# Patient Record
Sex: Male | Born: 1935 | Race: White | Hispanic: No | Marital: Married | Smoking: Former smoker
Health system: Southern US, Community
[De-identification: ages and names within clinical notes are randomized; demographics above are authoritative.]

## PROBLEM LIST (undated history)

## (undated) DIAGNOSIS — C801 Malignant (primary) neoplasm, unspecified: Secondary | ICD-10-CM

## (undated) DIAGNOSIS — L409 Psoriasis, unspecified: Secondary | ICD-10-CM

## (undated) DIAGNOSIS — R0600 Dyspnea, unspecified: Secondary | ICD-10-CM

## (undated) DIAGNOSIS — F0390 Unspecified dementia without behavioral disturbance: Secondary | ICD-10-CM

## (undated) DIAGNOSIS — J189 Pneumonia, unspecified organism: Secondary | ICD-10-CM

## (undated) DIAGNOSIS — E119 Type 2 diabetes mellitus without complications: Secondary | ICD-10-CM

## (undated) DIAGNOSIS — A159 Respiratory tuberculosis unspecified: Secondary | ICD-10-CM

## (undated) HISTORY — PX: CATARACT EXTRACTION: SUR2

## (undated) HISTORY — DX: Malignant (primary) neoplasm, unspecified: C80.1

---

## 2005-02-05 ENCOUNTER — Ambulatory Visit: Payer: Self-pay | Admitting: Gastroenterology

## 2005-02-05 LAB — HM COLONOSCOPY

## 2011-06-19 DIAGNOSIS — Z0000 Encounter for general adult medical examination without abnormal findings: Secondary | ICD-10-CM | POA: Diagnosis not present

## 2011-06-19 DIAGNOSIS — Z23 Encounter for immunization: Secondary | ICD-10-CM | POA: Diagnosis not present

## 2011-06-19 DIAGNOSIS — M129 Arthropathy, unspecified: Secondary | ICD-10-CM | POA: Diagnosis not present

## 2011-06-19 DIAGNOSIS — R5381 Other malaise: Secondary | ICD-10-CM | POA: Diagnosis not present

## 2011-06-19 DIAGNOSIS — Z1339 Encounter for screening examination for other mental health and behavioral disorders: Secondary | ICD-10-CM | POA: Diagnosis not present

## 2011-06-19 DIAGNOSIS — R5383 Other fatigue: Secondary | ICD-10-CM | POA: Diagnosis not present

## 2011-06-19 DIAGNOSIS — Z1331 Encounter for screening for depression: Secondary | ICD-10-CM | POA: Diagnosis not present

## 2011-08-08 DIAGNOSIS — G3184 Mild cognitive impairment, so stated: Secondary | ICD-10-CM | POA: Diagnosis not present

## 2011-08-08 DIAGNOSIS — R5383 Other fatigue: Secondary | ICD-10-CM | POA: Diagnosis not present

## 2011-08-08 DIAGNOSIS — R05 Cough: Secondary | ICD-10-CM | POA: Diagnosis not present

## 2011-08-08 DIAGNOSIS — J209 Acute bronchitis, unspecified: Secondary | ICD-10-CM | POA: Diagnosis not present

## 2011-08-08 DIAGNOSIS — R5381 Other malaise: Secondary | ICD-10-CM | POA: Diagnosis not present

## 2011-12-17 DIAGNOSIS — M129 Arthropathy, unspecified: Secondary | ICD-10-CM | POA: Diagnosis not present

## 2011-12-17 DIAGNOSIS — E559 Vitamin D deficiency, unspecified: Secondary | ICD-10-CM | POA: Diagnosis not present

## 2011-12-17 DIAGNOSIS — R5381 Other malaise: Secondary | ICD-10-CM | POA: Diagnosis not present

## 2011-12-17 DIAGNOSIS — G3184 Mild cognitive impairment, so stated: Secondary | ICD-10-CM | POA: Diagnosis not present

## 2011-12-17 DIAGNOSIS — R5383 Other fatigue: Secondary | ICD-10-CM | POA: Diagnosis not present

## 2012-01-03 DIAGNOSIS — R5381 Other malaise: Secondary | ICD-10-CM | POA: Diagnosis not present

## 2012-01-03 DIAGNOSIS — G3184 Mild cognitive impairment, so stated: Secondary | ICD-10-CM | POA: Diagnosis not present

## 2012-01-03 DIAGNOSIS — E119 Type 2 diabetes mellitus without complications: Secondary | ICD-10-CM | POA: Diagnosis not present

## 2012-01-03 DIAGNOSIS — Z23 Encounter for immunization: Secondary | ICD-10-CM | POA: Diagnosis not present

## 2012-01-03 DIAGNOSIS — E559 Vitamin D deficiency, unspecified: Secondary | ICD-10-CM | POA: Diagnosis not present

## 2012-01-28 ENCOUNTER — Ambulatory Visit: Payer: Self-pay | Admitting: Family Medicine

## 2012-01-28 DIAGNOSIS — Z7189 Other specified counseling: Secondary | ICD-10-CM | POA: Diagnosis not present

## 2012-01-28 DIAGNOSIS — E119 Type 2 diabetes mellitus without complications: Secondary | ICD-10-CM | POA: Diagnosis not present

## 2012-01-30 DIAGNOSIS — E119 Type 2 diabetes mellitus without complications: Secondary | ICD-10-CM | POA: Diagnosis not present

## 2012-02-01 ENCOUNTER — Ambulatory Visit: Payer: Self-pay | Admitting: Family Medicine

## 2012-03-03 ENCOUNTER — Ambulatory Visit: Payer: Self-pay | Admitting: Family Medicine

## 2012-03-03 DIAGNOSIS — Z7189 Other specified counseling: Secondary | ICD-10-CM | POA: Diagnosis not present

## 2012-03-03 DIAGNOSIS — E119 Type 2 diabetes mellitus without complications: Secondary | ICD-10-CM | POA: Diagnosis not present

## 2012-04-02 ENCOUNTER — Ambulatory Visit: Payer: Self-pay | Admitting: Family Medicine

## 2012-04-28 DIAGNOSIS — N400 Enlarged prostate without lower urinary tract symptoms: Secondary | ICD-10-CM | POA: Diagnosis not present

## 2012-04-28 DIAGNOSIS — E559 Vitamin D deficiency, unspecified: Secondary | ICD-10-CM | POA: Diagnosis not present

## 2012-04-28 DIAGNOSIS — R5381 Other malaise: Secondary | ICD-10-CM | POA: Diagnosis not present

## 2012-04-28 DIAGNOSIS — E119 Type 2 diabetes mellitus without complications: Secondary | ICD-10-CM | POA: Diagnosis not present

## 2012-04-28 DIAGNOSIS — E7800 Pure hypercholesterolemia, unspecified: Secondary | ICD-10-CM | POA: Diagnosis not present

## 2012-06-24 DIAGNOSIS — H11449 Conjunctival cysts, unspecified eye: Secondary | ICD-10-CM | POA: Diagnosis not present

## 2012-06-26 DIAGNOSIS — H11449 Conjunctival cysts, unspecified eye: Secondary | ICD-10-CM | POA: Diagnosis not present

## 2012-07-03 DIAGNOSIS — H11449 Conjunctival cysts, unspecified eye: Secondary | ICD-10-CM | POA: Diagnosis not present

## 2012-09-26 DIAGNOSIS — H11449 Conjunctival cysts, unspecified eye: Secondary | ICD-10-CM | POA: Diagnosis not present

## 2012-10-07 ENCOUNTER — Ambulatory Visit: Payer: Self-pay | Admitting: Ophthalmology

## 2012-10-07 ENCOUNTER — Other Ambulatory Visit: Payer: Self-pay | Admitting: Ophthalmology

## 2012-10-07 DIAGNOSIS — Z01810 Encounter for preprocedural cardiovascular examination: Secondary | ICD-10-CM | POA: Diagnosis not present

## 2012-10-07 DIAGNOSIS — Z029 Encounter for administrative examinations, unspecified: Secondary | ICD-10-CM | POA: Diagnosis not present

## 2012-10-07 DIAGNOSIS — I10 Essential (primary) hypertension: Secondary | ICD-10-CM | POA: Diagnosis not present

## 2012-10-09 DIAGNOSIS — Z1339 Encounter for screening examination for other mental health and behavioral disorders: Secondary | ICD-10-CM | POA: Diagnosis not present

## 2012-10-09 DIAGNOSIS — E119 Type 2 diabetes mellitus without complications: Secondary | ICD-10-CM | POA: Diagnosis not present

## 2012-10-09 DIAGNOSIS — N400 Enlarged prostate without lower urinary tract symptoms: Secondary | ICD-10-CM | POA: Diagnosis not present

## 2012-10-09 DIAGNOSIS — Z1331 Encounter for screening for depression: Secondary | ICD-10-CM | POA: Diagnosis not present

## 2012-10-09 DIAGNOSIS — R5381 Other malaise: Secondary | ICD-10-CM | POA: Diagnosis not present

## 2012-10-09 DIAGNOSIS — Z0000 Encounter for general adult medical examination without abnormal findings: Secondary | ICD-10-CM | POA: Diagnosis not present

## 2012-10-29 DIAGNOSIS — H11449 Conjunctival cysts, unspecified eye: Secondary | ICD-10-CM | POA: Diagnosis not present

## 2012-10-30 DIAGNOSIS — H11449 Conjunctival cysts, unspecified eye: Secondary | ICD-10-CM | POA: Diagnosis not present

## 2012-12-31 DIAGNOSIS — H2510 Age-related nuclear cataract, unspecified eye: Secondary | ICD-10-CM | POA: Diagnosis not present

## 2013-01-14 DIAGNOSIS — R7309 Other abnormal glucose: Secondary | ICD-10-CM | POA: Diagnosis not present

## 2013-01-14 DIAGNOSIS — E119 Type 2 diabetes mellitus without complications: Secondary | ICD-10-CM | POA: Diagnosis not present

## 2013-01-14 DIAGNOSIS — F0280 Dementia in other diseases classified elsewhere without behavioral disturbance: Secondary | ICD-10-CM | POA: Diagnosis not present

## 2013-01-14 DIAGNOSIS — Z1331 Encounter for screening for depression: Secondary | ICD-10-CM | POA: Diagnosis not present

## 2013-01-14 DIAGNOSIS — Z23 Encounter for immunization: Secondary | ICD-10-CM | POA: Diagnosis not present

## 2013-07-15 DIAGNOSIS — E7800 Pure hypercholesterolemia, unspecified: Secondary | ICD-10-CM | POA: Diagnosis not present

## 2013-07-15 DIAGNOSIS — E039 Hypothyroidism, unspecified: Secondary | ICD-10-CM | POA: Diagnosis not present

## 2013-07-15 DIAGNOSIS — G3184 Mild cognitive impairment, so stated: Secondary | ICD-10-CM | POA: Diagnosis not present

## 2013-07-15 DIAGNOSIS — E119 Type 2 diabetes mellitus without complications: Secondary | ICD-10-CM | POA: Diagnosis not present

## 2013-07-15 DIAGNOSIS — M129 Arthropathy, unspecified: Secondary | ICD-10-CM | POA: Diagnosis not present

## 2013-07-15 DIAGNOSIS — Z23 Encounter for immunization: Secondary | ICD-10-CM | POA: Diagnosis not present

## 2013-07-15 DIAGNOSIS — E559 Vitamin D deficiency, unspecified: Secondary | ICD-10-CM | POA: Diagnosis not present

## 2013-07-15 LAB — TSH: TSH: 2.61 u[IU]/mL (ref 0.41–5.90)

## 2013-07-15 LAB — LIPID PANEL
Cholesterol: 175 mg/dL (ref 0–200)
HDL: 36 mg/dL (ref 35–70)
LDL CALC: 96 mg/dL
LDl/HDL Ratio: 2.7
TRIGLYCERIDES: 217 mg/dL — AB (ref 40–160)

## 2013-07-15 LAB — CBC AND DIFFERENTIAL
HCT: 44 % (ref 41–53)
HEMOGLOBIN: 15.1 g/dL (ref 13.5–17.5)
NEUTROS ABS: 6 /uL
PLATELETS: 230 10*3/uL (ref 150–399)
WBC: 8.9 10*3/mL

## 2013-10-16 DIAGNOSIS — E039 Hypothyroidism, unspecified: Secondary | ICD-10-CM | POA: Diagnosis not present

## 2013-10-16 DIAGNOSIS — J069 Acute upper respiratory infection, unspecified: Secondary | ICD-10-CM | POA: Diagnosis not present

## 2013-10-16 DIAGNOSIS — M129 Arthropathy, unspecified: Secondary | ICD-10-CM | POA: Diagnosis not present

## 2013-10-16 DIAGNOSIS — G3184 Mild cognitive impairment, so stated: Secondary | ICD-10-CM | POA: Diagnosis not present

## 2013-10-16 DIAGNOSIS — Z23 Encounter for immunization: Secondary | ICD-10-CM | POA: Diagnosis not present

## 2013-10-16 DIAGNOSIS — E559 Vitamin D deficiency, unspecified: Secondary | ICD-10-CM | POA: Diagnosis not present

## 2013-11-16 DIAGNOSIS — E559 Vitamin D deficiency, unspecified: Secondary | ICD-10-CM | POA: Diagnosis not present

## 2013-11-16 DIAGNOSIS — E119 Type 2 diabetes mellitus without complications: Secondary | ICD-10-CM | POA: Diagnosis not present

## 2013-11-16 DIAGNOSIS — Z23 Encounter for immunization: Secondary | ICD-10-CM | POA: Diagnosis not present

## 2013-11-16 DIAGNOSIS — M129 Arthropathy, unspecified: Secondary | ICD-10-CM | POA: Diagnosis not present

## 2013-11-16 DIAGNOSIS — E039 Hypothyroidism, unspecified: Secondary | ICD-10-CM | POA: Diagnosis not present

## 2013-11-16 DIAGNOSIS — G3184 Mild cognitive impairment, so stated: Secondary | ICD-10-CM | POA: Diagnosis not present

## 2013-12-29 DIAGNOSIS — E119 Type 2 diabetes mellitus without complications: Secondary | ICD-10-CM | POA: Diagnosis not present

## 2013-12-29 DIAGNOSIS — M129 Arthropathy, unspecified: Secondary | ICD-10-CM | POA: Diagnosis not present

## 2013-12-29 DIAGNOSIS — E039 Hypothyroidism, unspecified: Secondary | ICD-10-CM | POA: Diagnosis not present

## 2013-12-29 DIAGNOSIS — Z23 Encounter for immunization: Secondary | ICD-10-CM | POA: Diagnosis not present

## 2013-12-29 DIAGNOSIS — G3184 Mild cognitive impairment, so stated: Secondary | ICD-10-CM | POA: Diagnosis not present

## 2013-12-29 DIAGNOSIS — E559 Vitamin D deficiency, unspecified: Secondary | ICD-10-CM | POA: Diagnosis not present

## 2014-02-04 DIAGNOSIS — J40 Bronchitis, not specified as acute or chronic: Secondary | ICD-10-CM | POA: Diagnosis not present

## 2014-03-18 DIAGNOSIS — Z23 Encounter for immunization: Secondary | ICD-10-CM | POA: Diagnosis not present

## 2014-03-18 DIAGNOSIS — Z1389 Encounter for screening for other disorder: Secondary | ICD-10-CM | POA: Diagnosis not present

## 2014-03-18 DIAGNOSIS — Z0000 Encounter for general adult medical examination without abnormal findings: Secondary | ICD-10-CM | POA: Diagnosis not present

## 2014-04-15 DIAGNOSIS — H10812 Pingueculitis, left eye: Secondary | ICD-10-CM | POA: Diagnosis not present

## 2014-06-14 DIAGNOSIS — G3184 Mild cognitive impairment, so stated: Secondary | ICD-10-CM | POA: Diagnosis not present

## 2014-06-14 DIAGNOSIS — E669 Obesity, unspecified: Secondary | ICD-10-CM | POA: Diagnosis not present

## 2014-06-14 DIAGNOSIS — Z1389 Encounter for screening for other disorder: Secondary | ICD-10-CM | POA: Diagnosis not present

## 2014-06-14 DIAGNOSIS — E119 Type 2 diabetes mellitus without complications: Secondary | ICD-10-CM | POA: Diagnosis not present

## 2014-06-14 LAB — HEPATIC FUNCTION PANEL
ALT: 21 U/L (ref 10–40)
AST: 19 U/L (ref 14–40)
Alkaline Phosphatase: 51 U/L (ref 25–125)

## 2014-06-14 LAB — BASIC METABOLIC PANEL
BUN: 20 mg/dL (ref 4–21)
Creatinine: 1.2 mg/dL (ref 0.6–1.3)
Glucose: 210 mg/dL
Potassium: 5.5 mmol/L — AB (ref 3.4–5.3)
Sodium: 142 mmol/L (ref 137–147)

## 2014-06-14 LAB — HEMOGLOBIN A1C: HEMOGLOBIN A1C: 7.2 % — AB (ref 4.0–6.0)

## 2014-06-16 DIAGNOSIS — Z1389 Encounter for screening for other disorder: Secondary | ICD-10-CM | POA: Diagnosis not present

## 2014-06-16 DIAGNOSIS — E669 Obesity, unspecified: Secondary | ICD-10-CM | POA: Diagnosis not present

## 2014-06-16 DIAGNOSIS — L0390 Cellulitis, unspecified: Secondary | ICD-10-CM | POA: Diagnosis not present

## 2014-06-25 DIAGNOSIS — S00512A Abrasion of oral cavity, initial encounter: Secondary | ICD-10-CM | POA: Diagnosis not present

## 2014-06-25 DIAGNOSIS — Z1389 Encounter for screening for other disorder: Secondary | ICD-10-CM | POA: Diagnosis not present

## 2014-06-25 DIAGNOSIS — E669 Obesity, unspecified: Secondary | ICD-10-CM | POA: Diagnosis not present

## 2014-08-18 DIAGNOSIS — E119 Type 2 diabetes mellitus without complications: Secondary | ICD-10-CM | POA: Diagnosis not present

## 2014-12-05 ENCOUNTER — Other Ambulatory Visit: Payer: Self-pay | Admitting: Family Medicine

## 2014-12-09 DIAGNOSIS — E7800 Pure hypercholesterolemia, unspecified: Secondary | ICD-10-CM | POA: Insufficient documentation

## 2014-12-09 DIAGNOSIS — E039 Hypothyroidism, unspecified: Secondary | ICD-10-CM | POA: Insufficient documentation

## 2014-12-09 DIAGNOSIS — F0280 Dementia in other diseases classified elsewhere without behavioral disturbance: Secondary | ICD-10-CM | POA: Insufficient documentation

## 2014-12-09 DIAGNOSIS — M1990 Unspecified osteoarthritis, unspecified site: Secondary | ICD-10-CM | POA: Insufficient documentation

## 2014-12-09 DIAGNOSIS — H409 Unspecified glaucoma: Secondary | ICD-10-CM | POA: Insufficient documentation

## 2014-12-09 DIAGNOSIS — D126 Benign neoplasm of colon, unspecified: Secondary | ICD-10-CM | POA: Insufficient documentation

## 2014-12-09 DIAGNOSIS — G309 Alzheimer's disease, unspecified: Secondary | ICD-10-CM

## 2014-12-09 DIAGNOSIS — E559 Vitamin D deficiency, unspecified: Secondary | ICD-10-CM | POA: Insufficient documentation

## 2014-12-09 DIAGNOSIS — E119 Type 2 diabetes mellitus without complications: Secondary | ICD-10-CM | POA: Insufficient documentation

## 2014-12-09 DIAGNOSIS — N400 Enlarged prostate without lower urinary tract symptoms: Secondary | ICD-10-CM | POA: Insufficient documentation

## 2014-12-09 DIAGNOSIS — E669 Obesity, unspecified: Secondary | ICD-10-CM | POA: Insufficient documentation

## 2014-12-13 ENCOUNTER — Ambulatory Visit (INDEPENDENT_AMBULATORY_CARE_PROVIDER_SITE_OTHER): Payer: Medicare Other | Admitting: Family Medicine

## 2014-12-13 ENCOUNTER — Encounter: Payer: Self-pay | Admitting: Family Medicine

## 2014-12-13 VITALS — BP 122/64 | HR 78 | Temp 97.8°F | Resp 16 | Wt 203.0 lb

## 2014-12-13 DIAGNOSIS — G309 Alzheimer's disease, unspecified: Secondary | ICD-10-CM

## 2014-12-13 DIAGNOSIS — Z23 Encounter for immunization: Secondary | ICD-10-CM | POA: Diagnosis not present

## 2014-12-13 DIAGNOSIS — F0280 Dementia in other diseases classified elsewhere without behavioral disturbance: Secondary | ICD-10-CM

## 2014-12-13 DIAGNOSIS — E119 Type 2 diabetes mellitus without complications: Secondary | ICD-10-CM

## 2014-12-13 LAB — POCT GLYCOSYLATED HEMOGLOBIN (HGB A1C): Hemoglobin A1C: 8.5

## 2014-12-13 NOTE — Progress Notes (Signed)
Patient ID: Joel Grant, male   DOB: 08/01/1926, 81 y.o.   MRN: [id number]    Subjective:  HPI  Diabetes Mellitus Type II, Follow-up:   Lab Results  Component Value Date   HGBA1C 7.2* 06/14/2014    Last seen for diabetes 6 months ago.  Management since then includes none. He reports good compliance with treatment. He is not having side effects.  Current symptoms include none. Home blood sugar records: have been running in the 200's the past couple of weeks.  Episodes of hypoglycemia? no   Current Insulin Regimen: n/a Last eye exam: Within the last year Current exercise: none  Pertinent Labs:    Component Value Date/Time   CHOL 175 07/15/2013   TRIG 217* 07/15/2013   CREATININE 1.2 06/14/2014    Wt Readings from Last 3 Encounters:  12/13/14 203 lb (92.08 kg)  06/25/14 206 lb (93.441 kg)    ------------------------------------------------------------------------  Pt wife reports that his memory is about the same as it was 6 months ago. When doing the [place] pt did not only not remember the name and street address, but he did not even remember me telling him one. His last CIT6 was done on 03/18/14 and he scored a 14, today he scored a 22.   Cognitive Testing - 6-CIT  Correct? Score   What year is it? no 4 0 or 4  What month is it? no 3 0 or 3  Memorize:    Joel Grant,  44,  [place],      What time is it? (within 1 hour) yes 0 0 or 3  Count backwards from 20 no 2 0, 2, or 4  Name the months of the year no 3 0, 2, or 4  Repeat name & address above no 10 0, 2, 4, 6, 8, or 10       TOTAL SCORE  22/28   Interpretation:  Abnormal  Normal (0-7) Abnormal (8-28)      Prior to Admission medications   Medication Sig Start Date End Date Taking? Authorizing Provider  glucose blood (FREESTYLE LITE) test strip FREESTYLE LITE TEST (In Vitro Strip)  1 (one) Strip Strip to check sugars  two times daily for 0 days  Quantity: 100;  Refills: 12   Ordered  :03-Jun-2014  Margarita Rana MD;  Started 03-Jun-2014 Active Comments: QS 30 days supply DX E11.9 06/03/14  Yes Historical Provider, MD    Patient Active Problem List   Diagnosis Date Noted  . Adenomatous colon polyp 12/09/2014  . Alzheimer's dementia 12/09/2014  . 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 Diabetes 12/09/2014  . Glaucoma 12/09/2014  . Adult hypothyroidism 12/09/2014  . Adiposity 12/09/2014  . Arthritis, degenerative 12/09/2014  . Hypercholesterolemia without hypertriglyceridemia 12/09/2014  . Avitaminosis D 12/09/2014    History reviewed. No pertinent past medical history.  Social History   Social History  . Marital Status: Married    Spouse Name: N/A  . Number of Children: N/A  . Years of Education: N/A   Occupational History  . Not on file.   Social History Main Topics  . Smoking status: Former Research scientist (life sciences)  . Smokeless tobacco: Not on file  . Alcohol Use: No  . Drug Use: No  . Sexual Activity: Not on file   Other Topics Concern  . Not on file   Social History Narrative    Allergies  Allergen Reactions  . Ciprofloxacin Anaphylaxis  . Hydrocodone-Acetaminophen Nausea And Vomiting  .  Penicillins   . Sulfa Antibiotics     Review of Systems  Constitutional: Negative.   HENT: Negative.   Eyes: Negative.   Respiratory: Negative.   Cardiovascular: Negative.   Gastrointestinal: Negative.   Genitourinary: Negative.   Musculoskeletal: Negative.   Skin: Negative.   Neurological: Negative.   Endo/Heme/Allergies: Negative.   Psychiatric/Behavioral: Positive for memory loss.    Immunization History  Administered Date(s) Administered  . Pneumococcal Conjugate-13 03/18/2014  . Pneumococcal Polysaccharide-23 01/14/2013  . Td 11/02/2002   Objective:  BP 122/64 mmHg  Pulse 78  Temp(Src) 97.8 F (36.6 C) (Oral)  Resp 16  Wt 203 lb (92.08 kg)  Physical Exam  Constitutional: He is oriented to person, place, and time and well-developed,  well-nourished, and in no distress.  HENT:  Head: Normocephalic and atraumatic.  Right Ear: External ear normal.  Left Ear: External ear normal.  Nose: Nose normal.  Eyes: Conjunctivae are normal.  Neck: Neck supple.  Cardiovascular: Normal rate, regular rhythm and normal heart sounds.   Pulmonary/Chest: Effort normal and breath sounds normal.  Abdominal: Soft.  Neurological: He is alert and oriented to person, place, and time.  Skin: Skin is warm and dry.  Psychiatric: Mood, memory, affect and judgment normal.    Lab Results  Component Value Date   WBC 8.9 07/15/2013   HGB 15.1 07/15/2013   HCT 44 07/15/2013   PLT 230 07/15/2013   CHOL 175 07/15/2013   TRIG 217* 07/15/2013   HDL 36 07/15/2013   LDLCALC 96 07/15/2013   TSH 2.61 07/15/2013   HGBA1C 7.2* 06/14/2014    CMP     Component Value Date/Time   NA 142 06/14/2014   K 5.5* 06/14/2014   BUN 20 06/14/2014   CREATININE 1.2 06/14/2014   AST 19 06/14/2014   ALT 21 06/14/2014   ALKPHOS 51 06/14/2014    Assessment and Plan :  1. Type 2 diabetes mellitus without complication At 81 years old with good of impairment, early dementia goal A1c is less than 8.5 but mainly we have a goal to avoid hypoglycemia. This is explained to patient and to his wife. - POCT HgB A1C--8.5 today  2. Alzheimer's dementia Progressive. On 6-CIT day patient's scores 22/28 which indicates significant dementia. I have told the patient and his wife he is not to drive anymore. He just went to the [facility] driver's license. If I find out he is driving I will call the DMV. I'm most concerned about its executive functioning which is not good. Ambulation reflexes today are fine. His understanding of situation is getting worse. More than 50% of today's visit is spent in counseling regarding these conditions. 3. Need for influenza vaccination  - Flu vaccine HIGH DOSE PF   Miguel Aschoff MD [facility] 12/13/2014  8:39 AM

## 2014-12-20 ENCOUNTER — Other Ambulatory Visit: Payer: Self-pay

## 2014-12-20 MED ORDER — METFORMIN HCL 500 MG PO TABS: 500.0000 mg | ORAL_TABLET | Freq: Every day | ORAL | Status: DC

## 2015-03-14 ENCOUNTER — Ambulatory Visit (INDEPENDENT_AMBULATORY_CARE_PROVIDER_SITE_OTHER): Payer: Medicare Other | Admitting: Family Medicine

## 2015-03-14 VITALS — BP 124/62 | HR 96 | Temp 98.5°F | Resp 14 | Wt 208.0 lb

## 2015-03-14 DIAGNOSIS — J069 Acute upper respiratory infection, unspecified: Secondary | ICD-10-CM | POA: Diagnosis not present

## 2015-03-14 NOTE — Progress Notes (Signed)
Patient ID: Joel Grant, male   DOB: 1926/04/15, 81 y.o.   MRN: [id number]   Joel Grant  MRN: [id number] DOB: 18-Feb-1927  Subjective:  HPI   1. Upper respiratory infection Patient has had nasal congestion for 3 days.  His wife reports that he had low grade fever last night and this morning, 99.1.  He denies any cough, however he coughed a few times during questioning.  Wife states that they went to the pharmacy yesterday and they recommended he use Allegra, he has only had 1 dose.    Patient Active Problem List   Diagnosis Date Noted  . Adenomatous colon polyp 12/09/2014  . Alzheimer's dementia 12/09/2014  . 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 Diabetes ([place]) 12/09/2014  . Glaucoma 12/09/2014  . Adult hypothyroidism 12/09/2014  . Adiposity 12/09/2014  . Arthritis, degenerative 12/09/2014  . Hypercholesterolemia without hypertriglyceridemia 12/09/2014  . Avitaminosis D 12/09/2014    No past medical history on file.  Social History   Social History  . Marital Status: Married    Spouse Name: N/A  . Number of Children: N/A  . Years of Education: N/A   Occupational History  . Not on file.   Social History Main Topics  . Smoking status: Former Research scientist (life sciences)  . Smokeless tobacco: Not on file  . Alcohol Use: No  . Drug Use: No  . Sexual Activity: Not on file   Other Topics Concern  . Not on file   Social History Narrative    Outpatient Prescriptions Prior to Visit  Medication Sig Dispense Refill  . glucose blood (FREESTYLE LITE) test strip FREESTYLE LITE TEST (In Vitro Strip)  1 (one) Strip Strip to check sugars  two times daily for 0 days  Quantity: 100;  Refills: 12   Ordered :03-Jun-2014  Margarita Rana MD;  Started 03-Jun-2014 Active Comments: QS 30 days supply DX E11.9    . metFORMIN (GLUCOPHAGE) 500 MG tablet Take 1 tablet (500 mg total) by mouth daily after supper. 90 tablet 3   No facility-administered medications prior to visit.    Allergies    Allergen Reactions  . Ciprofloxacin Anaphylaxis  . Hydrocodone-Acetaminophen Nausea And Vomiting  . Penicillins   . Sulfa Antibiotics     Review of Systems  Constitutional: Positive for fever. Negative for chills and diaphoresis.  Eyes: Negative.   Respiratory: Positive for cough. Negative for sputum production, shortness of breath and wheezing.   Cardiovascular: Negative for chest pain, palpitations, claudication and leg swelling.  Gastrointestinal: Negative.   Neurological: Negative for weakness and headaches.  Psychiatric/Behavioral: Negative.    Objective:  BP 124/62 mmHg  Pulse 96  Temp(Src) 98.5 F (36.9 C) (Oral)  Resp 14  Wt 208 lb (94.348 kg)  Physical Exam  Constitutional: He is well-developed, well-nourished, and in no distress.  HENT:  Head: Normocephalic and atraumatic.  Right Ear: External ear normal.  Left Ear: External ear normal.  Nose: Nose normal.  Mouth/Throat: No oropharyngeal exudate.  Nose actively running with clear watery rhinorrhea  Neck: Neck supple.  Cardiovascular: Normal rate, regular rhythm and normal heart sounds.   Pulmonary/Chest: Effort normal and breath sounds normal.  Abdominal: Soft.  Neurological: He is alert. Gait normal.  Skin: Skin is warm and dry.  Psychiatric: Mood, memory, affect and judgment normal.    Assessment and Plan :  Upper respiratory infection/"Head Cold" Patient advised symptoms were probably last 2 weeks or more. He is to call us if he clinically worsens. Wife  is advised of this as he has early dementia.  Miguel Aschoff MD [facility] 03/14/2015 10:18 AM

## 2015-03-31 ENCOUNTER — Ambulatory Visit (INDEPENDENT_AMBULATORY_CARE_PROVIDER_SITE_OTHER): Payer: Medicare Other | Admitting: Family Medicine

## 2015-03-31 ENCOUNTER — Encounter: Payer: Self-pay | Admitting: Family Medicine

## 2015-03-31 VITALS — BP 132/70 | HR 88 | Temp 98.7°F | Resp 16 | Wt 205.0 lb

## 2015-03-31 DIAGNOSIS — R0981 Nasal congestion: Secondary | ICD-10-CM | POA: Diagnosis not present

## 2015-03-31 MED ORDER — IPRATROPIUM BROMIDE 0.06 % NA SOLN: 2.0000 | Freq: Four times a day (QID) | NASAL | Status: DC

## 2015-03-31 NOTE — Progress Notes (Signed)
Subjective:     Patient ID: Joel Grant, male   DOB: [birth year], 81 y.o.   MRN: [id number]  HPI  Chief Complaint  Patient presents with  . URI    X 2 weeks. Patient reports that he has a constant runny nose. He has been taking OTC meds such as Allegra which hasn't helped much.   Was here 12/12 for URI sx. Reports (wife is primary historian) that he has persistent watery sinus drip (confirmed by having him blow his nose in the office). Unclear whether he had brief period of wellness prior to continuation of nasal sx.   Review of Systems  Constitutional: Negative for fever and chills.  Respiratory: Positive for cough (not present during office visit).        Objective:   Physical Exam  Constitutional: He appears well-developed and well-nourished. No distress.  Ears: T.M's intact without inflammation Sinuses: non-tender Throat: no tonsillar enlargement or exudate Neck: no cervical adenopathy Lungs: clear     Assessment:    1. Nasal sinus congestion - ipratropium (ATROVENT) 0.06 % nasal spray; Place 2 sprays into both nostrils 4 (four) times daily. As needed for runny nose  Dispense: 15 mL; Refill: 1    Plan:    Call if not improving or see purulent sinus drainage

## 2015-03-31 NOTE — Patient Instructions (Signed)
Call if you don't improve or see green/yellow nasal drainage

## 2015-04-18 ENCOUNTER — Encounter: Payer: Self-pay | Admitting: Family Medicine

## 2015-04-18 ENCOUNTER — Ambulatory Visit (INDEPENDENT_AMBULATORY_CARE_PROVIDER_SITE_OTHER): Payer: Medicare Other | Admitting: Family Medicine

## 2015-04-18 VITALS — BP 128/70 | HR 86 | Temp 97.8°F | Resp 16 | Wt 206.0 lb

## 2015-04-18 DIAGNOSIS — G309 Alzheimer's disease, unspecified: Secondary | ICD-10-CM

## 2015-04-18 DIAGNOSIS — E119 Type 2 diabetes mellitus without complications: Secondary | ICD-10-CM | POA: Diagnosis not present

## 2015-04-18 DIAGNOSIS — F0280 Dementia in other diseases classified elsewhere without behavioral disturbance: Secondary | ICD-10-CM | POA: Diagnosis not present

## 2015-04-18 DIAGNOSIS — E7800 Pure hypercholesterolemia, unspecified: Secondary | ICD-10-CM

## 2015-04-18 LAB — POCT GLYCOSYLATED HEMOGLOBIN (HGB A1C): HEMOGLOBIN A1C: 8.6

## 2015-04-18 NOTE — Progress Notes (Signed)
Patient ID: Joel Grant, male   DOB: 1926/08/18, 82 y.o.   MRN: [id number]    Subjective:  HPI  Diabetes Mellitus Type II, Follow-up:   Lab Results  Component Value Date   HGBA1C 8.5 12/13/2014   HGBA1C 7.2* 06/14/2014    Last seen for diabetes 4 months ago.  Management since then includes none. He reports good compliance with treatment. He is not having side effects.  Current symptoms include none. Home blood sugar records: 170-180's fasting  Episodes of hypoglycemia? no   Current Insulin Regimen: n/a Most Recent Eye Exam: 1-1.5 years ago Weight trend: stable Current exercise: none  Pertinent Labs:    Component Value Date/Time   CHOL 175 07/15/2013   TRIG 217* 07/15/2013   HDL 36 07/15/2013   LDLCALC 96 07/15/2013   CREATININE 1.2 06/14/2014    Wt Readings from Last 3 Encounters:  04/18/15 206 lb (93.441 kg)  03/31/15 205 lb (92.987 kg)  03/14/15 208 lb (94.348 kg)    ------------------------------------------------------------------------ Pt wife reports that that patients memory has not gotten any better and has gotten slightly worse. He is still driving some, in familiar places. (LOV was told not to drive)    Prior to Admission medications   Medication Sig Start Date End Date Taking? Authorizing Provider  glucose blood (FREESTYLE LITE) test strip FREESTYLE LITE TEST (In Vitro Strip)  1 (one) Strip Strip to check sugars  two times daily for 0 days  Quantity: 100;  Refills: 12   Ordered :03-Jun-2014  Margarita Rana MD;  Started 03-Jun-2014 Active Comments: QS 30 days supply DX E11.9 06/03/14  Yes Historical Provider, MD  metFORMIN (GLUCOPHAGE) 500 MG tablet Take 1 tablet (500 mg total) by mouth daily after supper. 12/20/14  Yes Richard Maceo Pro., MD    Patient Active Problem List   Diagnosis Date Noted  . Adenomatous colon polyp 12/09/2014  . Alzheimer's dementia 12/09/2014  . 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 Diabetes ([place]) 12/09/2014  .  Glaucoma 12/09/2014  . Adult hypothyroidism 12/09/2014  . Adiposity 12/09/2014  . Arthritis, degenerative 12/09/2014  . Hypercholesterolemia without hypertriglyceridemia 12/09/2014  . Avitaminosis D 12/09/2014    History reviewed. No pertinent past medical history.  Social History   Social History  . Marital Status: Married    Spouse Name: N/A  . Number of Children: N/A  . Years of Education: N/A   Occupational History  . Not on file.   Social History Main Topics  . Smoking status: Former Research scientist (life sciences)  . Smokeless tobacco: Not on file  . Alcohol Use: No  . Drug Use: No  . Sexual Activity: Not on file   Other Topics Concern  . Not on file   Social History Narrative    Allergies  Allergen Reactions  . Ciprofloxacin Anaphylaxis  . Hydrocodone-Acetaminophen Nausea And Vomiting  . Penicillins   . Sulfa Antibiotics     Review of Systems  Constitutional: Negative.   HENT: Negative.   Eyes: Negative.   Respiratory: Negative.   Cardiovascular: Negative.   Gastrointestinal: Negative.   Genitourinary: Negative.   Musculoskeletal: Negative.   Skin: Negative.   Neurological: Negative.   Endo/Heme/Allergies: Negative.   Psychiatric/Behavioral: Negative.     Immunization History  Administered Date(s) Administered  . Influenza, High Dose Seasonal PF 12/13/2014  . Pneumococcal Conjugate-13 03/18/2014  . Pneumococcal Polysaccharide-23 01/14/2013  . Td 11/02/2002   Objective:  BP 128/70 mmHg  Pulse 86  Temp(Src) 97.8 F (36.6 C) (Oral)  Resp 16  Wt 206 lb (93.441 kg)  Physical Exam  Constitutional: He is oriented to person, place, and time and well-developed, well-nourished, and in no distress.  Eyes: Conjunctivae and EOM are normal. Pupils are equal, round, and reactive to light.  Neck: Normal range of motion. Neck supple.  Cardiovascular: Normal rate, regular rhythm, normal heart sounds and intact distal pulses.   Pulmonary/Chest: Effort normal and breath sounds  normal.  Abdominal: Soft. Bowel sounds are normal.  Musculoskeletal: Normal range of motion.  Neurological: He is alert and oriented to person, place, and time. He has normal reflexes. Gait normal. GCS score is 15.  Skin: Skin is warm and dry.  Psychiatric: Mood, affect and judgment normal.   Diabetic Foot Exam - Simple   Simple Foot Form  Diabetic Foot exam was performed with the following findings:  Yes 04/18/2015  9:17 AM  Visual Inspection  Sensation Testing  Intact to touch and monofilament testing bilaterally:  Yes  Pulse Check  Posterior Tibialis and Dorsalis pulse intact bilaterally:  Yes  Comments       Lab Results  Component Value Date   WBC 8.9 07/15/2013   HGB 15.1 07/15/2013   HCT 44 07/15/2013   PLT 230 07/15/2013   CHOL 175 07/15/2013   TRIG 217* 07/15/2013   HDL 36 07/15/2013   LDLCALC 96 07/15/2013   TSH 2.61 07/15/2013   HGBA1C 8.5 12/13/2014    CMP     Component Value Date/Time   NA 142 06/14/2014   K 5.5* 06/14/2014   BUN 20 06/14/2014   CREATININE 1.2 06/14/2014   AST 19 06/14/2014   ALT 21 06/14/2014   ALKPHOS 51 06/14/2014    Assessment and Plan :  1. Type 2 diabetes mellitus without complication, without long-term current use of insulin (HCC)  - POCT HgB A1C- 8.6 today. Stable. Continue to follow.  2. Hypercholesterolemia without hypertriglyceridemia  3. Alzheimer's Dementia- MMSE next OV. I have done the exam and reviewed the above chart and it is accurate to the best of my knowledge.  Patient was seen and examined by Dr. Miguel Aschoff, and noted scribed by Webb Laws, [place] MD [facility] 04/18/2015 8:24 AM

## 2015-04-19 ENCOUNTER — Other Ambulatory Visit: Payer: Self-pay

## 2015-04-19 MED ORDER — GLUCOSE BLOOD VI STRP: ORAL_STRIP | Status: DC

## 2015-04-21 ENCOUNTER — Other Ambulatory Visit: Payer: Self-pay | Admitting: Family Medicine

## 2015-05-12 ENCOUNTER — Ambulatory Visit (INDEPENDENT_AMBULATORY_CARE_PROVIDER_SITE_OTHER): Payer: Medicare Other | Admitting: Family Medicine

## 2015-05-12 ENCOUNTER — Encounter: Payer: Self-pay | Admitting: Family Medicine

## 2015-05-12 ENCOUNTER — Other Ambulatory Visit: Payer: Self-pay | Admitting: Family Medicine

## 2015-05-12 VITALS — BP 118/70 | HR 80 | Temp 98.6°F | Resp 17 | Wt 204.4 lb

## 2015-05-12 DIAGNOSIS — R300 Dysuria: Secondary | ICD-10-CM

## 2015-05-12 LAB — POCT URINALYSIS DIPSTICK
GLUCOSE UA: 500
KETONES UA: NEGATIVE
LEUKOCYTES UA: NEGATIVE
NITRITE UA: NEGATIVE
Protein, UA: 100
Spec Grav, UA: >=1.03
Urobilinogen, UA: 0.2
pH, UA: 5

## 2015-05-12 MED ORDER — NITROFURANTOIN MONOHYD MACRO 100 MG PO CAPS: 100.0000 mg | ORAL_CAPSULE | Freq: Two times a day (BID) | ORAL | Status: DC

## 2015-05-12 NOTE — Progress Notes (Signed)
Subjective:     Patient ID: Joel Grant, male   DOB: March 25, 1927, 82 y.o.   MRN: [id number]  HPI  Chief Complaint  Patient presents with  . Dysuria    Patient comes in office today accompanied by his wife with complaints of dysuria and frequency for the past 2 weeks.   Patient has dementia. Mrs. Tippin states he c/o of burning with urination for the first time today. Hx of BPH and D.M but no recent hx of UTI.   Review of Systems     Objective:   Physical Exam  Constitutional: He appears well-developed and well-nourished. No distress.  Genitourinary:  No c.v.a. tenderness       Assessment:    1. Dysuria - Urine culture - POCT urinalysis dipstick - nitrofurantoin, macrocrystal-monohydrate, (MACROBID) 100 MG capsule; Take 1 capsule (100 mg total) by mouth 2 (two) times daily.  Dispense: 20 capsule; Refill: 0    Plan:    Further f/u pending urine culture.

## 2015-05-12 NOTE — Patient Instructions (Signed)
We will call you with the urine culture results. 

## 2015-05-16 LAB — URINE CULTURE

## 2015-06-07 ENCOUNTER — Encounter: Payer: Self-pay | Admitting: *Deleted

## 2015-06-08 ENCOUNTER — Encounter: Payer: Self-pay | Admitting: Cardiology

## 2015-06-08 ENCOUNTER — Encounter: Payer: Self-pay | Admitting: *Deleted

## 2015-08-15 ENCOUNTER — Ambulatory Visit (INDEPENDENT_AMBULATORY_CARE_PROVIDER_SITE_OTHER): Payer: Medicare Other | Admitting: Family Medicine

## 2015-08-15 ENCOUNTER — Encounter: Payer: Self-pay | Admitting: Family Medicine

## 2015-08-15 VITALS — BP 122/58 | HR 74 | Temp 97.8°F | Resp 16 | Wt 204.0 lb

## 2015-08-15 DIAGNOSIS — E119 Type 2 diabetes mellitus without complications: Secondary | ICD-10-CM | POA: Diagnosis not present

## 2015-08-15 DIAGNOSIS — G309 Alzheimer's disease, unspecified: Secondary | ICD-10-CM

## 2015-08-15 DIAGNOSIS — E875 Hyperkalemia: Secondary | ICD-10-CM | POA: Diagnosis not present

## 2015-08-15 DIAGNOSIS — E7800 Pure hypercholesterolemia, unspecified: Secondary | ICD-10-CM

## 2015-08-15 DIAGNOSIS — E039 Hypothyroidism, unspecified: Secondary | ICD-10-CM

## 2015-08-15 DIAGNOSIS — F0280 Dementia in other diseases classified elsewhere without behavioral disturbance: Secondary | ICD-10-CM | POA: Diagnosis not present

## 2015-08-15 LAB — POCT GLYCOSYLATED HEMOGLOBIN (HGB A1C): Hemoglobin A1C: 9.2

## 2015-08-15 MED ORDER — METFORMIN HCL 1000 MG PO TABS: 1000.0000 mg | ORAL_TABLET | Freq: Every day | ORAL | Status: DC

## 2015-08-15 NOTE — Progress Notes (Signed)
Patient ID: Joel Grant, male   DOB: [birth year], 82 y.o.   MRN: [id number]    Subjective:  HPI  Diabetes Mellitus Type II, Follow-up:   Lab Results  Component Value Date   HGBA1C 8.6 04/18/2015   HGBA1C 8.5 12/13/2014   HGBA1C 7.2* 06/14/2014    Last seen for diabetes 4 months ago.  Management since then includes none. He reports good compliance with treatment. He is not having side effects.  Current symptoms include none and have been unchanged. Home blood sugar records: 160-190's  Episodes of hypoglycemia? no   Current Insulin Regimen: n/a Most Recent Eye Exam: pt wife will call to make an appointment Weight trend: stable Current exercise: none, when wife asks him to go walk, he says that his feet and legs hurt. Pt reports that it is due to old age.   Pertinent Labs:    Component Value Date/Time   CHOL 175 07/15/2013   TRIG 217* 07/15/2013   HDL 36 07/15/2013   LDLCALC 96 07/15/2013   CREATININE 1.2 06/14/2014    Wt Readings from Last 3 Encounters:  08/15/15 204 lb (92.534 kg)  05/12/15 204 lb 6.4 oz (92.715 kg)  04/18/15 206 lb (93.441 kg)    ------------------------------------------------------------------------   Alzheimer's Dementia- Wife reports that his memory is about the same, maybe slightly worse. Pt struggled with MMSE today.  Prior to Admission medications   Medication Sig Start Date End Date Taking? Authorizing Provider  [facility] ULTRA THIN LANCETS MISC USE 1 LANCET TO CHECK SUGAR TWICE A DAY 05/12/15  Yes Richard Maceo Pro., MD  glucose blood (FREESTYLE LITE) test strip Check sugar once daily. DX: E11.9 04/19/15  Yes Richard Maceo Pro., MD  metFORMIN (GLUCOPHAGE) 500 MG tablet Take 1 tablet (500 mg total) by mouth daily after supper. 12/20/14  Yes Richard Maceo Pro., MD    Patient Active Problem List   Diagnosis Date Noted  . Adenomatous colon polyp 12/09/2014  . Alzheimer's dementia 12/09/2014  . 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  Diabetes ([place]) 12/09/2014  . Glaucoma 12/09/2014  . Adult hypothyroidism 12/09/2014  . Adiposity 12/09/2014  . Arthritis, degenerative 12/09/2014  . Hypercholesterolemia without hypertriglyceridemia 12/09/2014  . Avitaminosis D 12/09/2014    History reviewed. No pertinent past medical history.  Social History   Social History  . Marital Status: Married    Spouse Name: N/A  . Number of Children: N/A  . Years of Education: N/A   Occupational History  . Not on file.   Social History Main Topics  . Smoking status: Former Research scientist (life sciences)  . Smokeless tobacco: Not on file  . Alcohol Use: No  . Drug Use: No  . Sexual Activity: Not on file   Other Topics Concern  . Not on file   Social History Narrative    Allergies  Allergen Reactions  . Ciprofloxacin Anaphylaxis  . Hydrocodone-Acetaminophen Nausea And Vomiting  . Penicillins   . Sulfa Antibiotics     Review of Systems  Constitutional: Negative.   HENT: Negative.   Eyes: Negative.   Respiratory: Negative.   Cardiovascular: Negative.   Gastrointestinal: Negative.   Genitourinary: Negative.   Musculoskeletal: Negative.        Leg  Skin: Negative.   Neurological: Negative.   Endo/Heme/Allergies: Negative.   Psychiatric/Behavioral: Positive for memory loss.    Immunization History  Administered Date(s) Administered  . Influenza, High Dose Seasonal PF 12/13/2014  . Pneumococcal Conjugate-13 03/18/2014  . Pneumococcal Polysaccharide-23  01/14/2013  . Td 11/02/2002   Objective:  BP 122/58 mmHg  Pulse 74  Temp(Src) 97.8 F (36.6 C) (Oral)  Resp 16  Wt 204 lb (92.534 kg)  Physical Exam  Constitutional: He is oriented to person, place, and time and well-developed, well-nourished, and in no distress.  HENT:  Head: Normocephalic and atraumatic.  Right Ear: External ear normal.  Left Ear: External ear normal.  Nose: Nose normal.  Eyes: Conjunctivae and EOM are normal. Pupils are equal, round, and reactive to light.    Neck: Normal range of motion. Neck supple.  Cardiovascular: Normal rate, regular rhythm, normal heart sounds and intact distal pulses.   Pulmonary/Chest: Effort normal and breath sounds normal.  Abdominal: Soft.  Musculoskeletal: Normal range of motion.  Neurological: He is alert and oriented to person, place, and time. He has normal reflexes. Gait normal. GCS score is 15.  Skin: Skin is warm and dry.  Psychiatric: Mood normal.    Lab Results  Component Value Date   WBC 8.9 07/15/2013   HGB 15.1 07/15/2013   HCT 44 07/15/2013   PLT 230 07/15/2013   CHOL 175 07/15/2013   TRIG 217* 07/15/2013   HDL 36 07/15/2013   LDLCALC 96 07/15/2013   TSH 2.61 07/15/2013   HGBA1C 8.6 04/18/2015    CMP     Component Value Date/Time   NA 142 06/14/2014   K 5.5* 06/14/2014   BUN 20 06/14/2014   CREATININE 1.2 06/14/2014   AST 19 06/14/2014   ALT 21 06/14/2014   ALKPHOS 51 06/14/2014    Assessment and Plan :  1. Type 2 diabetes mellitus without complication, without long-term current use of insulin (HCC)  - POCT HgB A1C 9.2 today. Worse. Increase Metformin to 1000 mg daily. - metFORMIN (GLUCOPHAGE) 1000 MG tablet; Take 1 tablet (1,000 mg total) by mouth daily after supper.  Dispense: 90 tablet; Refill: 3  2. Alzheimer's dementia Worsening, MMSE today 17/28. Told not to drive.    Patient was seen and examined by Dr. Miguel Aschoff, and noted scribed by Webb Laws, [place] MD [facility] 08/15/2015 8:54 AM

## 2015-08-16 DIAGNOSIS — E875 Hyperkalemia: Secondary | ICD-10-CM | POA: Diagnosis not present

## 2015-08-16 LAB — CBC WITH DIFFERENTIAL/PLATELET
BASOS: 1 %
Basophils Absolute: 0.1 10*3/uL (ref 0.0–0.2)
EOS (ABSOLUTE): 0.2 10*3/uL (ref 0.0–0.4)
EOS: 3 %
HEMATOCRIT: 39.8 % (ref 37.5–51.0)
Hemoglobin: 13.5 g/dL (ref 12.6–17.7)
Immature Grans (Abs): 0 10*3/uL (ref 0.0–0.1)
Immature Granulocytes: 0 %
LYMPHS ABS: 2.7 10*3/uL (ref 0.7–3.1)
Lymphs: 33 %
MCH: 30.4 pg (ref 26.6–33.0)
MCHC: 33.9 g/dL (ref 31.5–35.7)
MCV: 90 fL (ref 79–97)
MONOS ABS: 0.7 10*3/uL (ref 0.1–0.9)
Monocytes: 9 %
Neutrophils Absolute: 4.4 10*3/uL (ref 1.4–7.0)
Neutrophils: 54 %
Platelets: 229 10*3/uL (ref 150–379)
RBC: 4.44 x10E6/uL (ref 4.14–5.80)
RDW: 14.6 % (ref 12.3–15.4)
WBC: 8.1 10*3/uL (ref 3.4–10.8)

## 2015-08-16 LAB — COMPREHENSIVE METABOLIC PANEL
ALBUMIN: 4.1 g/dL (ref 3.5–4.7)
ALK PHOS: 56 IU/L (ref 39–117)
ALT: 32 IU/L (ref 0–44)
AST: 25 IU/L (ref 0–40)
Albumin/Globulin Ratio: 1.6 (ref 1.2–2.2)
BILIRUBIN TOTAL: 0.4 mg/dL (ref 0.0–1.2)
BUN / CREAT RATIO: 14 (ref 10–24)
BUN: 17 mg/dL (ref 8–27)
CHLORIDE: 97 mmol/L (ref 96–106)
CO2: 24 mmol/L (ref 18–29)
CREATININE: 1.24 mg/dL (ref 0.76–1.27)
Calcium: 9.8 mg/dL (ref 8.6–10.2)
GFR calc Af Amer: 60 mL/min/{1.73_m2} (ref >59)
GFR calc non Af Amer: 52 mL/min/{1.73_m2} — ABNORMAL LOW (ref >59)
GLOBULIN, TOTAL: 2.5 g/dL (ref 1.5–4.5)
Glucose: 197 mg/dL — ABNORMAL HIGH (ref 65–99)
Potassium: 5.9 mmol/L — ABNORMAL HIGH (ref 3.5–5.2)
Sodium: 139 mmol/L (ref 134–144)
Total Protein: 6.6 g/dL (ref 6.0–8.5)

## 2015-08-16 LAB — LIPID PANEL WITH LDL/HDL RATIO
CHOLESTEROL TOTAL: 165 mg/dL (ref 100–199)
HDL: 34 mg/dL — AB (ref >39)
LDL CALC: 74 mg/dL (ref 0–99)
LDl/HDL Ratio: 2.2 ratio units (ref 0.0–3.6)
Triglycerides: 283 mg/dL — ABNORMAL HIGH (ref 0–149)
VLDL CHOLESTEROL CAL: 57 mg/dL — AB (ref 5–40)

## 2015-08-16 LAB — TSH: TSH: 2.77 u[IU]/mL (ref 0.450–4.500)

## 2015-08-17 ENCOUNTER — Ambulatory Visit: Payer: Medicare Other | Admitting: Family Medicine

## 2015-08-17 LAB — POTASSIUM: POTASSIUM: 4.7 mmol/L (ref 3.5–5.2)

## 2015-09-22 DIAGNOSIS — H2512 Age-related nuclear cataract, left eye: Secondary | ICD-10-CM | POA: Diagnosis not present

## 2015-09-23 ENCOUNTER — Encounter: Payer: Self-pay | Admitting: Family Medicine

## 2015-12-16 ENCOUNTER — Ambulatory Visit (INDEPENDENT_AMBULATORY_CARE_PROVIDER_SITE_OTHER): Payer: Medicare Other

## 2015-12-16 DIAGNOSIS — Z23 Encounter for immunization: Secondary | ICD-10-CM

## 2016-01-28 ENCOUNTER — Emergency Department: Payer: Medicare Other

## 2016-01-28 ENCOUNTER — Encounter: Payer: Self-pay | Admitting: Emergency Medicine

## 2016-01-28 ENCOUNTER — Inpatient Hospital Stay
Admission: EM | Admit: 2016-01-28 | Discharge: 2016-01-30 | DRG: 871 | Disposition: A | Discharge status: Home / Self Care | Payer: Medicare Other | Attending: Internal Medicine | Admitting: Internal Medicine

## 2016-01-28 ENCOUNTER — Inpatient Hospital Stay: Payer: Medicare Other

## 2016-01-28 DIAGNOSIS — R413 Other amnesia: Secondary | ICD-10-CM | POA: Diagnosis not present

## 2016-01-28 DIAGNOSIS — Z87891 Personal history of nicotine dependence: Secondary | ICD-10-CM

## 2016-01-28 DIAGNOSIS — Z7984 Long term (current) use of oral hypoglycemic drugs: Secondary | ICD-10-CM | POA: Diagnosis not present

## 2016-01-28 DIAGNOSIS — N183 Chronic kidney disease, stage 3 (moderate): Secondary | ICD-10-CM | POA: Diagnosis present

## 2016-01-28 DIAGNOSIS — R222 Localized swelling, mass and lump, trunk: Secondary | ICD-10-CM | POA: Diagnosis present

## 2016-01-28 DIAGNOSIS — N179 Acute kidney failure, unspecified: Secondary | ICD-10-CM | POA: Diagnosis present

## 2016-01-28 DIAGNOSIS — A419 Sepsis, unspecified organism: Principal | ICD-10-CM | POA: Diagnosis present

## 2016-01-28 DIAGNOSIS — R6520 Severe sepsis without septic shock: Secondary | ICD-10-CM | POA: Diagnosis present

## 2016-01-28 DIAGNOSIS — F0390 Unspecified dementia without behavioral disturbance: Secondary | ICD-10-CM | POA: Diagnosis present

## 2016-01-28 DIAGNOSIS — J181 Lobar pneumonia, unspecified organism: Secondary | ICD-10-CM | POA: Diagnosis not present

## 2016-01-28 DIAGNOSIS — Z833 Family history of diabetes mellitus: Secondary | ICD-10-CM | POA: Diagnosis not present

## 2016-01-28 DIAGNOSIS — J189 Pneumonia, unspecified organism: Secondary | ICD-10-CM | POA: Diagnosis not present

## 2016-01-28 DIAGNOSIS — R05 Cough: Secondary | ICD-10-CM | POA: Diagnosis not present

## 2016-01-28 DIAGNOSIS — E1122 Type 2 diabetes mellitus with diabetic chronic kidney disease: Secondary | ICD-10-CM | POA: Diagnosis present

## 2016-01-28 DIAGNOSIS — L409 Psoriasis, unspecified: Secondary | ICD-10-CM | POA: Diagnosis present

## 2016-01-28 DIAGNOSIS — E785 Hyperlipidemia, unspecified: Secondary | ICD-10-CM | POA: Diagnosis present

## 2016-01-28 DIAGNOSIS — Z8249 Family history of ischemic heart disease and other diseases of the circulatory system: Secondary | ICD-10-CM

## 2016-01-28 DIAGNOSIS — R918 Other nonspecific abnormal finding of lung field: Secondary | ICD-10-CM | POA: Diagnosis not present

## 2016-01-28 DIAGNOSIS — E872 Acidosis: Secondary | ICD-10-CM | POA: Diagnosis present

## 2016-01-28 DIAGNOSIS — E119 Type 2 diabetes mellitus without complications: Secondary | ICD-10-CM | POA: Diagnosis not present

## 2016-01-28 DIAGNOSIS — E1110 Type 2 diabetes mellitus with ketoacidosis without coma: Secondary | ICD-10-CM

## 2016-01-28 HISTORY — DX: Psoriasis, unspecified: L40.9

## 2016-01-28 HISTORY — DX: Respiratory tuberculosis unspecified: A15.9

## 2016-01-28 HISTORY — DX: Type 2 diabetes mellitus without complications: E11.9

## 2016-01-28 HISTORY — DX: Unspecified dementia, unspecified severity, without behavioral disturbance, psychotic disturbance, mood disturbance, and anxiety: F03.90

## 2016-01-28 LAB — CBC WITH DIFFERENTIAL/PLATELET
Basophils Absolute: 0.1 10*3/uL (ref 0–0.1)
Basophils Relative: 0 %
EOS PCT: 1 %
Eosinophils Absolute: 0.1 10*3/uL (ref 0–0.7)
HCT: 39.9 % — ABNORMAL LOW (ref 40.0–52.0)
Hemoglobin: 13.7 g/dL (ref 13.0–18.0)
LYMPHS ABS: 1.5 10*3/uL (ref 1.0–3.6)
LYMPHS PCT: 7 %
MCH: 30.2 pg (ref 26.0–34.0)
MCHC: 34.2 g/dL (ref 32.0–36.0)
MCV: 88.2 fL (ref 80.0–100.0)
MONO ABS: 1.1 10*3/uL — AB (ref 0.2–1.0)
MONOS PCT: 5 %
Neutro Abs: 19 10*3/uL — ABNORMAL HIGH (ref 1.4–6.5)
Neutrophils Relative %: 87 %
PLATELETS: 279 10*3/uL (ref 150–440)
RBC: 4.53 MIL/uL (ref 4.40–5.90)
RDW: 13.6 % (ref 11.5–14.5)
WBC: 21.7 10*3/uL — ABNORMAL HIGH (ref 3.8–10.6)

## 2016-01-28 LAB — COMPREHENSIVE METABOLIC PANEL
ALBUMIN: 3.6 g/dL (ref 3.5–5.0)
ALT: 32 U/L (ref 17–63)
AST: 31 U/L (ref 15–41)
Alkaline Phosphatase: 61 U/L (ref 38–126)
Anion gap: 11 (ref 5–15)
BILIRUBIN TOTAL: 0.6 mg/dL (ref 0.3–1.2)
BUN: 23 mg/dL — AB (ref 6–20)
CHLORIDE: 100 mmol/L — AB (ref 101–111)
CO2: 23 mmol/L (ref 22–32)
Calcium: 8.8 mg/dL — ABNORMAL LOW (ref 8.9–10.3)
Creatinine, Ser: 1.43 mg/dL — ABNORMAL HIGH (ref 0.61–1.24)
GFR calc Af Amer: 50 mL/min — ABNORMAL LOW (ref >60)
GFR calc non Af Amer: 43 mL/min — ABNORMAL LOW (ref >60)
GLUCOSE: 278 mg/dL — AB (ref 65–99)
POTASSIUM: 4.6 mmol/L (ref 3.5–5.1)
Sodium: 134 mmol/L — ABNORMAL LOW (ref 135–145)
Total Protein: 7.1 g/dL (ref 6.5–8.1)

## 2016-01-28 LAB — URINALYSIS COMPLETE WITH MICROSCOPIC (ARMC ONLY)
Bacteria, UA: NONE SEEN
Bilirubin Urine: NEGATIVE
Glucose, UA: >500 mg/dL — AB
Leukocytes, UA: NEGATIVE
Nitrite: NEGATIVE
PH: 5 (ref 5.0–8.0)
PROTEIN: 100 mg/dL — AB
SQUAMOUS EPITHELIAL / LPF: NONE SEEN
Specific Gravity, Urine: 1.021 (ref 1.005–1.030)

## 2016-01-28 LAB — TROPONIN I: Troponin I: <0.03 ng/mL (ref <0.03)

## 2016-01-28 LAB — LACTIC ACID, PLASMA
Lactic Acid, Venous: 2.2 mmol/L (ref 0.5–1.9)
Lactic Acid, Venous: 1.9 mmol/L (ref 0.5–1.9)

## 2016-01-28 LAB — GLUCOSE, CAPILLARY: GLUCOSE-CAPILLARY: 236 mg/dL — AB (ref 65–99)

## 2016-01-28 MED ORDER — SODIUM CHLORIDE 0.9 % IV BOLUS (SEPSIS)
1000.0000 mL | Freq: Once | INTRAVENOUS | Status: AC
  Administered 2016-01-28: 1000 mL via INTRAVENOUS

## 2016-01-28 MED ORDER — AZITHROMYCIN 250 MG PO TABS
250.0000 mg | ORAL_TABLET | Freq: Every day | ORAL | Status: DC
  Administered 2016-01-29 – 2016-01-30 (×2): 250 mg via ORAL
  Filled 2016-01-28 (×2): qty 1

## 2016-01-28 MED ORDER — INSULIN ASPART 100 UNIT/ML ~~LOC~~ SOLN
0.0000 [IU] | Freq: Three times a day (TID) | SUBCUTANEOUS | Status: DC
  Administered 2016-01-29: 3 [IU] via SUBCUTANEOUS
  Administered 2016-01-29: 5 [IU] via SUBCUTANEOUS
  Administered 2016-01-29: 3 [IU] via SUBCUTANEOUS
  Administered 2016-01-30: 5 [IU] via SUBCUTANEOUS
  Administered 2016-01-30: 3 [IU] via SUBCUTANEOUS
  Filled 2016-01-28: qty 5
  Filled 2016-01-28 (×2): qty 3
  Filled 2016-01-28: qty 5
  Filled 2016-01-28: qty 3

## 2016-01-28 MED ORDER — ACETAMINOPHEN 325 MG PO TABS
650.0000 mg | ORAL_TABLET | Freq: Four times a day (QID) | ORAL | Status: DC | PRN
Start: 2016-01-28 — End: 2016-01-30
  Filled 2016-01-28: qty 2

## 2016-01-28 MED ORDER — INSULIN ASPART 100 UNIT/ML ~~LOC~~ SOLN
0.0000 [IU] | Freq: Every day | SUBCUTANEOUS | Status: DC
  Administered 2016-01-28 – 2016-01-29 (×2): 2 [IU] via SUBCUTANEOUS
  Filled 2016-01-28 (×2): qty 2

## 2016-01-28 MED ORDER — SODIUM CHLORIDE 0.9 % IV SOLN: INTRAVENOUS | Status: DC

## 2016-01-28 MED ORDER — DEXTROSE 5 % IV SOLN
500.0000 mg | Freq: Once | INTRAVENOUS | Status: AC
  Administered 2016-01-28: 500 mg via INTRAVENOUS
  Filled 2016-01-28: qty 500

## 2016-01-28 MED ORDER — DEXTROSE 5 % IV SOLN: 1.0000 g | Freq: Once | INTRAVENOUS | Status: DC

## 2016-01-28 MED ORDER — ENOXAPARIN SODIUM 40 MG/0.4ML ~~LOC~~ SOLN
40.0000 mg | SUBCUTANEOUS | Status: DC
  Administered 2016-01-29: 40 mg via SUBCUTANEOUS
  Filled 2016-01-28: qty 0.4

## 2016-01-28 MED ORDER — ACETAMINOPHEN 650 MG RE SUPP: 650.0000 mg | Freq: Four times a day (QID) | RECTAL | Status: DC | PRN

## 2016-01-28 MED ORDER — CEFTRIAXONE SODIUM-DEXTROSE 1-3.74 GM-% IV SOLR
1.0000 g | INTRAVENOUS | Status: DC
  Administered 2016-01-28 – 2016-01-29 (×2): 1 g via INTRAVENOUS
  Filled 2016-01-28 (×4): qty 50

## 2016-01-28 MED ORDER — HALOPERIDOL LACTATE 5 MG/ML IJ SOLN
5.0000 mg | Freq: Once | INTRAMUSCULAR | Status: AC
  Administered 2016-01-28: 5 mg via INTRAMUSCULAR
  Filled 2016-01-28: qty 1

## 2016-01-28 NOTE — ED Triage Notes (Signed)
Pt arrived from home with wife with reports of cold for the past 2 weeks. Pt has been taking Allegra at home with no improvements. Today patient had vomited x2 at home. [facility] and had a temp of 101.2 tympanic.  Pt also c/o nasal drainage.  Per wife, when patient vomited the second time she said she thought he was strangled up by his phelgm and was having difficulty breathing.

## 2016-01-28 NOTE — ED Provider Notes (Signed)
[facility] Emergency Department Provider Note  Time seen: 4:56 PM  I have reviewed the triage vital signs and the nursing notes.   HISTORY  Chief Complaint Cough and Fever    HPI Joel Grant is a 82 y.o. male with a past medical history of dementia, diabetes, hyperlipidemia, who presents the emergency department with cough, congestion and fever. According to the patient and the patient's wife he has been coughing for the past 2 weeks. States the cough is gotten worse over the past several days. Had a fever to 101 earlier today so she brought him to the urgent care who sent him to the emergency department for evaluation. Here the patient states continued cough, denies any chest pain. Patient satting 89-90 percent on room air, no home oxygen requirement at baseline. Heart rate around 120 bpm with a temperature of 99.1 however the patient took Aleve prior to coming to the emergency department. Patient denies any chest pain.  Past Medical History:  Diagnosis Date  . Diabetes mellitus without complication  [facility])     Patient Active Problem List   Diagnosis Date Noted  . Adenomatous colon polyp 12/09/2014  . Alzheimer's dementia 12/09/2014  . 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 Diabetes ([place]) 12/09/2014  . Glaucoma 12/09/2014  . Adult hypothyroidism 12/09/2014  . Adiposity 12/09/2014  . Arthritis, degenerative 12/09/2014  . Hypercholesterolemia without hypertriglyceridemia 12/09/2014  . Avitaminosis D 12/09/2014    Past Surgical History:  Procedure Laterality Date  . CATARACT EXTRACTION      Prior to Admission medications   Medication Sig Start Date End Date Taking? Authorizing Provider  [employer] ULTRA THIN LANCETS MISC USE 1 LANCET TO CHECK SUGAR TWICE A DAY 05/12/15   Richard Maceo Pro., MD  glucose blood (FREESTYLE LITE) test strip Check sugar once daily. DX: E11.9 04/19/15   Richard Maceo Pro., MD  metFORMIN (GLUCOPHAGE) 1000 MG tablet Take 1  tablet (1,000 mg total) by mouth daily after supper. 08/15/15   Richard Maceo Pro., MD    Allergies  Allergen Reactions  . Ciprofloxacin Anaphylaxis  . Hydrocodone-Acetaminophen Nausea And Vomiting  . Penicillins   . Sulfa Antibiotics     Family History  Problem Relation Age of Onset  . Heart disease Mother   . Diabetes Father   . Heart disease Father   . Peripheral vascular disease Brother   . Diabetes Brother     Social History Social History  Substance Use Topics  . Smoking status: Former Research scientist (life sciences)  . Smokeless tobacco: Never Used  . Alcohol use No    Review of Systems Constitutional: Positive for fever Cardiovascular: Negative for chest pain. Respiratory: Positive for shortness of breath. Positive for cough. Gastrointestinal: Negative for abdominal pain, vomiting and diarrhea Neurological: Negative for headache 10-point ROS otherwise negative.  ____________________________________________   PHYSICAL EXAM:  VITAL SIGNS: ED Triage Vitals [01/28/16 1634]  Enc Vitals Group     BP 137/64     Pulse Rate (!) 120     Resp 18     Temp 99.1 F (37.3 C)     Temp Source Oral     SpO2 94 %     Weight 204 lb (92.5 kg)     Height '5\' 8"'$  (1.727 m)     Head Circumference      Peak Flow      Pain Score 0     Pain Loc      Pain Edu?  Excl. in [place]?     Constitutional: Alert and oriented. Well appearing and in no distress. Eyes: Normal exam ENT   Head: Normocephalic and atraumatic.   Mouth/Throat: Mucous membranes are moist. Cardiovascular: Regular rhythm around 120 bpm. Respiratory: Mild tachypnea. Left-sided rhonchi. No wheeze. Gastrointestinal: Soft and nontender. No distention.   Musculoskeletal: Nontender with normal range of motion in all extremities. No lower extremity tenderness or edema. Neurologic:  Normal speech and language. No gross focal neurologic deficits Skin:  Skin is warm, dry and intact.  Psychiatric: Mood and affect are normal. Speech  and behavior are normal.   ____________________________________________    EKG  EKG reviewed and interpreted by myself shows sinus tachycardia 115 bpm, slightly widened QRS, normal axis, largely normal intervals with nonspecific ST changes. No ST elevation.  ____________________________________________    RADIOLOGY  X-rays shows mass versus rounded pneumonia  ____________________________________________   INITIAL IMPRESSION / ASSESSMENT AND PLAN / ED COURSE  Pertinent labs & imaging results that were available during my care of the patient were reviewed by me and considered in my medical decision making (see chart for details).  The patient presents the emergency department with cough and fever to 101. Patient tachycardic with a room air saturation of 90%, meeting sepsis criteria. I ordered sepsis protocols. We will start empiric antibiotics with ceftriaxone and azithromycin. Patient does state an anaphylactic ciprofloxacin allergy. States itching to penicillins many years ago. I believe Rocephin should be a safe choice for the patient. We will obtain labs, chest x-ray, EKG and continue to closely monitor. I anticipate admission to the hospital given the patient's age, comorbidities and presentation.  Chest x-ray consistent with mass versus pneumonia. Given elevated white blood cell count, fever and hypoxia highly suspect pneumonia or least an underlying degree of pneumonia. Patient will be admitted to the hospitalist service for further treatment. Patient and family are agreeable to this plan.  CRITICAL CARE Performed by: Harvest Dark   Total critical care time: 30 minutes  Critical care time was exclusive of separately billable procedures and treating other patients.  Critical care was necessary to treat or prevent imminent or life-threatening deterioration.  Critical care was time spent personally by me on the following activities: development of treatment plan with  patient and/or surrogate as well as nursing, discussions with consultants, evaluation of patient's response to treatment, examination of patient, obtaining history from patient or surrogate, ordering and performing treatments and interventions, ordering and review of laboratory studies, ordering and review of radiographic studies, pulse oximetry and re-evaluation of patient's condition.   ____________________________________________   FINAL CLINICAL IMPRESSION(S) / ED DIAGNOSES  Pneumonia Sepsis    Harvest Dark, MD 01/28/16 1810

## 2016-01-28 NOTE — Consult Note (Signed)
ANTIBIOTIC CONSULT NOTE - INITIAL  Pharmacy Consult for Ceftriaxone and Azithromycin Indication: pneumonia  Allergies  Allergen Reactions  . Ciprofloxacin Anaphylaxis  . Hydrocodone-Acetaminophen Nausea And Vomiting  . Penicillins   . Sulfa Antibiotics     Patient Measurements: Height: '5\' 8"'$  (172.7 cm) Weight: 204 lb (92.5 kg) IBW/kg (Calculated) : 68.4  Vital Signs: Temp: 99.1 F (37.3 C) (10/28 1634) Temp Source: Oral (10/28 1634) BP: 137/64 (10/28 1634) Pulse Rate: 120 (10/28 1634) Intake/Output from previous day: No intake/output data recorded. Intake/Output from this shift: No intake/output data recorded.  Microbiology: No results found for this or any previous visit (from the past 720 hour(s)).  Medical History: Past Medical History:  Diagnosis Date  . Diabetes mellitus without complication (HCC)     Medications:  Scheduled:  . cefTRIAXone  1 g Intravenous Q24H   Assessment: Joel Grant is an 82 yo male admitted for CAP. Pharmacy consulted to dose ceftriaxone and azithromycin for the same. Patient received azithromycin '500mg'$  IV x1 in ED with ceftriaxone 1g IV.  Plan:  Will initiate ceftriaxone 1g IV q24hrs and azithromycin '250mg'$  PO q24hrs.  Expected duration 5-7 days.  Will follow up on culture results and signs of infection resolution.   Pharmacy will sign off, please re-consult if necessary.  Vena Rua 01/28/2016,4:57 PM

## 2016-01-28 NOTE — H&P (Signed)
[place] [facility] [place] NAME: Joel Grant    MR#:  [id number]  DATE OF BIRTH:  May 13, 1926  DATE OF ADMISSION:  01/28/2016  PRIMARY CARE PHYSICIAN: Wilhemena Durie, MD   REQUESTING/REFERRING PHYSICIAN: Dr Harvest Dark  CHIEF COMPLAINT:   Chief Complaint  Patient presents with  . Cough  . Fever    HISTORY OF PRESENT ILLNESS:  Joel Grant  is a 82 y.o. male presents with having a cold for 2 weeks. He's been taking Aleve as outpatient. He's been coughing up some yellow phlegm. Is also been taking Robitussin-DM as outpatient. Today has gotten worse. Had a temperature of 101 and low oxygen level. Family gave Aleve after the fever today. He is also been having coughing spells and vomiting because of the phlegm. In the ER, he was found to have a masslike opacity in the right upper lung, fever, tachycardia, lactic acid elevation in white blood cell count elevation. Hospitalist services were contacted for further evaluation  PAST MEDICAL HISTORY:   Past Medical History:  Diagnosis Date  . Dementia   . Diabetes mellitus without complication ([place])   . Psoriasis     PAST SURGICAL HISTORY:   Past Surgical History:  Procedure Laterality Date  . CATARACT EXTRACTION      SOCIAL HISTORY:   Social History  Substance Use Topics  . Smoking status: Former Research scientist (life sciences)  . Smokeless tobacco: Never Used  . Alcohol use No    FAMILY HISTORY:   Family History  Problem Relation Age of Onset  . Heart disease Mother   . Dementia Mother   . Diabetes Father   . Heart disease Father   . Peripheral vascular disease Brother   . Diabetes Brother     DRUG ALLERGIES:   Allergies  Allergen Reactions  . Ciprofloxacin Anaphylaxis  . Hydrocodone-Acetaminophen Nausea And Vomiting  . Penicillins     rash  . Sulfa Antibiotics     REVIEW OF SYSTEMS:  CONSTITUTIONAL: Positive for fever and chills. Positive for fatigue.  EYES: No blurred or  double vision.  EARS, NOSE, AND THROAT: No tinnitus or ear pain. No sore throat. Decreased hearing. Positive for runny nose RESPIRATORY: Positive for cough with yellow phlegm. No shortness of breath, wheezing or hemoptysis.  CARDIOVASCULAR: No chest pain, orthopnea, edema.  GASTROINTESTINAL: Positive for nausea and vomiting. No diarrhea or abdominal pain. No blood in bowel movements GENITOURINARY: No dysuria, hematuria.  ENDOCRINE: No polyuria, nocturia,  HEMATOLOGY: No anemia, easy bruising or bleeding SKIN: Psoriasis left leg MUSCULOSKELETAL: No joint pain or arthritis.   NEUROLOGIC: No tingling, numbness, weakness.  PSYCHIATRY: No anxiety or depression.   MEDICATIONS AT HOME:   Prior to Admission medications   Medication Sig Start Date End Date Taking? Authorizing Provider  metFORMIN (GLUCOPHAGE) 1000 MG tablet Take 1 tablet (1,000 mg total) by mouth daily after supper. 08/15/15  Yes Evalise Abruzzese Maceo Pro., MD  [employer] ULTRA THIN LANCETS MISC USE 1 LANCET TO CHECK SUGAR TWICE A DAY 05/12/15   Jerrol Banana., MD  glucose blood (FREESTYLE LITE) test strip Check sugar once daily. DX: E11.9 04/19/15   Saleh Ulbrich Maceo Pro., MD    Medication reconciliation process still undergoing.  VITAL SIGNS:  Blood pressure 134/74, pulse (!) 105, temperature 99.1 F (37.3 C), temperature source Oral, resp. rate 20, height '5\' 8"'$  (1.727 m), weight 92.5 kg (204 lb), SpO2 98 %.  PHYSICAL EXAMINATION:  GENERAL:  82 y.o.-year-old patient  lying in the bed with no acute distress. Patient falling asleep easily. EYES: Pupils equal, round, reactive to light and accommodation. No scleral icterus. Extraocular muscles intact.  HEENT: Head atraumatic, normocephalic. Oropharynx and nasopharynx clear.  NECK:  Supple, no jugular venous distention. No thyroid enlargement, no tenderness.  LUNGS: Normal breath sounds bilaterally, no wheezing, rales,rhonchi or crepitation. No use of accessory muscles of respiration.   CARDIOVASCULAR: S1, S2 tachycardic. No murmurs, rubs, or gallops.  ABDOMEN: Soft, nontender, nondistended. Bowel sounds present. No organomegaly or mass.  EXTREMITIES: Trace pedal edema. No cyanosis, or clubbing.  NEUROLOGIC: Cranial nerves II through XII are intact. Muscle strength 5/5 in all extremities. Sensation intact. Gait not checked.  PSYCHIATRIC: The patient is alert and oriented x 3.  SKIN: Patchy scaling psoriasis left shin.  LABORATORY PANEL:   CBC  Recent Labs Lab 01/28/16 1655  WBC 21.7*  HGB 13.7  HCT 39.9*  PLT 279   ------------------------------------------------------------------------------------------------------------------  Chemistries   Recent Labs Lab 01/28/16 1655  NA 134*  K 4.6  CL 100*  CO2 23  GLUCOSE 278*  BUN 23*  CREATININE 1.43*  CALCIUM 8.8*  AST 31  ALT 32  ALKPHOS 61  BILITOT 0.6   ------------------------------------------------------------------------------------------------------------------  Cardiac Enzymes  Recent Labs Lab 01/28/16 1655  TROPONINI <0.03   ------------------------------------------------------------------------------------------------------------------  RADIOLOGY:  Dg Chest Port 1 View  Result Date: 01/28/2016 CLINICAL DATA:  Fever, cold for past 2 weeks, nasal drainage, vomiting. EXAM: PORTABLE CHEST 1 VIEW COMPARISON:  None. FINDINGS: There is a masslike opacity in the right upper lung measuring 3.4 x 3.2 cm. Lungs otherwise clear. No pleural effusion or pneumothorax seen. Mild cardiomegaly. Atherosclerotic changes noted at the aortic arch and there is expected age related aortic ectasia. Osseous structures about the chest are unremarkable. IMPRESSION: 1. Masslike opacity in the right upper lung measuring 3.4 x 3.2 cm. This could be a rounded pneumonia but is more likely a neoplastic mass. Recommend chest CT for further characterization. 2. Mild cardiomegaly. 3. Aortic atherosclerosis. Electronically  Signed   By: Franki Cabot M.D.   On: 01/28/2016 17:35    EKG:   Sinus tachycardia, right bundle branch block  IMPRESSION AND PLAN:   1. Clinical sepsis with pneumonia, fever, tachycardia and leukocytosis. With multiple drug allergies we did give Rocephin and Zithromax and I will continue that during the hospital course. Blood cultures in the ER. I ordered a sputum culture. Patient also has lactic acidosis secondary to sepsis. 2. Lactic acidosis. Hold Glucophage because this can also cause lactic acidosis. 3. Masslike opacity right upper lung. CT scan of the chest for further evaluation. Treat for pneumonia currently. 4. Type 2 diabetes mellitus. Sliding scale insulin for now. 5. History of memory loss. This may get worse here in the hospital 6. Psoriasis left leg. 7. Acute kidney injury on chronic kidney disease stage III. Gentle IV fluids.  All the records are reviewed and case discussed with ED provider. Management plans discussed with the patient, family and they are in agreement.  CODE STATUS: Full code  TOTAL TIME TAKING CARE OF THIS PATIENT: 50 minutes.    Loletha Grayer M.D on 01/28/2016 at 6:33 PM  Between 7am to 6pm - Pager - [phone]  After 6pm call admission pager [phone]  [facility]  [phone]  CC: Primary care physician; Wilhemena Durie, MD

## 2016-01-28 NOTE — Progress Notes (Signed)
Pt. Confused and combative. Dr.Huglemeyer ordered haldol '5mg'$  IM x1.

## 2016-01-29 LAB — GLUCOSE, CAPILLARY
GLUCOSE-CAPILLARY: 206 mg/dL — AB (ref 65–99)
GLUCOSE-CAPILLARY: 253 mg/dL — AB (ref 65–99)
Glucose-Capillary: 242 mg/dL — ABNORMAL HIGH (ref 65–99)
Glucose-Capillary: 225 mg/dL — ABNORMAL HIGH (ref 65–99)

## 2016-01-29 MED ORDER — HALOPERIDOL LACTATE 5 MG/ML IJ SOLN
2.0000 mg | Freq: Three times a day (TID) | INTRAMUSCULAR | Status: DC | PRN
  Administered 2016-01-29: 2 mg via INTRAVENOUS
  Filled 2016-01-29: qty 1

## 2016-01-29 NOTE — Progress Notes (Signed)
Patient is impulsive and has been getting up without calling. Asked family to call me rather than allow him to get up without nurse supervision. Family is supportive but frustrated with the patient b/c they say he won't listen to them. Tried to reinforce to patient not to jump up without help.

## 2016-01-29 NOTE — Care Management Important Message (Signed)
Important Message  Patient Details  Name: Joel Grant MRN: [id number] Date of Birth: [birth year]   Medicare Important Message Given:  Yes    Gary Bultman A, RN 01/29/2016, 3:06 PM

## 2016-01-29 NOTE — Progress Notes (Signed)
Paged Dr. Posey Pronto b/c patient is restless and becoming agitated. There are no PRN meds ordered to help patient.

## 2016-01-29 NOTE — Progress Notes (Signed)
Per Dr. Posey Pronto, ordered '2mg'$  Haldol IV Q8PRN as needed for agitation.

## 2016-01-29 NOTE — Progress Notes (Addendum)
Joel Grant at [place] NAME: Joel Grant    MR#:  [id number]  DATE OF BIRTH:  [birth year]  SUBJECTIVE:  Came in with fever, cough and congestion Combative last nite. Family in the room  REVIEW OF SYSTEMS:   Review of Systems  Unable to perform ROS: Dementia   DRUG ALLERGIES:   Allergies  Allergen Reactions  . Ciprofloxacin Anaphylaxis  . Hydrocodone-Acetaminophen Nausea And Vomiting  . Penicillins     rash  . Sulfa Antibiotics     VITALS:  Blood pressure (!) 144/60, pulse 87, temperature 98.2 F (36.8 C), temperature source Oral, resp. rate 19, height '5\' 8"'$  (1.727 m), weight 92.3 kg (203 lb 8 oz), SpO2 95 %.  PHYSICAL EXAMINATION:   Physical Exam  GENERAL:  82 y.o.-year-old patient lying in the bed with no acute distress.  EYES: Pupils equal, round, reactive to light and accommodation. No scleral icterus. Extraocular muscles intact.  HEENT: Head atraumatic, normocephalic. Oropharynx and nasopharynx clear.  NECK:  Supple, no jugular venous distention. No thyroid enlargement, no tenderness.  LUNGS: Normal breath sounds bilaterally, no wheezing, rales, rhonchi. No use of accessory muscles of respiration.  CARDIOVASCULAR: S1, S2 normal. No murmurs, rubs, or gallops.  ABDOMEN: Soft, nontender, nondistended. Bowel sounds present. No organomegaly or mass.  EXTREMITIES: No cyanosis, clubbing or edema b/l.    NEUROLOGIC: Cranial nerves II through XII are intact. No focal Motor or sensory deficits b/l.   PSYCHIATRIC:  patient is alert and oriented x 3.  SKIN: No obvious rash, lesion, or ulcer.   LABORATORY PANEL:  CBC  Recent Labs Lab 01/28/16 1655  WBC 21.7*  HGB 13.7  HCT 39.9*  PLT 279    Chemistries   Recent Labs Lab 01/28/16 1655  NA 134*  K 4.6  CL 100*  CO2 23  GLUCOSE 278*  BUN 23*  CREATININE 1.43*  CALCIUM 8.8*  AST 31  ALT 32  ALKPHOS 61  BILITOT 0.6   Cardiac Enzymes  Recent Labs Lab  01/28/16 1655  TROPONINI <0.03   RADIOLOGY:  Ct Chest Wo Contrast  Result Date: 01/28/2016 CLINICAL DATA:  Right upper lobe mass on chest radiograph, cough, cold, fever EXAM: CT CHEST WITHOUT CONTRAST TECHNIQUE: Multidetector CT imaging of the chest was performed following the standard protocol without IV contrast. COMPARISON:  Chest radiographs dated 01/20/2016 FINDINGS: Cardiovascular: Mild cardiomegaly.  No pericardial effusion. Coronary atherosclerosis in the LAD and right coronary artery. Atherosclerotic calcifications of the aortic arch. Mediastinum/Nodes: Small thoracic lymph nodes, including a 12 mm short axis low right paratracheal node (series 2/ image 46) and a 10 mm short axis right hilar node (series 2/ image 63). Visualized thyroid is unremarkable. Lungs/Pleura: 4.0 x 3.2 x 4.3 cm lateral right upper lobe mass (series 3/ image 46). Associated pleural retraction (series 3/ image 41). Tree-in-bud nodularity in the left lower lobe (series 3/image 103), suspicious for pneumonia. Metastatic disease is considered unlikely given the appearance, particularly at the medial left lung base (series 3/image 112). Small left upper lobe pulmonary nodules measuring up to 8 mm in the medial left upper lobe (series 3/ image 46), indeterminate for infection versus contralateral metastasis. No pleural effusion or pneumothorax. Upper Abdomen: Visualize upper abdomen is notable for hepatic steatosis and vascular calcifications. Musculoskeletal: Degenerative changes of the visualized thoracolumbar spine. IMPRESSION: 4.3 cm lateral right upper lobe mass with associated pleural retraction, highly suspicious for primary bronchogenic neoplasm. Percutaneous sampling is suggested. Nodularity in the  left lower lobe, suspicious for pneumonia. Additional nodularity in the left upper lobe measuring up to 8 mm, indeterminate. Attention on follow-up is suggested. Small mediastinal and right hilar lymph nodes, suspicious for  nodal metastases. Electronically Signed   By: Julian Hy M.D.   On: 01/28/2016 19:08   Dg Chest Port 1 View  Result Date: 01/28/2016 CLINICAL DATA:  Fever, cold for past 2 weeks, nasal drainage, vomiting. EXAM: PORTABLE CHEST 1 VIEW COMPARISON:  None. FINDINGS: There is a masslike opacity in the right upper lung measuring 3.4 x 3.2 cm. Lungs otherwise clear. No pleural effusion or pneumothorax seen. Mild cardiomegaly. Atherosclerotic changes noted at the aortic arch and there is expected age related aortic ectasia. Osseous structures about the chest are unremarkable. IMPRESSION: 1. Masslike opacity in the right upper lung measuring 3.4 x 3.2 cm. This could be a rounded pneumonia but is more likely a neoplastic mass. Recommend chest CT for further characterization. 2. Mild cardiomegaly. 3. Aortic atherosclerosis. Electronically Signed   By: Franki Cabot M.D.   On: 01/28/2016 17:35   ASSESSMENT AND PLAN:  Joel Grant  is a 82 y.o. male presents with having a cold for 2 weeks. He's been taking Aleve as outpatient. He's been coughing up some yellow phlegm. Had a temperature of 101 and low oxygen level.He is also been having coughing spells and vomiting because of the phlegm. In the ER, he was found to have a masslike opacity in the right upper lung, fever, tachycardia, lactic acid elevation in white blood cell count elevation  1. Clinical sepsis with pneumonia, fever, tachycardia and leukocytosis.  -IV Rocephin and Zithromax -low grade fever -Patient also has lactic acidosis secondary to sepsis. -f/u BC  2. Lactic acidosis. Hold Glucophage because this can also cause lactic acidosis. -LA normal -resume metformin  3. Masslike opacity right upper lung. CT scan of the chest for further evaluation. Treat for pneumonia currently. -CT chest confirms lung mass. Oncology consult and then w/u as out pt once pneumonia resolves  4. Type 2 diabetes mellitus. Sliding scale insulin for now. -resume  metformin  5. Dementia -pt has been having issues with memory. He was combative received IM haldol  6. Psoriasis left leg.  7. Acute kidney injury on chronic kidney disease stage III. Gentle IV fluids.  D/w wife and son Case discussed with Care Management/Social Worker. Management plans discussed with the patient, family and they are in agreement.  CODE STATUS: FULL code (per family) DVT Prophylaxis: lovenox  TOTAL TIME TAKING CARE OF THIS PATIENT:30 minutes.  >50% time spent on counselling and coordination of care  POSSIBLE D/C IN 1-2 DAYS, DEPENDING ON CLINICAL CONDITION.  Note: This dictation was prepared with Dragon dictation along with smaller phrase technology. Any transcriptional errors that result from this process are unintentional.  Camrin Lapre M.D on 01/29/2016 at 8:06 AM  Between 7am to 6pm - Pager - [phone]  After 6pm go to [email or web address] - password [email or web address] [place] [facility]  Office  [phone]  CC: Primary care physician; Wilhemena Durie, MD

## 2016-01-30 ENCOUNTER — Telehealth: Payer: Self-pay | Admitting: Family Medicine

## 2016-01-30 LAB — CBC
HCT: 37.7 % — ABNORMAL LOW (ref 40.0–52.0)
HEMOGLOBIN: 13.1 g/dL (ref 13.0–18.0)
MCH: 30.6 pg (ref 26.0–34.0)
MCHC: 34.7 g/dL (ref 32.0–36.0)
MCV: 88 fL (ref 80.0–100.0)
Platelets: 239 10*3/uL (ref 150–440)
RBC: 4.28 MIL/uL — AB (ref 4.40–5.90)
RDW: 13.9 % (ref 11.5–14.5)
WBC: 12.6 10*3/uL — ABNORMAL HIGH (ref 3.8–10.6)

## 2016-01-30 LAB — GLUCOSE, CAPILLARY
GLUCOSE-CAPILLARY: 204 mg/dL — AB (ref 65–99)
Glucose-Capillary: 257 mg/dL — ABNORMAL HIGH (ref 65–99)

## 2016-01-30 LAB — BASIC METABOLIC PANEL
ANION GAP: 7 (ref 5–15)
BUN: 21 mg/dL — ABNORMAL HIGH (ref 6–20)
CALCIUM: 8.5 mg/dL — AB (ref 8.9–10.3)
CHLORIDE: 104 mmol/L (ref 101–111)
CO2: 24 mmol/L (ref 22–32)
CREATININE: 1.11 mg/dL (ref 0.61–1.24)
GFR calc Af Amer: >60 mL/min (ref >60)
GFR calc non Af Amer: 58 mL/min — ABNORMAL LOW (ref >60)
GLUCOSE: 205 mg/dL — AB (ref 65–99)
Potassium: 4.1 mmol/L (ref 3.5–5.1)
Sodium: 135 mmol/L (ref 135–145)

## 2016-01-30 LAB — URINE CULTURE

## 2016-01-30 MED ORDER — AZITHROMYCIN 250 MG PO TABS: ORAL_TABLET | ORAL | 0 refills | Status: DC

## 2016-01-30 MED ORDER — AZITHROMYCIN 250 MG PO TABS: 250.0000 mg | ORAL_TABLET | Freq: Every day | ORAL | Status: DC

## 2016-01-30 MED ORDER — CEFUROXIME AXETIL 500 MG PO TABS: 500.0000 mg | ORAL_TABLET | Freq: Two times a day (BID) | ORAL | 0 refills | Status: DC

## 2016-01-30 MED ORDER — CEFUROXIME AXETIL 500 MG PO TABS
500.0000 mg | ORAL_TABLET | Freq: Two times a day (BID) | ORAL | Status: DC
  Filled 2016-01-30: qty 1

## 2016-01-30 MED ORDER — METFORMIN HCL 500 MG PO TABS: 1000.0000 mg | ORAL_TABLET | Freq: Every day | ORAL | Status: DC

## 2016-01-30 NOTE — Progress Notes (Signed)
Inpatient Diabetes Program Recommendations  AACE/ADA: New Consensus Statement on Inpatient Glycemic Control (2015)  Target Ranges:  Prepandial:   less than 140 mg/dL      Peak postprandial:   less than 180 mg/dL (1-2 hours)      Critically ill patients:  140 - 180 mg/dL   Lab Results  Component Value Date   GLUCAP 204 (H) 01/30/2016   HGBA1C 9.2 08/15/2015    Review of Glycemic Control:  Results for Joel Grant, Joel Grant (MRN [id number]) as of 01/30/2016 10:40  Ref. Range 01/29/2016 08:02 01/29/2016 11:35 01/29/2016 16:36 01/29/2016 21:16 01/30/2016 07:27  Glucose-Capillary Latest Ref Range: 65 - 99 mg/dL 206 (H) 242 (H) 253 (H) 225 (H) 204 (H)   Diabetes history: Type 2 diabetes Outpatient Diabetes medications: Metformin 1000 mg with supper Current orders for Inpatient glycemic control:  Novolog sensitive tid with meals and HS  Inpatient Diabetes Program Recommendations:   Please consider adding Lantus 15 units daily while patient is in the hospital.   Thanks, Adah Perl, RN, BC-ADM Inpatient Diabetes Coordinator Pager [phone] (8a-5p)

## 2016-01-30 NOTE — Care Management (Signed)
Case discussed with attending. Pt has no home health needs.

## 2016-01-30 NOTE — Telephone Encounter (Signed)
Please review-aa 

## 2016-01-30 NOTE — Discharge Summary (Signed)
[facility] [place] NAME: Joel Grant    MR#:  [id number]  DATE OF BIRTH:  04/28/1926  DATE OF ADMISSION:  01/28/2016 ADMITTING PHYSICIAN: Loletha Grayer, MD  DATE OF DISCHARGE: 01/30/16  PRIMARY CARE PHYSICIAN: Wilhemena Durie, MD    ADMISSION DIAGNOSIS:  Mass in chest [R22.2] Sepsis, due to unspecified organism [facility]) [A41.9] Community acquired pneumonia of right lung, unspecified part of lung [J18.9]  DISCHARGE DIAGNOSIS:  Sepsis on admission-resolved Right UL mass with pneumonia Acute renal failure-resolved Dementia/memory loss Dm-2  SECONDARY DIAGNOSIS:   Past Medical History:  Diagnosis Date  . Dementia   . Diabetes mellitus without complication ([place])   . Psoriasis   . Tuberculosis    per wife as a child    HOSPITAL COURSE:  Joel Grant a 22 y.o.malepresents with having a cold for 2 weeks. He's been taking Aleve as outpatient. He's been coughing up some yellow phlegm. Had a temperature of 101 and low oxygen level.He is also been having coughing spells and vomiting because of the phlegm. In the ER, he was found to have a masslike opacity in the right upper lung, fever, tachycardia, lactic acid elevation in white blood cell count elevation  1. Clinical sepsis with pneumonia, fever, tachycardia and leukocytosis.  -IV Rocephin and Zithromax---change to po abxs -low grade fever -Patient also has lactic acidosis secondary to sepsis. -f/u BC negative -wbc down to 12K  2. Lactic acidosis. Hold Glucophage because this can also cause lactic acidosis. -LA normal -resume metformin  3. Masslike opacity right upper lung. CT scan of the chest for further evaluation. Treat for pneumonia currently. -CT chest confirms lung mass.  Pt will see Dr Grayland Ormond as outpt. D/w pt's family and dr Grayland Ormond  4. Type 2 diabetes mellitus. Sliding scale insulin for now. -resume metformin  5. Dementia -pt has been having  issues with memory. He was combative received IM haldol  6. Psoriasis left leg.  7. Acute kidney injury on chronic kidney disease stage III.  -creat stable  Overall at baseline D/c home  CONSULTS OBTAINED:  Treatment Team:  Lloyd Huger, MD  DRUG ALLERGIES:   Allergies  Allergen Reactions  . Ciprofloxacin Anaphylaxis  . Hydrocodone-Acetaminophen Nausea And Vomiting  . Penicillins     rash  . Sulfa Antibiotics     DISCHARGE MEDICATIONS:   Current Discharge Medication List    START taking these medications   Details  azithromycin (ZITHROMAX) 250 MG tablet Take as directed Qty: 4 each, Refills: 0    cefUROXime (CEFTIN) 500 MG tablet Take 1 tablet (500 mg total) by mouth 2 (two) times daily with a meal. Qty: 12 tablet, Refills: 0      CONTINUE these medications which have NOT CHANGED   Details  metFORMIN (GLUCOPHAGE) 1000 MG tablet Take 1 tablet (1,000 mg total) by mouth daily after supper. Qty: 90 tablet, Refills: 3   Associated Diagnoses: Type 2 diabetes mellitus without complication, without long-term current use of insulin (HCC)    [employer] ULTRA THIN LANCETS MISC USE 1 LANCET TO CHECK SUGAR TWICE A DAY Qty: 200 each, Refills: 0    glucose blood (FREESTYLE LITE) test strip Check sugar once daily. DX: E11.9 Qty: 50 each, Refills: 12        If you experience worsening of your admission symptoms, develop shortness of breath, life threatening emergency, suicidal or homicidal thoughts you must seek medical attention immediately by calling 911 or calling your MD  immediately  if symptoms less severe.  You Must read complete instructions/literature along with all the possible adverse reactions/side effects for all the Medicines you take and that have been prescribed to you. Take any new Medicines after you have completely understood and accept all the possible adverse reactions/side effects.   Please note  You were cared for by a hospitalist during your hospital  stay. If you have any questions about your discharge medications or the care you received while you were in the hospital after you are discharged, you can call the unit and asked to speak with the hospitalist on call if the hospitalist that took care of you is not available. Once you are discharged, your primary care physician will handle any further medical issues. Please note that NO REFILLS for any discharge medications will be authorized once you are discharged, as it is imperative that you return to your primary care physician (or establish a relationship with a primary care physician if you do not have one) for your aftercare needs so that they can reassess your need for medications and monitor your lab values. Today   SUBJECTIVE   Doing well. Eating lunch. Family in the room  VITAL SIGNS:  Blood pressure 139/77, pulse 79, temperature 97.7 F (36.5 C), resp. rate 19, height '5\' 8"'$  (1.727 m), weight 92.3 kg (203 lb 8 oz), SpO2 92 %.  I/O:   Intake/Output Summary (Last 24 hours) at 01/30/16 1222 Last data filed at 01/30/16 0923  Gross per 24 hour  Intake              360 ml  Output              225 ml  Net              135 ml    PHYSICAL EXAMINATION:  GENERAL:  82 y.o.-year-old patient lying in the bed with no acute distress.  EYES: Pupils equal, round, reactive to light and accommodation. No scleral icterus. Extraocular muscles intact.  HEENT: Head atraumatic, normocephalic. Oropharynx and nasopharynx clear.  NECK:  Supple, no jugular venous distention. No thyroid enlargement, no tenderness.  LUNGS: Normal breath sounds bilaterally, no wheezing, rales,rhonchi or crepitation. No use of accessory muscles of respiration.  CARDIOVASCULAR: S1, S2 normal. No murmurs, rubs, or gallops.  ABDOMEN: Soft, non-tender, non-distended. Bowel sounds present. No organomegaly or mass.  EXTREMITIES: No pedal edema, cyanosis, or clubbing.  NEUROLOGIC: Cranial nerves II through XII are intact. Muscle  strength 5/5 in all extremities. Sensation intact. Gait not checked.  PSYCHIATRIC: The patient is alert and oriented x 3.  SKIN: No obvious rash, lesion, or ulcer.   DATA REVIEW:   CBC   Recent Labs Lab 01/30/16 0448  WBC 12.6*  HGB 13.1  HCT 37.7*  PLT 239    Chemistries   Recent Labs Lab 01/28/16 1655 01/30/16 0448  NA 134* 135  K 4.6 4.1  CL 100* 104  CO2 23 24  GLUCOSE 278* 205*  BUN 23* 21*  CREATININE 1.43* 1.11  CALCIUM 8.8* 8.5*  AST 31  --   ALT 32  --   ALKPHOS 61  --   BILITOT 0.6  --     Microbiology Results   Recent Results (from the past 240 hour(s))  Blood Culture (routine x 2)     Status: None (Preliminary result)   Collection Time: 01/28/16  4:55 PM  Result Value Ref Range Status   Specimen Description BLOOD RIGHT HAND  Final   Special Requests BOTTLES DRAWN AEROBIC AND ANAEROBIC 1CC  Final   Culture NO GROWTH 2 DAYS  Final   Report Status PENDING  Incomplete  Blood Culture (routine x 2)     Status: None (Preliminary result)   Collection Time: 01/28/16  4:55 PM  Result Value Ref Range Status   Specimen Description BLOOD LEFT FOREARM  Final   Special Requests BOTTLES DRAWN AEROBIC AND ANAEROBIC 5CC  Final   Culture NO GROWTH 2 DAYS  Final   Report Status PENDING  Incomplete  Urine culture     Status: Abnormal   Collection Time: 01/28/16  7:18 PM  Result Value Ref Range Status   Specimen Description URINE, RANDOM  Final   Special Requests NONE  Final   Culture (A)  Final    <10,000 COLONIES/mL INSIGNIFICANT GROWTH Performed at [facility]    Report Status 01/30/2016 FINAL  Final    RADIOLOGY:  Ct Chest Wo Contrast  Result Date: 01/28/2016 CLINICAL DATA:  Right upper lobe mass on chest radiograph, cough, cold, fever EXAM: CT CHEST WITHOUT CONTRAST TECHNIQUE: Multidetector CT imaging of the chest was performed following the standard protocol without IV contrast. COMPARISON:  Chest radiographs dated 01/20/2016 FINDINGS:  Cardiovascular: Mild cardiomegaly.  No pericardial effusion. Coronary atherosclerosis in the LAD and right coronary artery. Atherosclerotic calcifications of the aortic arch. Mediastinum/Nodes: Small thoracic lymph nodes, including a 12 mm short axis low right paratracheal node (series 2/ image 46) and a 10 mm short axis right hilar node (series 2/ image 63). Visualized thyroid is unremarkable. Lungs/Pleura: 4.0 x 3.2 x 4.3 cm lateral right upper lobe mass (series 3/ image 46). Associated pleural retraction (series 3/ image 41). Tree-in-bud nodularity in the left lower lobe (series 3/image 103), suspicious for pneumonia. Metastatic disease is considered unlikely given the appearance, particularly at the medial left lung base (series 3/image 112). Small left upper lobe pulmonary nodules measuring up to 8 mm in the medial left upper lobe (series 3/ image 46), indeterminate for infection versus contralateral metastasis. No pleural effusion or pneumothorax. Upper Abdomen: Visualize upper abdomen is notable for hepatic steatosis and vascular calcifications. Musculoskeletal: Degenerative changes of the visualized thoracolumbar spine. IMPRESSION: 4.3 cm lateral right upper lobe mass with associated pleural retraction, highly suspicious for primary bronchogenic neoplasm. Percutaneous sampling is suggested. Nodularity in the left lower lobe, suspicious for pneumonia. Additional nodularity in the left upper lobe measuring up to 8 mm, indeterminate. Attention on follow-up is suggested. Small mediastinal and right hilar lymph nodes, suspicious for nodal metastases. Electronically Signed   By: Julian Hy M.D.   On: 01/28/2016 19:08   Dg Chest Port 1 View  Result Date: 01/28/2016 CLINICAL DATA:  Fever, cold for past 2 weeks, nasal drainage, vomiting. EXAM: PORTABLE CHEST 1 VIEW COMPARISON:  None. FINDINGS: There is a masslike opacity in the right upper lung measuring 3.4 x 3.2 cm. Lungs otherwise clear. No pleural  effusion or pneumothorax seen. Mild cardiomegaly. Atherosclerotic changes noted at the aortic arch and there is expected age related aortic ectasia. Osseous structures about the chest are unremarkable. IMPRESSION: 1. Masslike opacity in the right upper lung measuring 3.4 x 3.2 cm. This could be a rounded pneumonia but is more likely a neoplastic mass. Recommend chest CT for further characterization. 2. Mild cardiomegaly. 3. Aortic atherosclerosis. Electronically Signed   By: Franki Cabot M.D.   On: 01/28/2016 17:35     Management plans discussed with the patient, family and they  are in agreement.  CODE STATUS:     Code Status Orders        Start     Ordered   01/28/16 1831  Full code  Continuous     01/28/16 1831    Code Status History    Date Active Date Inactive Code Status Order ID Comments User Context   This patient has a current code status but no historical code status.    Advance Directive Documentation   [place] Most Recent Value  Type of Advance Directive  Living will  Pre-existing out of facility DNR order (yellow form or pink MOST form)  No data  "MOST" Form in Place?  No data      TOTAL TIME TAKING CARE OF THIS PATIENT: 40 minutes.    Anastashia Westerfeld M.D on 01/30/2016 at 12:22 PM  Between 7am to 6pm - Pager - [phone] After 6pm go to [email or web address] - password [email or web address] [facility]  Office  [phone]  CC: Primary care physician; Wilhemena Durie, MD

## 2016-01-30 NOTE — Progress Notes (Signed)
Pt being discharged home today, PIV removed. Discharge instructions reviewed with pt/family, all questions answered. Prescriptions were sent to pt pharmacy for pickup. Appointment has been set up to see Oncologist as outpatient as well as PCP for hospital follow up. He is leaving with all his belongings, will be transported home via family member.

## 2016-01-30 NOTE — Telephone Encounter (Signed)
Pt is being discharged from [facility] today for pneumonia.  I have scheduled a hospital follow up appointment/MW

## 2016-01-30 NOTE — Telephone Encounter (Signed)
Please call today/tomorrow for transition care call

## 2016-01-31 NOTE — Telephone Encounter (Signed)
Spoke with wife, patient is still coughing, fatigue. Patient had antibiotics done through IV. Patient is feeling much better. Patient has medications to take at home. Patient has appointment to follow up with [facility]

## 2016-02-02 LAB — CULTURE, BLOOD (ROUTINE X 2)
CULTURE: NO GROWTH
Culture: NO GROWTH

## 2016-02-05 DIAGNOSIS — R918 Other nonspecific abnormal finding of lung field: Secondary | ICD-10-CM | POA: Insufficient documentation

## 2016-02-05 NOTE — Progress Notes (Signed)
Joel Grant  [phone] [phone]  ID: Johnston Ebbs OB: [birth year]  MR#: [id number]  UUV#:[id number]  Patient Care Team: Jerrol Banana., MD as PCP - General (Family Medicine)  CHIEF COMPLAINT: Right upper lobe lung mass.  INTERVAL HISTORY: Patient is an 82 year old male with underlying dementia who was recently admitted to hospital with acute renal failure and pneumonia. Subsequent workup included a CT of the chest that revealed a right upper lobe lung mass highly suspicious for underlying malignancy. Currently, patient feels well and is back to his baseline. He has no neurologic complaints. He denies any recent fevers. He has a good appetite and denies weight loss. He denies any chest pain, shortness of breath, cough, or hemoptysis.  He has no nausea, vomiting, constipation, or diarrhea. He has no urinary complaints. Patient offers no specific complaints today.   REVIEW OF SYSTEMS:   Review of Systems  Constitutional: Negative.  Negative for fever, malaise/fatigue and weight loss.  Respiratory: Negative.  Negative for cough, hemoptysis, sputum production and shortness of breath.   Cardiovascular: Negative.  Negative for chest pain and leg swelling.  Gastrointestinal: Negative.  Negative for abdominal pain.  Genitourinary: Negative.   Musculoskeletal: Negative.   Neurological: Negative.  Negative for weakness.  Psychiatric/Behavioral: Positive for memory loss. The patient is not nervous/anxious.     As per HPI. Otherwise, a complete review of systems is negative.  PAST MEDICAL HISTORY: Past Medical History:  Diagnosis Date  . Dementia   . Diabetes mellitus without complication ([place])   . Psoriasis   . Tuberculosis    per wife as a child    PAST SURGICAL HISTORY: Past Surgical History:  Procedure Laterality Date  . CATARACT EXTRACTION      FAMILY HISTORY: Family History  Problem Relation Age of Onset  . Heart disease Mother    . Dementia Mother   . Diabetes Father   . Heart disease Father   . Peripheral vascular disease Brother   . Diabetes Brother     ADVANCED DIRECTIVES (Y/N):  N  HEALTH MAINTENANCE: Social History  Substance Use Topics  . Smoking status: Former Research scientist (life sciences)  . Smokeless tobacco: Never Used  . Alcohol use No     Colonoscopy:  PAP:  Bone density:  Lipid panel:  Allergies  Allergen Reactions  . Ciprofloxacin Anaphylaxis  . Hydrocodone-Acetaminophen Nausea And Vomiting  . Penicillins     rash  . Sulfa Antibiotics     Current Outpatient Prescriptions  Medication Sig Dispense Refill  . azithromycin (ZITHROMAX) 250 MG tablet Take 250 mg by mouth daily.    . cefUROXime (CEFTIN) 500 MG tablet Take 500 mg by mouth 2 (two) times daily with a meal.    . metFORMIN (GLUCOPHAGE) 1000 MG tablet Take 1 tablet (1,000 mg total) by mouth daily after supper. 90 tablet 3   No current facility-administered medications for this visit.     OBJECTIVE: Vitals:   02/07/16 1537  BP: (!) 149/79  Pulse: 87  Resp: 18  Temp: 97.7 F (36.5 C)     Body mass index is 30.45 kg/m.    ECOG FS:1 - Symptomatic but completely ambulatory  General: Well-developed, well-nourished, no acute distress. Eyes: Pink conjunctiva, anicteric sclera. HEENT: Normocephalic, moist mucous membranes, clear oropharnyx. Lungs: Clear to auscultation bilaterally. Heart: Regular rate and rhythm. No rubs, murmurs, or gallops. Abdomen: Soft, nontender, nondistended. No organomegaly noted, normoactive bowel sounds. Musculoskeletal: No edema, cyanosis, or clubbing. Neuro: Alert, answering all  questions appropriately. Cranial nerves grossly intact. Skin: No rashes or petechiae noted. Psych: Normal affect. Lymphatics: No cervical, calvicular, axillary or inguinal LAD.   LAB RESULTS:  Lab Results  Component Value Date   NA 137 02/06/2016   K 4.9 02/06/2016   CL 96 02/06/2016   CO2 24 02/06/2016   GLUCOSE 235 (H) 02/06/2016    BUN 22 02/06/2016   CREATININE 1.20 02/06/2016   CALCIUM 9.2 02/06/2016   PROT 7.1 01/28/2016   ALBUMIN 3.7 02/06/2016   AST 31 01/28/2016   ALT 32 01/28/2016   ALKPHOS 61 01/28/2016   BILITOT 0.6 01/28/2016   GFRNONAA 54 (L) 02/06/2016   GFRAA 62 02/06/2016    Lab Results  Component Value Date   WBC 12.6 (H) 01/30/2016   NEUTROABS 19.0 (H) 01/28/2016   HGB 13.1 01/30/2016   HCT 37.7 (L) 01/30/2016   MCV 88.0 01/30/2016   PLT 239 01/30/2016     STUDIES: Ct Chest Wo Contrast  Result Date: 01/28/2016 CLINICAL DATA:  Right upper lobe mass on chest radiograph, cough, cold, fever EXAM: CT CHEST WITHOUT CONTRAST TECHNIQUE: Multidetector CT imaging of the chest was performed following the standard protocol without IV contrast. COMPARISON:  Chest radiographs dated 01/20/2016 FINDINGS: Cardiovascular: Mild cardiomegaly.  No pericardial effusion. Coronary atherosclerosis in the LAD and right coronary artery. Atherosclerotic calcifications of the aortic arch. Mediastinum/Nodes: Small thoracic lymph nodes, including a 12 mm short axis low right paratracheal node (series 2/ image 46) and a 10 mm short axis right hilar node (series 2/ image 63). Visualized thyroid is unremarkable. Lungs/Pleura: 4.0 x 3.2 x 4.3 cm lateral right upper lobe mass (series 3/ image 46). Associated pleural retraction (series 3/ image 41). Tree-in-bud nodularity in the left lower lobe (series 3/image 103), suspicious for pneumonia. Metastatic disease is considered unlikely given the appearance, particularly at the medial left lung base (series 3/image 112). Small left upper lobe pulmonary nodules measuring up to 8 mm in the medial left upper lobe (series 3/ image 46), indeterminate for infection versus contralateral metastasis. No pleural effusion or pneumothorax. Upper Abdomen: Visualize upper abdomen is notable for hepatic steatosis and vascular calcifications. Musculoskeletal: Degenerative changes of the visualized  thoracolumbar spine. IMPRESSION: 4.3 cm lateral right upper lobe mass with associated pleural retraction, highly suspicious for primary bronchogenic neoplasm. Percutaneous sampling is suggested. Nodularity in the left lower lobe, suspicious for pneumonia. Additional nodularity in the left upper lobe measuring up to 8 mm, indeterminate. Attention on follow-up is suggested. Small mediastinal and right hilar lymph nodes, suspicious for nodal metastases. Electronically Signed   By: Julian Hy M.D.   On: 01/28/2016 19:08   Dg Chest Port 1 View  Result Date: 01/28/2016 CLINICAL DATA:  Fever, cold for past 2 weeks, nasal drainage, vomiting. EXAM: PORTABLE CHEST 1 VIEW COMPARISON:  None. FINDINGS: There is a masslike opacity in the right upper lung measuring 3.4 x 3.2 cm. Lungs otherwise clear. No pleural effusion or pneumothorax seen. Mild cardiomegaly. Atherosclerotic changes noted at the aortic arch and there is expected age related aortic ectasia. Osseous structures about the chest are unremarkable. IMPRESSION: 1. Masslike opacity in the right upper lung measuring 3.4 x 3.2 cm. This could be a rounded pneumonia but is more likely a neoplastic mass. Recommend chest CT for further characterization. 2. Mild cardiomegaly. 3. Aortic atherosclerosis. Electronically Signed   By: Franki Cabot M.D.   On: 01/28/2016 17:35    ASSESSMENT: Right upper lobe lung mass.  PLAN:  1. Right upper lobe lung mass: Highly suspicious for underlying malignancy. Given patient's advanced age and underlying dementia, treatment would be difficult but patient's family wishes to pursue biopsy for further evaluation. Patient would unlikely be able to tolerate a PET scan at this point. If patient proceeds treatment, would likely have to order an MRI of the brain for further evaluation. Return to clinic one week after the biopsy to discuss the results and treatment planning if desired. 2. Acute renal failure: Resolved. 3.  Dementia: Patient appears to be at his baseline.  Approximately 45 minutes was spent in discussion of which greater than 50% was consultation.  Patient expressed understanding and was in agreement with this plan. He also understands that He can call clinic at any time with any questions, concerns, or complaints.   No matching staging information was found for the patient.  Lloyd Huger, MD   02/13/2016 10:49 AM

## 2016-02-06 ENCOUNTER — Encounter: Payer: Self-pay | Admitting: Family Medicine

## 2016-02-06 ENCOUNTER — Ambulatory Visit (INDEPENDENT_AMBULATORY_CARE_PROVIDER_SITE_OTHER): Payer: Medicare Other | Admitting: Family Medicine

## 2016-02-06 VITALS — BP 108/62 | HR 70 | Temp 98.9°F | Resp 16 | Wt 201.0 lb

## 2016-02-06 DIAGNOSIS — Z09 Encounter for follow-up examination after completed treatment for conditions other than malignant neoplasm: Secondary | ICD-10-CM | POA: Diagnosis not present

## 2016-02-06 DIAGNOSIS — J189 Pneumonia, unspecified organism: Secondary | ICD-10-CM

## 2016-02-06 DIAGNOSIS — E119 Type 2 diabetes mellitus without complications: Secondary | ICD-10-CM | POA: Diagnosis not present

## 2016-02-06 DIAGNOSIS — R222 Localized swelling, mass and lump, trunk: Secondary | ICD-10-CM

## 2016-02-06 DIAGNOSIS — E7800 Pure hypercholesterolemia, unspecified: Secondary | ICD-10-CM

## 2016-02-06 DIAGNOSIS — F0280 Dementia in other diseases classified elsewhere without behavioral disturbance: Secondary | ICD-10-CM | POA: Diagnosis not present

## 2016-02-06 DIAGNOSIS — G308 Other Alzheimer's disease: Secondary | ICD-10-CM

## 2016-02-06 DIAGNOSIS — J181 Lobar pneumonia, unspecified organism: Secondary | ICD-10-CM

## 2016-02-06 NOTE — Progress Notes (Signed)
Joel Grant  MRN: [id number] DOB: February 07, 1927  Subjective:  HPI  Patient is here for hospital follow up, hospital stay was 10/28-10/30. Diagnoses: sepsis on admission-resolved, RUL mass with pneumonia, acute renal failure, dementia, DM Treated with Zpak and Ceftin. Mass on the lung was confirmed by CT of the chest, patient was instructed to see Dr Grayland Ormond as outpatient. Patient states he feels great. No lingering symptoms. He is scheduled to see Dr Grayland Ormond tomorrow. He is in a good mood today and says he feels well. His wife confirms that his memory is gradually getting worse.  Patient Active Problem List   Diagnosis Date Noted  . Mass of upper lobe of right lung 02/05/2016  . Sepsis ([place]) 01/28/2016  . Adenomatous colon polyp 12/09/2014  . Alzheimer's dementia 12/09/2014  . 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 Diabetes ([place]) 12/09/2014  . Glaucoma 12/09/2014  . Adult hypothyroidism 12/09/2014  . Adiposity 12/09/2014  . Arthritis, degenerative 12/09/2014  . Hypercholesterolemia without hypertriglyceridemia 12/09/2014  . Avitaminosis D 12/09/2014    Past Medical History:  Diagnosis Date  . Dementia   . Diabetes mellitus without complication ([place])   . Psoriasis   . Tuberculosis    per wife as a child    Social History   Social History  . Marital status: Married    Spouse name: N/A  . Number of children: N/A  . Years of education: N/A   Occupational History  . Not on file.   Social History Main Topics  . Smoking status: Former Research scientist (life sciences)  . Smokeless tobacco: Never Used  . Alcohol use No  . Drug use: No  . Sexual activity: Not on file   Other Topics Concern  . Not on file   Social History Narrative  . No narrative on file    Outpatient Encounter Prescriptions as of 02/06/2016  Medication Sig  . [facility] ULTRA THIN LANCETS MISC USE 1 LANCET TO CHECK SUGAR TWICE A DAY  . glucose blood (FREESTYLE LITE) test strip Check sugar once daily. DX: E11.9  .  metFORMIN (GLUCOPHAGE) 1000 MG tablet Take 1 tablet (1,000 mg total) by mouth daily after supper.  . [DISCONTINUED] azithromycin (ZITHROMAX) 250 MG tablet Take as directed  . [DISCONTINUED] cefUROXime (CEFTIN) 500 MG tablet Take 1 tablet (500 mg total) by mouth 2 (two) times daily with a meal.   No facility-administered encounter medications on file as of 02/06/2016.     Allergies  Allergen Reactions  . Ciprofloxacin Anaphylaxis  . Hydrocodone-Acetaminophen Nausea And Vomiting  . Penicillins     rash  . Sulfa Antibiotics     Review of Systems  Constitutional: Negative.   Respiratory: Negative.   Cardiovascular: Negative.   Musculoskeletal: Negative.   Neurological: Negative for dizziness.  Psychiatric/Behavioral: Positive for memory loss.    Objective:  BP 108/62   Pulse 70   Temp 98.9 F (37.2 C)   Resp 16   Wt 201 lb (91.2 kg)   SpO2 96%   BMI 30.56 kg/m   Physical Exam  Constitutional: He is oriented to person, place, and time and well-developed, well-nourished, and in no distress.  HENT:  Head: Normocephalic and atraumatic.  Right Ear: External ear normal.  Left Ear: External ear normal.  Nose: Nose normal.  Eyes: Conjunctivae are normal. Pupils are equal, round, and reactive to light.  Neck: Normal range of motion. Neck supple.  Cardiovascular: Normal rate, regular rhythm, normal heart sounds and intact distal pulses.   No  murmur heard. Pulmonary/Chest: Effort normal and breath sounds normal. No respiratory distress. He has no wheezes.  Abdominal: Soft.  Musculoskeletal: He exhibits no edema or tenderness.  Neurological: He is alert and oriented to person, place, and time.  Skin: Skin is warm and dry.  Psychiatric: Mood and affect normal.    Assessment and Plan :  1. Hospital discharge follow-up records reviewed. Re check Renal panel-kidney function improved before patient left the hospital.  2. Pneumonia of right upper lobe Patient feels better. Follow.    3. Type 2 diabetes mellitus without complication, without long-term current use of insulin (HCC) Check A1C today.  4. Alzheimer's disease of other onset without behavioral disturbance Stable today. Actually this is the best of seen the patient looks in a couple of years. MMSE on next visit. I think he has mild to moderate Alzheimer's disease. 5. Hypercholesterolemia without hypertriglyceridemia Stable.  6. Chest mass Seen Dr Grayland Ormond tomorrow 02/07/16.  Follow  HPI, Exam and A&P transcribed under direction and in the presence of Miguel Aschoff, MD.  I have done the exam and reviewed the chart and it is accurate to the best of my knowledge. Miguel Aschoff M.D. [facility]

## 2016-02-07 ENCOUNTER — Encounter: Payer: Self-pay | Admitting: Oncology

## 2016-02-07 ENCOUNTER — Inpatient Hospital Stay: Payer: Medicare Other | Attending: Oncology | Admitting: Oncology

## 2016-02-07 VITALS — BP 149/79 | HR 87 | Temp 97.7°F | Resp 18 | Wt 200.3 lb

## 2016-02-07 DIAGNOSIS — R918 Other nonspecific abnormal finding of lung field: Secondary | ICD-10-CM | POA: Insufficient documentation

## 2016-02-07 DIAGNOSIS — Z880 Allergy status to penicillin: Secondary | ICD-10-CM | POA: Insufficient documentation

## 2016-02-07 DIAGNOSIS — E119 Type 2 diabetes mellitus without complications: Secondary | ICD-10-CM | POA: Insufficient documentation

## 2016-02-07 DIAGNOSIS — F0390 Unspecified dementia without behavioral disturbance: Secondary | ICD-10-CM | POA: Diagnosis not present

## 2016-02-07 DIAGNOSIS — Z79899 Other long term (current) drug therapy: Secondary | ICD-10-CM

## 2016-02-07 DIAGNOSIS — Z8701 Personal history of pneumonia (recurrent): Secondary | ICD-10-CM | POA: Diagnosis not present

## 2016-02-07 DIAGNOSIS — Z7984 Long term (current) use of oral hypoglycemic drugs: Secondary | ICD-10-CM | POA: Diagnosis not present

## 2016-02-07 DIAGNOSIS — Z87891 Personal history of nicotine dependence: Secondary | ICD-10-CM | POA: Insufficient documentation

## 2016-02-07 LAB — RENAL FUNCTION PANEL
ALBUMIN: 3.7 g/dL (ref 3.5–4.7)
BUN/Creatinine Ratio: 18 (ref 10–24)
BUN: 22 mg/dL (ref 8–27)
CALCIUM: 9.2 mg/dL (ref 8.6–10.2)
CO2: 24 mmol/L (ref 18–29)
Chloride: 96 mmol/L (ref 96–106)
Creatinine, Ser: 1.2 mg/dL (ref 0.76–1.27)
GFR calc Af Amer: 62 mL/min/{1.73_m2} (ref >59)
GFR, EST NON AFRICAN AMERICAN: 54 mL/min/{1.73_m2} — AB (ref >59)
GLUCOSE: 235 mg/dL — AB (ref 65–99)
PHOSPHORUS: 3.5 mg/dL (ref 2.5–4.5)
POTASSIUM: 4.9 mmol/L (ref 3.5–5.2)
SODIUM: 137 mmol/L (ref 134–144)

## 2016-02-07 LAB — HEMOGLOBIN A1C
ESTIMATED AVERAGE GLUCOSE: 220 mg/dL
HEMOGLOBIN A1C: 9.3 % — AB (ref 4.8–5.6)

## 2016-02-07 NOTE — Progress Notes (Signed)
New evaluation for lung mass. Offers no complaints.

## 2016-02-16 ENCOUNTER — Ambulatory Visit (INDEPENDENT_AMBULATORY_CARE_PROVIDER_SITE_OTHER): Payer: Medicare Other | Admitting: Family Medicine

## 2016-02-16 VITALS — BP 112/54 | HR 78 | Temp 98.1°F | Resp 16 | Ht 67.0 in | Wt 201.0 lb

## 2016-02-16 DIAGNOSIS — R918 Other nonspecific abnormal finding of lung field: Secondary | ICD-10-CM | POA: Diagnosis not present

## 2016-02-16 DIAGNOSIS — J189 Pneumonia, unspecified organism: Secondary | ICD-10-CM

## 2016-02-16 DIAGNOSIS — F0280 Dementia in other diseases classified elsewhere without behavioral disturbance: Secondary | ICD-10-CM | POA: Diagnosis not present

## 2016-02-16 DIAGNOSIS — J181 Lobar pneumonia, unspecified organism: Secondary | ICD-10-CM | POA: Diagnosis not present

## 2016-02-16 DIAGNOSIS — G301 Alzheimer's disease with late onset: Secondary | ICD-10-CM | POA: Diagnosis not present

## 2016-02-16 DIAGNOSIS — Z0000 Encounter for general adult medical examination without abnormal findings: Secondary | ICD-10-CM

## 2016-02-16 NOTE — Progress Notes (Signed)
Patient: Joel [facility], Male    DOB: 1926-11-24, 82 y.o.   MRN: [id number] Visit Date: 02/16/2016  Today's Provider: Wilhemena Durie, MD   Chief Complaint  Patient presents with  . Annual Exam   Subjective:   Joel Grant is a 82 y.o. male who presents today for his Subsequent Annual Wellness Visit. He feels well. He reports exercising none. He reports he is sleeping well. He has been married for 60 years and has 1 son and no grandchildren. He is a retired Armed forces training and education officer.  He is having progressive problems with cognition and dementia. He still lives alone with his wife but is not becoming physically or verbally abusive to his wife/family to this point. He had recent hospitalization for pneumonia and was found to have a mass on chest CT. He hjas a lung biopsy next week. Review of Systems  Constitutional: Negative.   HENT: Negative.   Eyes: Negative.   Respiratory: Negative.   Cardiovascular: Negative.   Gastrointestinal: Negative.   Endocrine: Negative.   Genitourinary: Negative.   Musculoskeletal: Negative.   Skin: Negative.   Allergic/Immunologic: Negative.   Neurological: Negative.   Hematological: Negative.   Psychiatric/Behavioral: Negative.     Patient Active Problem List   Diagnosis Date Noted  . Mass of upper lobe of right lung 02/05/2016  . Sepsis ([place]) 01/28/2016  . Adenomatous colon polyp 12/09/2014  . Alzheimer's dementia 12/09/2014  . 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 Diabetes ([place]) 12/09/2014  . Glaucoma 12/09/2014  . Adult hypothyroidism 12/09/2014  . Adiposity 12/09/2014  . Arthritis, degenerative 12/09/2014  . Hypercholesterolemia without hypertriglyceridemia 12/09/2014  . Avitaminosis D 12/09/2014    Social History   Social History  . Marital status: Married    Spouse name: N/A  . Number of children: N/A  . Years of education: N/A   Occupational History  . Not on file.   Social History Main Topics  . Smoking status: Former Research scientist (life sciences)   . Smokeless tobacco: Never Used  . Alcohol use No  . Drug use: No  . Sexual activity: Not on file   Other Topics Concern  . Not on file   Social History Narrative  . No narrative on file    Past Surgical History:  Procedure Laterality Date  . CATARACT EXTRACTION      His family history includes Dementia in his mother; Diabetes in his brother and father; Heart disease in his father and mother; Peripheral vascular disease in his brother.     Outpatient Encounter Prescriptions as of 02/16/2016  Medication Sig  . metFORMIN (GLUCOPHAGE) 1000 MG tablet Take 1 tablet (1,000 mg total) by mouth daily after supper.  . [DISCONTINUED] azithromycin (ZITHROMAX) 250 MG tablet Take 250 mg by mouth daily.  . [DISCONTINUED] cefUROXime (CEFTIN) 500 MG tablet Take 500 mg by mouth 2 (two) times daily with a meal.   No facility-administered encounter medications on file as of 02/16/2016.     Allergies  Allergen Reactions  . Ciprofloxacin Anaphylaxis  . Hydrocodone-Acetaminophen Nausea And Vomiting  . Penicillins     rash  . Sulfa Antibiotics     Patient Care Team: Jerrol Banana., MD as PCP - General (Family Medicine)   Objective:   Vitals:  Vitals:   02/16/16 0912  BP: (!) 112/54  Pulse: 78  Resp: 16  Temp: 98.1 F (36.7 C)  TempSrc: Oral  Weight: 201 lb (91.2 kg)  Height: '5\' 7"'$  (1.702 m)    Physical  Exam  Constitutional: He is oriented to person, place, and time. He appears well-developed and well-nourished.  HENT:  Head: Normocephalic and atraumatic.  Right Ear: External ear normal.  Left Ear: External ear normal.  Nose: Nose normal.  Mouth/Throat: Oropharynx is clear and moist.  Eyes: Conjunctivae and EOM are normal. Pupils are equal, round, and reactive to light.  Neck: Normal range of motion. Neck supple.  Cardiovascular: Normal rate, regular rhythm, normal heart sounds and intact distal pulses.   Pulmonary/Chest: Effort normal and breath sounds normal.   Abdominal: Soft. Bowel sounds are normal.  Musculoskeletal: Normal range of motion.  Neurological: He is alert and oriented to person, place, and time.  Skin: Skin is warm and dry.  Psychiatric: He has a normal mood and affect.    Activities of Daily Living In your present state of health, do you have any difficulty performing the following activities: 02/16/2016 01/29/2016  Hearing? N N  Vision? N N  Difficulty concentrating or making decisions? Tempie [place]  Walking or climbing stairs? N Y  Dressing or bathing? N Y  Doing errands, shopping? N N  Some recent data might be hidden   Home Exercise  02/16/2016  Current Exercise Habits The patient does not participate in regular exercise at present    Fall Risk Assessment Fall Risk  02/16/2016 12/13/2014  Falls in the past year? No No     Depression Screen PHQ 2/9 Scores 02/16/2016 12/13/2014  PHQ - 2 Score 0 0    Cognitive Testing - 6-CIT    Year: 0 4 points  Month: 0 3 points  Memorize "[name], [address] [place]"  Time (within 1 hour:) 0 3 points  Count backwards from 20: 0 2 4 points  Name months of year: 0 2 4 points  Repeat Address: 0 '2 4 6 8 10 '$ points   Total Score: 25/28  Interpretation : Normal (0-7) Abnormal (8-28)    Assessment & Plan:     Annual Wellness Visit  Reviewed patient's Family Medical History Reviewed and updated list of patient's medical providers Assessment of cognitive impairment was done Assessed patient's functional ability Established a written schedule for health screening [place] Completed and Reviewed  Exercise Activities and Dietary recommendations Goals    None      Immunization History  Administered Date(s) Administered  . Influenza, High Dose Seasonal PF 12/13/2014, 12/16/2015  . Pneumococcal Conjugate-13 03/18/2014  . Pneumococcal Polysaccharide-23 01/14/2013  . Td 11/02/2002    Health Maintenance  Topic Date Due  . OPHTHALMOLOGY EXAM   02/02/1939  . URINE MICROALBUMIN  02/02/1939  . ZOSTAVAX  02/01/1989  . TETANUS/TDAP  11/01/2012  . FOOT EXAM  04/17/2016  . HEMOGLOBIN A1C  08/05/2016  . INFLUENZA VACCINE  Completed  . PNA vac Low Risk Adult  Completed     Discussed health benefits of physical activity, and encouraged him to engage in regular exercise appropriate for his age and condition.   Pneumonia Abnormal chest CT Early Alzheimers Disease RTC 3 months  I have done the exam and reviewed the chart and it is accurate to the best of my knowledge. Development worker, community has been used and  any errors in dictation or transcription are unintentional. Miguel Aschoff M.D. [facility]     HPI, Exam and A&P Transcribed under the direction and in the presence of Wilhemena Durie., MD. Electronically Signed: Althea Charon, [place]

## 2016-02-17 ENCOUNTER — Other Ambulatory Visit: Payer: Self-pay | Admitting: Physician Assistant

## 2016-02-20 ENCOUNTER — Telehealth: Payer: Self-pay | Admitting: Interventional Radiology

## 2016-02-20 ENCOUNTER — Ambulatory Visit: Admission: RE | Admit: 2016-02-20 | Payer: Medicare Other | Source: Ambulatory Visit

## 2016-02-20 ENCOUNTER — Ambulatory Visit
Admission: RE | Admit: 2016-02-20 | Discharge: 2016-02-20 | Disposition: A | Discharge status: Home / Self Care | Payer: Medicare Other | Source: Ambulatory Visit | Attending: Oncology | Admitting: Oncology

## 2016-02-20 DIAGNOSIS — R918 Other nonspecific abnormal finding of lung field: Secondary | ICD-10-CM | POA: Diagnosis not present

## 2016-02-20 HISTORY — DX: Pneumonia, unspecified organism: J18.9

## 2016-02-20 HISTORY — DX: Dyspnea, unspecified: R06.00

## 2016-02-20 LAB — PROTIME-INR
INR: 1.08
Prothrombin Time: 14 seconds (ref 11.4–15.2)

## 2016-02-20 LAB — CBC
HCT: 39.3 % — ABNORMAL LOW (ref 40.0–52.0)
Hemoglobin: 13.7 g/dL (ref 13.0–18.0)
MCH: 30.3 pg (ref 26.0–34.0)
MCHC: 34.7 g/dL (ref 32.0–36.0)
MCV: 87.3 fL (ref 80.0–100.0)
Platelets: 213 K/uL (ref 150–440)
RBC: 4.5 MIL/uL (ref 4.40–5.90)
RDW: 13.8 % (ref 11.5–14.5)
WBC: 8.4 K/uL (ref 3.8–10.6)

## 2016-02-20 LAB — APTT: APTT: 27 s (ref 24–36)

## 2016-02-20 MED ORDER — SODIUM CHLORIDE 0.9 % IV SOLN: INTRAVENOUS | Status: DC

## 2016-02-20 NOTE — Telephone Encounter (Signed)
Risks and Benefits of CT guided lung biopsy were discussed with the patient (and the patient family -> wife and son) including, but not limited to bleeding, hemoptysis, respiratory failure requiring intubation, infection, pneumothorax requiring chest tube placement, stroke from air embolism or even death.   I explained my concern that this area within his right upper lobe is most likely a malignancy.  All of the patient's questions were answered.   Following this prolonged and detailed conversation, the patient does NOT wish to pursue CT guided lung biopsy and rather wishes to pursue either continued observation versus no treatment/survillenance.   Ronny Bacon, MD Pager #: [phone]

## 2016-02-22 ENCOUNTER — Telehealth: Payer: Self-pay | Admitting: *Deleted

## 2016-02-22 DIAGNOSIS — R918 Other nonspecific abnormal finding of lung field: Secondary | ICD-10-CM

## 2016-02-22 NOTE — Telephone Encounter (Signed)
Pt wishes to cancel CT guided biopsy on 11/28 and wishes to pursue observation of lung mass with another CT scan. Please advise.

## 2016-02-27 ENCOUNTER — Inpatient Hospital Stay: Payer: Medicare Other | Admitting: Oncology

## 2016-02-27 NOTE — Telephone Encounter (Signed)
Ok.  End of January or first week of February: ct chest with contrast see MD 1-2 days later.

## 2016-02-28 ENCOUNTER — Ambulatory Visit: Payer: Medicare Other

## 2016-02-28 NOTE — Telephone Encounter (Signed)
Orders are placed, message sent to scheduling and pt will be notified with appt.

## 2016-05-03 ENCOUNTER — Ambulatory Visit
Admission: RE | Admit: 2016-05-03 | Discharge: 2016-05-03 | Disposition: A | Discharge status: Home / Self Care | Payer: Medicare Other | Source: Ambulatory Visit | Attending: Oncology | Admitting: Oncology

## 2016-05-03 DIAGNOSIS — I2510 Atherosclerotic heart disease of native coronary artery without angina pectoris: Secondary | ICD-10-CM | POA: Insufficient documentation

## 2016-05-03 DIAGNOSIS — K760 Fatty (change of) liver, not elsewhere classified: Secondary | ICD-10-CM | POA: Insufficient documentation

## 2016-05-03 DIAGNOSIS — R918 Other nonspecific abnormal finding of lung field: Secondary | ICD-10-CM | POA: Insufficient documentation

## 2016-05-03 DIAGNOSIS — I700 Atherosclerosis of aorta: Secondary | ICD-10-CM | POA: Insufficient documentation

## 2016-05-03 DIAGNOSIS — E042 Nontoxic multinodular goiter: Secondary | ICD-10-CM | POA: Diagnosis not present

## 2016-05-03 DIAGNOSIS — J984 Other disorders of lung: Secondary | ICD-10-CM | POA: Diagnosis not present

## 2016-05-03 LAB — POCT I-STAT CREATININE: Creatinine, Ser: 1.3 mg/dL — ABNORMAL HIGH (ref 0.61–1.24)

## 2016-05-03 MED ORDER — IOPAMIDOL (ISOVUE-300) INJECTION 61%
75.0000 mL | Freq: Once | INTRAVENOUS | Status: AC | PRN
  Administered 2016-05-03: 75 mL via INTRAVENOUS

## 2016-05-06 NOTE — Progress Notes (Signed)
[place]  [phone]) [phone] [phone]) [phone]  ID: Joel Grant OB: 01/22/1927  MR#: [id number]  FTD#:[id number]  Patient Care Team: Jerrol Banana., MD as PCP - General (Family Medicine)  CHIEF COMPLAINT: Right upper lobe lung mass.  INTERVAL HISTORY: Patient returns to clinic today for further evaluation and discussion of his imaging results. Previous he, he is referred for biopsy, but patient ultimately refused. Given his underlying dementia, the entire history is given by his wife and son. Currently, patient feels well and is back to his baseline. He has no neurologic complaints. He denies any recent fevers. He has a good appetite and denies weight loss. He denies any chest pain, shortness of breath, cough, or hemoptysis.  He has no nausea, vomiting, constipation, or diarrhea. He has no urinary complaints. Patient offers no specific complaints today.   REVIEW OF SYSTEMS:   Review of Systems  Constitutional: Negative.  Negative for fever, malaise/fatigue and weight loss.  Respiratory: Negative.  Negative for cough, hemoptysis, sputum production and shortness of breath.   Cardiovascular: Negative.  Negative for chest pain and leg swelling.  Gastrointestinal: Negative.  Negative for abdominal pain.  Genitourinary: Negative.   Musculoskeletal: Negative.   Neurological: Negative.  Negative for weakness.  Psychiatric/Behavioral: Positive for memory loss. The patient is not nervous/anxious.     As per HPI. Otherwise, a complete review of systems is negative.  PAST MEDICAL HISTORY: Past Medical History:  Diagnosis Date  . Dementia   . Diabetes mellitus without complication ([place])   . Dyspnea   . Pneumonia   . Psoriasis   . Tuberculosis    per wife as a child    PAST SURGICAL HISTORY: Past Surgical History:  Procedure Laterality Date  . CATARACT EXTRACTION      FAMILY HISTORY: Family History  Problem Relation Age of Onset  . Heart disease  Mother   . Dementia Mother   . Diabetes Father   . Heart disease Father   . Peripheral vascular disease Brother   . Diabetes Brother     ADVANCED DIRECTIVES (Y/N):  N  HEALTH MAINTENANCE: Social History  Substance Use Topics  . Smoking status: Former Smoker    Quit date: 02/20/1976  . Smokeless tobacco: Never Used  . Alcohol use No     Colonoscopy:  PAP:  Bone density:  Lipid panel:  Allergies  Allergen Reactions  . Ciprofloxacin Anaphylaxis  . Hydrocodone-Acetaminophen Nausea And Vomiting  . Penicillins     rash  . Sulfa Antibiotics     Current Outpatient Prescriptions  Medication Sig Dispense Refill  . metFORMIN (GLUCOPHAGE) 1000 MG tablet Take 1 tablet (1,000 mg total) by mouth daily after supper. 90 tablet 3   No current facility-administered medications for this visit.     OBJECTIVE: Vitals:   05/07/16 0956  BP: (!) 144/84  Pulse: 67  Temp: 97.5 F (36.4 C)     Body mass index is 31.77 kg/m.    ECOG FS:1 - Symptomatic but completely ambulatory  General: Well-developed, well-nourished, no acute distress. Eyes: Pink conjunctiva, anicteric sclera. Lungs: Clear to auscultation bilaterally. Heart: Regular rate and rhythm. No rubs, murmurs, or gallops. Abdomen: Soft, nontender, nondistended. No organomegaly noted, normoactive bowel sounds. Musculoskeletal: No edema, cyanosis, or clubbing. Neuro: Alert. Cranial nerves grossly intact. Skin: No rashes or petechiae noted. Psych: Normal affect.    LAB RESULTS:  Lab Results  Component Value Date   NA 137 02/06/2016   K 4.9 02/06/2016  CL 96 02/06/2016   CO2 24 02/06/2016   GLUCOSE 235 (H) 02/06/2016   BUN 22 02/06/2016   CREATININE 1.30 (H) 05/03/2016   CALCIUM 9.2 02/06/2016   PROT 7.1 01/28/2016   ALBUMIN 3.7 02/06/2016   AST 31 01/28/2016   ALT 32 01/28/2016   ALKPHOS 61 01/28/2016   BILITOT 0.6 01/28/2016   GFRNONAA 54 (L) 02/06/2016   GFRAA 62 02/06/2016    Lab Results  Component  Value Date   WBC 8.4 02/20/2016   NEUTROABS 19.0 (H) 01/28/2016   HGB 13.7 02/20/2016   HCT 39.3 (L) 02/20/2016   MCV 87.3 02/20/2016   PLT 213 02/20/2016     STUDIES: Ct Chest W Contrast  Result Date: 05/03/2016 CLINICAL DATA:  Followup lung mass. EXAM: CT CHEST WITH CONTRAST TECHNIQUE: Multidetector CT imaging of the chest was performed during intravenous contrast administration. CONTRAST:  23m ISOVUE-300 IOPAMIDOL (ISOVUE-300) INJECTION 61% COMPARISON:  CT scan 01/28/2016 FINDINGS: Chest wall: No chest wall mass, supraclavicular or axillary lymphadenopathy. Multiple thyroid nodules are again noted. Cardiovascular: The heart is normal in size. No pericardial effusion. Stable tortuosity, ectasia and calcification of the thoracic aorta. No focal aneurysm or dissection. The branch vessels are patent. Stable coronary artery calcifications. Mediastinum/Nodes: Small scattered mediastinal and hilar lymph nodes are stable. Most of the these demonstrate calcifications. 11 mm right paratracheal lymph node is stable. The esophagus is grossly. Lungs/Pleura: Stable spiculated right upper lobe lung mass measuring 3.9 x 3.5 cm. Previously measured 4.0 x 3.3 cm. It comes close to contacting the adjacent pleura. Stable 5 mm pulmonary nodule in the right middle lobe on image number 73. Stable left upper lobe pulmonary nodule on image number 49 measuring 11 mm. Stable 3 mm right middle lobe nodule on image number 70 Stable 8 mm right lower lobe pulmonary nodule on image number 80. No new pulmonary lesions. A few tiny scattered calcified granulomas are again noted. No infiltrates or effusions. No pulmonary edema. Upper Abdomen: Severe diffuse fatty infiltration of the liver. No focal hepatic lesions or intrahepatic biliary dilatation. No adrenal gland lesions are identified. Musculoskeletal: No findings for osseous metastatic disease. Stable degenerative changes involving spine. IMPRESSION: 1. Stable large spiculated  right upper lobe lung mass measuring 3.9 x 3.5 cm. 2. 4 other stable pulmonary nodules as described above. 3. Stable scattered mediastinal and hilar lymph nodes are stable and most of these contain calcifications. 4. Stable aortic atherosclerosis and three-vessel coronary artery calcifications. 5. Severe diffuse fatty infiltration of liver. Electronically Signed   By: PMarijo SanesM.D.   On: 05/03/2016 11:51    ASSESSMENT: Right upper lobe lung mass.  PLAN:    1. Right upper lobe lung mass: Highly suspicious for underlying malignancy. CT results reviewed independently and reported as above. Given patient's advanced age and underlying dementia, treatment would be difficult. Patient was initially referred to biopsy, but ultimately refused and was unable to lie flat for the procedure. Hospice and palliative care were discussed, but patient and patient's family are not ready for this at this point. They wish to continue simple observation therefore will repeat CT scan in 3 months and patient will follow-up 1-2 days later.  2. Acute renal failure: Resolved. 3. Dementia: Patient appears to be at his baseline.  Approximately 30 minutes was spent in discussion of which greater than 50% was consultation.  Patient expressed understanding and was in agreement with this plan. He also understands that He can call clinic at any time with any  questions, concerns, or complaints.   Cancer Staging No matching staging information was found for the patient.  Lloyd Huger, MD   05/09/2016 8:47 AM

## 2016-05-07 ENCOUNTER — Inpatient Hospital Stay: Payer: Medicare Other | Attending: Oncology | Admitting: Oncology

## 2016-05-07 VITALS — BP 144/84 | HR 67 | Temp 97.5°F | Wt 202.8 lb

## 2016-05-07 DIAGNOSIS — Z87891 Personal history of nicotine dependence: Secondary | ICD-10-CM | POA: Insufficient documentation

## 2016-05-07 DIAGNOSIS — R918 Other nonspecific abnormal finding of lung field: Secondary | ICD-10-CM

## 2016-05-07 DIAGNOSIS — Z880 Allergy status to penicillin: Secondary | ICD-10-CM | POA: Insufficient documentation

## 2016-05-07 DIAGNOSIS — F0390 Unspecified dementia without behavioral disturbance: Secondary | ICD-10-CM | POA: Insufficient documentation

## 2016-05-07 DIAGNOSIS — E119 Type 2 diabetes mellitus without complications: Secondary | ICD-10-CM | POA: Diagnosis not present

## 2016-05-07 DIAGNOSIS — Z7984 Long term (current) use of oral hypoglycemic drugs: Secondary | ICD-10-CM | POA: Diagnosis not present

## 2016-05-07 DIAGNOSIS — Z79899 Other long term (current) drug therapy: Secondary | ICD-10-CM | POA: Diagnosis not present

## 2016-05-07 NOTE — Progress Notes (Signed)
Patient denies pain or discomfort at this time.

## 2016-05-28 ENCOUNTER — Encounter: Payer: Medicare Other | Admitting: Family Medicine

## 2016-05-28 NOTE — Progress Notes (Deleted)
Subjective:  HPI  Diabetes Mellitus Type II, Follow-up:   Lab Results  Component Value Date   HGBA1C 9.3 (H) 02/06/2016   HGBA1C 9.2 08/15/2015   HGBA1C 8.6 04/18/2015    Last seen for diabetes {1-12:18279} {days/wks/mos/yrs:310907} ago.  Management since then includes ***. He reports {excellent/good/fair/poor:19665} compliance with treatment. He {ACTION; IS/IS UVO:53664403} having side effects. *** Current symptoms include {Symptoms; diabetes:14075} and have been {Desc; course:15616}. Home blood sugar records: {diabetes glucometry results:16657}  Episodes of hypoglycemia? {yes***/no:17258}   Current Insulin Regimen: *** Most Recent Eye Exam: *** Weight trend: {trend:16658} Prior visit with dietician: {yes/no:17258} Current diet: {diet habits:16563} Current exercise: {exercise types:16438}  Pertinent Labs:    Component Value Date/Time   CHOL 165 08/15/2015 0951   TRIG 283 (H) 08/15/2015 0951   HDL 34 (L) 08/15/2015 0951   LDLCALC 74 08/15/2015 0951   CREATININE 1.30 (H) 05/03/2016 0933    Wt Readings from Last 3 Encounters:  05/07/16 202 lb 13.2 oz (92 kg)  02/16/16 201 lb (91.2 kg)  02/07/16 200 lb 4.6 oz (90.9 kg)    ------------------------------------------------------------------------    Prior to Admission medications   Medication Sig Start Date End Date Taking? Authorizing Provider  metFORMIN (GLUCOPHAGE) 1000 MG tablet Take 1 tablet (1,000 mg total) by mouth daily after supper. 08/15/15  Yes Richard Maceo Pro., MD    Patient Active Problem List   Diagnosis Date Noted  . Mass of upper lobe of right lung 02/05/2016  . Sepsis ([place]) 01/28/2016  . Adenomatous colon polyp 12/09/2014  . Alzheimer's dementia 12/09/2014  . 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 Diabetes ([place]) 12/09/2014  . Glaucoma 12/09/2014  . Adult hypothyroidism 12/09/2014  . Adiposity 12/09/2014  . Arthritis, degenerative 12/09/2014  . Hypercholesterolemia without  hypertriglyceridemia 12/09/2014  . Avitaminosis D 12/09/2014    Past Medical History:  Diagnosis Date  . Dementia   . Diabetes mellitus without complication ([place])   . Dyspnea   . Pneumonia   . Psoriasis   . Tuberculosis    per wife as a child    Social History   Social History  . Marital status: Married    Spouse name: N/A  . Number of children: N/A  . Years of education: N/A   Occupational History  . Not on file.   Social History Main Topics  . Smoking status: Former Smoker    Quit date: 02/20/1976  . Smokeless tobacco: Never Used  . Alcohol use No  . Drug use: No  . Sexual activity: Not on file   Other Topics Concern  . Not on file   Social History Narrative  . No narrative on file    Allergies  Allergen Reactions  . Ciprofloxacin Anaphylaxis  . Hydrocodone-Acetaminophen Nausea And Vomiting  . Penicillins     rash  . Sulfa Antibiotics     Review of Systems  Constitutional: Negative.   HENT: Negative.   Eyes: Negative.   Respiratory: Negative.   Cardiovascular: Negative.   Gastrointestinal: Negative.   Genitourinary: Negative.   Musculoskeletal: Negative.   Skin: Negative.   Neurological: Negative.   Endo/Heme/Allergies: Negative.   Psychiatric/Behavioral: Positive for memory loss.    Immunization History  Administered Date(s) Administered  . Influenza, High Dose Seasonal PF 12/13/2014, 12/16/2015  . Pneumococcal Conjugate-13 03/18/2014  . Pneumococcal Polysaccharide-23 01/14/2013  . Td 11/02/2002    Objective:  There were no vitals taken for this visit.  Physical Exam  Lab Results  Component  Value Date   WBC 8.4 02/20/2016   HGB 13.7 02/20/2016   HCT 39.3 (L) 02/20/2016   PLT 213 02/20/2016   GLUCOSE 235 (H) 02/06/2016   CHOL 165 08/15/2015   TRIG 283 (H) 08/15/2015   HDL 34 (L) 08/15/2015   LDLCALC 74 08/15/2015   TSH 2.770 08/15/2015   INR 1.08 02/20/2016   HGBA1C 9.3 (H) 02/06/2016    CMP     Component Value Date/Time    NA 137 02/06/2016 1515   K 4.9 02/06/2016 1515   CL 96 02/06/2016 1515   CO2 24 02/06/2016 1515   GLUCOSE 235 (H) 02/06/2016 1515   GLUCOSE 205 (H) 01/30/2016 0448   BUN 22 02/06/2016 1515   CREATININE 1.30 (H) 05/03/2016 0933   CALCIUM 9.2 02/06/2016 1515   PROT 7.1 01/28/2016 1655   PROT 6.6 08/15/2015 0951   ALBUMIN 3.7 02/06/2016 1515   AST 31 01/28/2016 1655   ALT 32 01/28/2016 1655   ALKPHOS 61 01/28/2016 1655   BILITOT 0.6 01/28/2016 1655   BILITOT 0.4 08/15/2015 0951   GFRNONAA 54 (L) 02/06/2016 1515   GFRAA 62 02/06/2016 1515    Assessment and Plan :    Miguel Aschoff MD [facility] 05/28/2016 9:31 AM

## 2016-06-01 ENCOUNTER — Other Ambulatory Visit: Payer: Self-pay | Admitting: Family Medicine

## 2016-06-04 ENCOUNTER — Ambulatory Visit (INDEPENDENT_AMBULATORY_CARE_PROVIDER_SITE_OTHER): Payer: Medicare Other | Admitting: Family Medicine

## 2016-06-04 VITALS — BP 132/68 | HR 74 | Temp 98.0°F | Resp 16 | Wt 204.0 lb

## 2016-06-04 DIAGNOSIS — E119 Type 2 diabetes mellitus without complications: Secondary | ICD-10-CM

## 2016-06-04 DIAGNOSIS — E039 Hypothyroidism, unspecified: Secondary | ICD-10-CM | POA: Diagnosis not present

## 2016-06-04 DIAGNOSIS — E7800 Pure hypercholesterolemia, unspecified: Secondary | ICD-10-CM

## 2016-06-04 DIAGNOSIS — G308 Other Alzheimer's disease: Secondary | ICD-10-CM

## 2016-06-04 DIAGNOSIS — F0280 Dementia in other diseases classified elsewhere without behavioral disturbance: Secondary | ICD-10-CM

## 2016-06-04 LAB — POCT UA - MICROALBUMIN: Microalbumin Ur, POC: 100 mg/L

## 2016-06-04 NOTE — Progress Notes (Signed)
Joel Grant  MRN: [id number] DOB: 1927-03-30  Subjective:  HPI   The patient is an 83 year old male who presents for follow up of his diabetes, dementia and labs.  He ws last seen on 02/16/16. The patient had his last A1C on 02/16/16 and it was not at goal with a reading of 9.3.  The patient checks his glucose at home and gets readings that range about 130's-170's.  He is up to date on his foot exam but does need to have his urine checked today.  The patient is also here for early stage Alzheimer disease.  His last 6CIT was done in November 2017 and he scored 25.  He is currently not on any medication for this diagnosis.  6CIT Screen 06/04/2016 02/16/2016  What Year? 4 points 4 points  What month? 3 points 0 points  What time? 0 points 3 points  Count back from 20 0 points 4 points  Months in reverse 4 points 4 points  Repeat phrase 10 points 10 points  Total Score 21 25   The patient also had elevated blood pressure on his last visit.  His last BP reading was 144/84.    It is also time for patient to have labs checked.  Patient Active Problem List   Diagnosis Date Noted  . Mass of upper lobe of right lung 02/05/2016  . Sepsis ([place]) 01/28/2016  . Adenomatous colon polyp 12/09/2014  . Alzheimer's dementia 12/09/2014  . 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 Diabetes ([place]) 12/09/2014  . Glaucoma 12/09/2014  . Adult hypothyroidism 12/09/2014  . Adiposity 12/09/2014  . Arthritis, degenerative 12/09/2014  . Hypercholesterolemia without hypertriglyceridemia 12/09/2014  . Avitaminosis D 12/09/2014    Past Medical History:  Diagnosis Date  . Dementia   . Diabetes mellitus without complication ([place])   . Dyspnea   . Pneumonia   . Psoriasis   . Tuberculosis    per wife as a child    Social History   Social History  . Marital status: Married    Spouse name: N/A  . Number of children: N/A  . Years of education: N/A   Occupational History  . Not on file.   Social  History Main Topics  . Smoking status: Former Smoker    Quit date: 02/20/1976  . Smokeless tobacco: Never Used  . Alcohol use No  . Drug use: No  . Sexual activity: Not on file   Other Topics Concern  . Not on file   Social History Narrative  . No narrative on file    Outpatient Encounter Prescriptions as of 06/04/2016  Medication Sig  . [employer] LANCETS ULTRA-THIN 30G MISC USE 1 LANCET TO CHECK SUGAR TWICE A DAY  . FREESTYLE LITE test strip CHECK SUGAR ONCE DAILY. DX: E11.9  . metFORMIN (GLUCOPHAGE) 1000 MG tablet Take 1 tablet (1,000 mg total) by mouth daily after supper.   No facility-administered encounter medications on file as of 06/04/2016.     Allergies  Allergen Reactions  . Ciprofloxacin Anaphylaxis  . Hydrocodone-Acetaminophen Nausea And Vomiting  . Penicillins     rash  . Sulfa Antibiotics     Review of Systems  Constitutional: Negative for chills, fever and malaise/fatigue.  HENT: Negative.   Eyes: Negative.   Respiratory: Negative for cough, shortness of breath and wheezing.   Cardiovascular: Negative for chest pain, palpitations, orthopnea and leg swelling.  Gastrointestinal: Negative.   Genitourinary: Negative for dysuria.  Skin: Negative.  Neurological: Negative for weakness.       Continuing memory issues.  Endo/Heme/Allergies: Negative.  Negative for polydipsia.    Objective:  BP 132/68 (BP Location: Right Arm, Patient Position: Sitting, Cuff Size: Normal)   Pulse 74   Temp 98 F (36.7 C) (Oral)   Resp 16   Wt 204 lb (92.5 kg)   BMI 31.95 kg/m   Physical Exam  Constitutional: He is oriented to person, place, and time and well-developed, well-nourished, and in no distress.  HENT:  Head: Normocephalic and atraumatic.  Eyes: Conjunctivae are normal. No scleral icterus.  Neck: No thyromegaly present.  Cardiovascular: Normal rate, regular rhythm and normal heart sounds.   Pulmonary/Chest: Effort normal and breath sounds normal.  Abdominal: Soft.    Musculoskeletal: He exhibits edema.  Trace edema.  Neurological: He is alert and oriented to person, place, and time.  Skin: Skin is warm and dry.  Psychiatric: Mood, memory, affect and judgment normal.    Assessment and Plan :  MCI/Early Alzheimers Start Donepezil 5 mg daily. MMSE next visit. More than 50% of visit spent in counselling. TIIDM Poor control. Pt unable to manage disease.

## 2016-06-05 DIAGNOSIS — G308 Other Alzheimer's disease: Secondary | ICD-10-CM | POA: Diagnosis not present

## 2016-06-05 DIAGNOSIS — F0280 Dementia in other diseases classified elsewhere without behavioral disturbance: Secondary | ICD-10-CM | POA: Diagnosis not present

## 2016-06-05 DIAGNOSIS — E119 Type 2 diabetes mellitus without complications: Secondary | ICD-10-CM | POA: Diagnosis not present

## 2016-06-05 DIAGNOSIS — E039 Hypothyroidism, unspecified: Secondary | ICD-10-CM | POA: Diagnosis not present

## 2016-06-05 DIAGNOSIS — E7800 Pure hypercholesterolemia, unspecified: Secondary | ICD-10-CM | POA: Diagnosis not present

## 2016-06-06 ENCOUNTER — Telehealth: Payer: Self-pay | Admitting: Emergency Medicine

## 2016-06-06 LAB — LIPID PANEL WITH LDL/HDL RATIO
CHOLESTEROL TOTAL: 168 mg/dL (ref 100–199)
HDL: 34 mg/dL — AB (ref >39)
LDL Calculated: 91 mg/dL (ref 0–99)
LDL/HDL RATIO: 2.7 ratio (ref 0.0–3.6)
TRIGLYCERIDES: 217 mg/dL — AB (ref 0–149)
VLDL Cholesterol Cal: 43 mg/dL — ABNORMAL HIGH (ref 5–40)

## 2016-06-06 LAB — COMPREHENSIVE METABOLIC PANEL
ALBUMIN: 4.1 g/dL (ref 3.5–4.7)
ALK PHOS: 49 IU/L (ref 39–117)
ALT: 27 IU/L (ref 0–44)
AST: 20 IU/L (ref 0–40)
Albumin/Globulin Ratio: 2.1 (ref 1.2–2.2)
BILIRUBIN TOTAL: 0.4 mg/dL (ref 0.0–1.2)
BUN / CREAT RATIO: 12 (ref 10–24)
BUN: 15 mg/dL (ref 8–27)
CHLORIDE: 96 mmol/L (ref 96–106)
CO2: 25 mmol/L (ref 18–29)
CREATININE: 1.26 mg/dL (ref 0.76–1.27)
Calcium: 9.1 mg/dL (ref 8.6–10.2)
GFR calc Af Amer: 59 mL/min/{1.73_m2} — ABNORMAL LOW (ref >59)
GFR calc non Af Amer: 51 mL/min/{1.73_m2} — ABNORMAL LOW (ref >59)
GLOBULIN, TOTAL: 2 g/dL (ref 1.5–4.5)
GLUCOSE: 156 mg/dL — AB (ref 65–99)
Potassium: 4.8 mmol/L (ref 3.5–5.2)
SODIUM: 135 mmol/L (ref 134–144)
Total Protein: 6.1 g/dL (ref 6.0–8.5)

## 2016-06-06 LAB — CBC WITH DIFFERENTIAL/PLATELET
BASOS ABS: 0 10*3/uL (ref 0.0–0.2)
Basos: 0 %
EOS (ABSOLUTE): 0.2 10*3/uL (ref 0.0–0.4)
EOS: 3 %
Hematocrit: 40 % (ref 37.5–51.0)
Hemoglobin: 13.7 g/dL (ref 13.0–17.7)
IMMATURE GRANULOCYTES: 0 %
Immature Grans (Abs): 0 10*3/uL (ref 0.0–0.1)
LYMPHS ABS: 2.3 10*3/uL (ref 0.7–3.1)
Lymphs: 31 %
MCH: 29.8 pg (ref 26.6–33.0)
MCHC: 34.3 g/dL (ref 31.5–35.7)
MCV: 87 fL (ref 79–97)
MONOS ABS: 0.5 10*3/uL (ref 0.1–0.9)
Monocytes: 7 %
NEUTROS PCT: 59 %
Neutrophils Absolute: 4.5 10*3/uL (ref 1.4–7.0)
PLATELETS: 237 10*3/uL (ref 150–379)
RBC: 4.6 x10E6/uL (ref 4.14–5.80)
RDW: 14.2 % (ref 12.3–15.4)
WBC: 7.6 10*3/uL (ref 3.4–10.8)

## 2016-06-06 LAB — TSH: TSH: 1.82 u[IU]/mL (ref 0.450–4.500)

## 2016-06-06 LAB — HEMOGLOBIN A1C
Est. average glucose Bld gHb Est-mCnc: 206 mg/dL
HEMOGLOBIN A1C: 8.8 % — AB (ref 4.8–5.6)

## 2016-06-06 NOTE — Telephone Encounter (Signed)
-----   Message from Jerrol Banana., MD sent at 06/06/2016  8:18 AM EST ----- Labs all stable--diabetes about the same. Pt will not address due to dementia.

## 2016-06-06 NOTE — Telephone Encounter (Signed)
LMTCB

## 2016-06-06 NOTE — Telephone Encounter (Signed)
Wife Rhae Lerner

## 2016-08-06 ENCOUNTER — Ambulatory Visit
Admission: RE | Admit: 2016-08-06 | Discharge: 2016-08-06 | Disposition: A | Discharge status: Home / Self Care | Payer: Medicare Other | Source: Ambulatory Visit | Attending: Oncology | Admitting: Oncology

## 2016-08-06 DIAGNOSIS — R918 Other nonspecific abnormal finding of lung field: Secondary | ICD-10-CM | POA: Diagnosis not present

## 2016-08-06 DIAGNOSIS — J479 Bronchiectasis, uncomplicated: Secondary | ICD-10-CM | POA: Diagnosis not present

## 2016-08-06 DIAGNOSIS — K760 Fatty (change of) liver, not elsewhere classified: Secondary | ICD-10-CM | POA: Insufficient documentation

## 2016-08-06 DIAGNOSIS — I2510 Atherosclerotic heart disease of native coronary artery without angina pectoris: Secondary | ICD-10-CM | POA: Diagnosis not present

## 2016-08-06 LAB — POCT I-STAT CREATININE: Creatinine, Ser: 1.2 mg/dL (ref 0.61–1.24)

## 2016-08-06 MED ORDER — IOPAMIDOL (ISOVUE-300) INJECTION 61%
75.0000 mL | Freq: Once | INTRAVENOUS | Status: AC | PRN
  Administered 2016-08-06: 75 mL via INTRAVENOUS

## 2016-08-07 NOTE — Progress Notes (Signed)
[facility]  [phone] [phone]  ID: Joel Grant OB: 02-08-1927  MR#: [id number]  SWF#:[id number]  Patient Care Team: Jerrol Banana., MD as PCP - General (Family Medicine)  CHIEF COMPLAINT: Right upper lobe lung mass.  INTERVAL HISTORY: Patient returns to clinic today for further evaluation and discussion of his imaging results. Previous he, he was referred for biopsy, but patient ultimately refused. Given his underlying dementia, the entire history is given by his wife and son. Currently, patient feels well and is at his baseline. He has no neurologic complaints. He denies any recent fevers. He has a good appetite and denies weight loss. He denies any chest pain, shortness of breath, cough, or hemoptysis.  He has no nausea, vomiting, constipation, or diarrhea. He has no urinary complaints. Patient offers no specific complaints today.   REVIEW OF SYSTEMS:   Review of Systems  Constitutional: Negative.  Negative for fever, malaise/fatigue and weight loss.  Respiratory: Negative.  Negative for cough, hemoptysis, sputum production and shortness of breath.   Cardiovascular: Negative.  Negative for chest pain and leg swelling.  Gastrointestinal: Negative.  Negative for abdominal pain.  Genitourinary: Negative.   Musculoskeletal: Negative.   Neurological: Negative.  Negative for weakness.  Psychiatric/Behavioral: Positive for memory loss. The patient is not nervous/anxious.     As per HPI. Otherwise, a complete review of systems is negative.  PAST MEDICAL HISTORY: Past Medical History:  Diagnosis Date  . Dementia   . Diabetes mellitus without complication ([place])   . Dyspnea   . Pneumonia   . Psoriasis   . Tuberculosis    per wife as a child    PAST SURGICAL HISTORY: Past Surgical History:  Procedure Laterality Date  . CATARACT EXTRACTION      FAMILY HISTORY: Family History  Problem Relation Age of Onset  . Heart disease  Mother   . Dementia Mother   . Diabetes Father   . Heart disease Father   . Peripheral vascular disease Brother   . Diabetes Brother     ADVANCED DIRECTIVES (Y/N):  N  HEALTH MAINTENANCE: Social History  Substance Use Topics  . Smoking status: Former Smoker    Quit date: 02/20/1976  . Smokeless tobacco: Never Used  . Alcohol use No     Colonoscopy:  PAP:  Bone density:  Lipid panel:  Allergies  Allergen Reactions  . Ciprofloxacin Anaphylaxis  . Hydrocodone-Acetaminophen Nausea And Vomiting  . Penicillins     rash  . Sulfa Antibiotics     Current Outpatient Prescriptions  Medication Sig Dispense Refill  . [employer]S LANCETS ULTRA-THIN 30G MISC USE 1 LANCET TO CHECK SUGAR TWICE A DAY 200 each 12  . FREESTYLE LITE test strip CHECK SUGAR ONCE DAILY. DX: E11.9 200 each 12  . metFORMIN (GLUCOPHAGE) 1000 MG tablet Take 1 tablet (1,000 mg total) by mouth daily after supper. 90 tablet 3   No current facility-administered medications for this visit.     OBJECTIVE: Vitals:   08/08/16 1106  BP: 129/84  Pulse: 67  Resp: 20  Temp: 97.4 F (36.3 C)     Body mass index is 31.07 kg/m.    ECOG FS:1 - Symptomatic but completely ambulatory  General: Well-developed, well-nourished, no acute distress. Eyes: Pink conjunctiva, anicteric sclera. Lungs: Clear to auscultation bilaterally. Heart: Regular rate and rhythm. No rubs, murmurs, or gallops. Abdomen: Soft, nontender, nondistended. No organomegaly noted, normoactive bowel sounds. Musculoskeletal: No edema, cyanosis, or clubbing. Neuro: Alert. Cranial  nerves grossly intact. Skin: No rashes or petechiae noted. Psych: Normal affect.    LAB RESULTS:  Lab Results  Component Value Date   NA 135 06/05/2016   K 4.8 06/05/2016   CL 96 06/05/2016   CO2 25 06/05/2016   GLUCOSE 156 (H) 06/05/2016   BUN 15 06/05/2016   CREATININE 1.20 08/06/2016   CALCIUM 9.1 06/05/2016   PROT 6.1 06/05/2016   ALBUMIN 4.1 06/05/2016   AST 20  06/05/2016   ALT 27 06/05/2016   ALKPHOS 49 06/05/2016   BILITOT 0.4 06/05/2016   GFRNONAA 51 (L) 06/05/2016   GFRAA 59 (L) 06/05/2016    Lab Results  Component Value Date   WBC 7.6 06/05/2016   NEUTROABS 4.5 06/05/2016   HGB 13.7 02/20/2016   HCT 40.0 06/05/2016   MCV 87 06/05/2016   PLT 237 06/05/2016     STUDIES: Ct Chest W Contrast  Result Date: 08/06/2016 CLINICAL DATA:  Lung mass EXAM: CT CHEST WITH CONTRAST TECHNIQUE: Multidetector CT imaging of the chest was performed during intravenous contrast administration. CONTRAST:  57m ISOVUE-300 IOPAMIDOL (ISOVUE-300) INJECTION 61% COMPARISON:  May 03, 2016 and March 29, 2016 FINDINGS: Cardiovascular: There is no thoracic aortic aneurysm or dissection. The visualized great vessels appear unremarkable except for slight calcification in the right common and left subclavian arteries. There is calcification at multiple sites in the thoracic aorta. There are multiple foci of coronary artery calcification. Pericardium is not appreciably thickened. Mediastinum/Nodes: Multinodular goiter is again noted with multiple masses bilaterally. There is a lymph node in the right paratracheal region measuring 1.3 x 1.1 cm. There is a lymph node just to the right of the upper carina measuring 1.2 x 1.0 cm. Scattered subcentimeter mediastinal lymph nodes are also apparent. Lungs/Pleura: The previously noted mass in the anterior segment of the right upper lobe measures 4.0 x 3.4 x 4.1 cm. Its contour remains irregular. This mass once again is seen to approach the pleural surface on the right without appreciable pleural thickening in this area. There are areas of nearby scarring. On axial slice 45 series 3, there is a 3 mm nodular opacity in the anterior segment of the left upper lobe, stable. On axial slice 53 series 3, there is a nodular lesion in the medial aspect of the anterior segment right upper lobe measuring 1.1 x 0.6 cm. On axial slice 74 series 3,  there is a 5 x 4 mm nodular opacity in the posterior segment of the left upper lobe, stable. On axial slice 79 series 3, there is a 3 mm nodular opacity in the lateral segment of the right middle lobe. There is a more central 8 mm nodular opacity on this same slice in the lateral segment of the right middle lobe. There are no appreciable new nodular lesions. There is scarring lateral left base, stable. There is slight lower lobe bronchiectatic change bilaterally. No pleural effusion or pleural thickening evident. Upper Abdomen: There is hepatic steatosis. Visualized upper abdominal structures otherwise appear unremarkable. In particular, no adrenal lesions are demonstrable. Musculoskeletal: There is degenerative change in the thoracic spine. No blastic or lytic bone lesions. IMPRESSION: The dominant mass in the right upper lobe appears stable in size and contour. Multiple smaller nodular lesions are also stable. The contour and overall appearance of the dominant mass in the right upper lobe raises concern for possible slow growing neoplasm. Correlation with nuclear medicine PET study may be advisable in this regard. Mild lower lobe bronchiectatic change. No consolidation  or pleural effusions. Borderline prominent paratracheal lymph nodes. No new lymph node prominence. Areas of atherosclerotic calcification including foci of coronary artery calcification. Hepatic steatosis. Electronically Signed   By: Lowella Grip III M.D.   On: 08/06/2016 10:50    ASSESSMENT: Right upper lobe lung mass.  PLAN:    1. Right upper lobe lung mass: Highly suspicious for underlying malignancy. CT results reviewed independently and reported as above with lung mass stable in size and contour. Given patient's advanced age and underlying dementia, treatment would be difficult. Patient was initially referred to biopsy, but ultimately refused and was unable to lie flat for the procedure. Hospice and palliative care were discussed,  but patient and patient's family are not ready for this at this point. They wish to continue simple observation therefore will repeat CT scan in 4 months and patient will follow-up 1-2 days later.  2. Acute renal failure: Resolved. 3. Dementia: Patient appears to be at his baseline.  Approximately 30 minutes was spent in discussion of which greater than 50% was consultation.  Patient expressed understanding and was in agreement with this plan. He also understands that He can call clinic at any time with any questions, concerns, or complaints.   Cancer Staging No matching staging information was found for the patient.  Lloyd Huger, MD   08/10/2016 1:03 PM

## 2016-08-08 ENCOUNTER — Inpatient Hospital Stay: Payer: Medicare Other | Attending: Oncology | Admitting: Oncology

## 2016-08-08 VITALS — BP 129/84 | HR 67 | Temp 97.4°F | Resp 20 | Wt 198.4 lb

## 2016-08-08 DIAGNOSIS — Z87891 Personal history of nicotine dependence: Secondary | ICD-10-CM | POA: Diagnosis not present

## 2016-08-08 DIAGNOSIS — F0390 Unspecified dementia without behavioral disturbance: Secondary | ICD-10-CM | POA: Insufficient documentation

## 2016-08-08 DIAGNOSIS — I2510 Atherosclerotic heart disease of native coronary artery without angina pectoris: Secondary | ICD-10-CM

## 2016-08-08 DIAGNOSIS — Z7984 Long term (current) use of oral hypoglycemic drugs: Secondary | ICD-10-CM | POA: Diagnosis not present

## 2016-08-08 DIAGNOSIS — Z79899 Other long term (current) drug therapy: Secondary | ICD-10-CM

## 2016-08-08 DIAGNOSIS — K760 Fatty (change of) liver, not elsewhere classified: Secondary | ICD-10-CM | POA: Insufficient documentation

## 2016-08-08 DIAGNOSIS — E119 Type 2 diabetes mellitus without complications: Secondary | ICD-10-CM

## 2016-08-08 DIAGNOSIS — R918 Other nonspecific abnormal finding of lung field: Secondary | ICD-10-CM | POA: Diagnosis not present

## 2016-08-16 ENCOUNTER — Other Ambulatory Visit: Payer: Self-pay | Admitting: Family Medicine

## 2016-08-16 DIAGNOSIS — E119 Type 2 diabetes mellitus without complications: Secondary | ICD-10-CM

## 2016-09-17 ENCOUNTER — Ambulatory Visit (INDEPENDENT_AMBULATORY_CARE_PROVIDER_SITE_OTHER): Payer: Medicare Other | Admitting: Family Medicine

## 2016-09-17 ENCOUNTER — Encounter: Payer: Self-pay | Admitting: Family Medicine

## 2016-09-17 VITALS — BP 124/66 | HR 80 | Temp 98.4°F | Resp 20 | Wt 203.0 lb

## 2016-09-17 DIAGNOSIS — G308 Other Alzheimer's disease: Secondary | ICD-10-CM | POA: Diagnosis not present

## 2016-09-17 DIAGNOSIS — F0280 Dementia in other diseases classified elsewhere without behavioral disturbance: Secondary | ICD-10-CM

## 2016-09-17 DIAGNOSIS — E119 Type 2 diabetes mellitus without complications: Secondary | ICD-10-CM | POA: Diagnosis not present

## 2016-09-17 LAB — POCT GLYCOSYLATED HEMOGLOBIN (HGB A1C)
ESTIMATED AVERAGE GLUCOSE: 203
HEMOGLOBIN A1C: 8.7

## 2016-09-17 MED ORDER — DONEPEZIL HCL 5 MG PO TABS: 5.0000 mg | ORAL_TABLET | Freq: Every day | ORAL | 3 refills | Status: DC

## 2016-09-17 NOTE — Progress Notes (Signed)
Patient: Joel Grant Male    DOB: 15-Jan-1927   83 y.o.   MRN: [id number] Visit Date: 09/17/2016  Today's Provider: Wilhemena Durie, MD   Chief Complaint  Patient presents with  . Memory Loss  . Diabetes   Subjective:    HPI     Follow up for MCI/ Early Alzheimer's  The patient was last seen for this 3 months ago. Changes made at last visit include adding Aricept 5 mg.  This medication was not sent to pharmacy at [facility]. MMSE - Mini Mental State Exam 09/17/2016  Orientation to time 1  Orientation to Place 2  Registration 1  Attention/ Calculation 0  Recall 0  Language- name 2 objects 1  Language- repeat 0  Language- follow 3 step command 3  Language- read & follow direction 1  Write a sentence 0  Copy design 1  Total score 10    ------------------------------------------------------------------------------------ Diabetes Last A1C was 8.8% on 06/04/2016. He is unable to manage this disease per last OV note. His fasting sugars at home are 134-195. He is taking Metformin 1000 mg after supper.   Allergies  Allergen Reactions  . Ciprofloxacin Anaphylaxis  . Hydrocodone-Acetaminophen Nausea And Vomiting  . Penicillins     rash  . Sulfa Antibiotics      Current Outpatient Prescriptions:  .  [employer] LANCETS ULTRA-THIN 30G MISC, USE 1 LANCET TO CHECK SUGAR TWICE A DAY, Disp: 200 each, Rfl: 12 .  FREESTYLE LITE test strip, CHECK SUGAR ONCE DAILY. DX: E11.9, Disp: 200 each, Rfl: 12 .  metFORMIN (GLUCOPHAGE) 1000 MG tablet, TAKE 1 TABLET (1,000 MG TOTAL) BY MOUTH DAILY AFTER SUPPER., Disp: 90 tablet, Rfl: 3  Review of Systems  Constitutional: Negative for activity change, appetite change, chills, diaphoresis, fatigue, fever and unexpected weight change.  Eyes: Negative for visual disturbance.  Respiratory: Negative for shortness of breath.   Cardiovascular: Negative for chest pain, palpitations and leg swelling.  Endocrine: Negative for polydipsia, polyphagia and  polyuria.  Musculoskeletal: Positive for back pain.  Psychiatric/Behavioral: Positive for confusion.    Social History  Substance Use Topics  . Smoking status: Former Smoker    Quit date: 02/20/1976  . Smokeless tobacco: Never Used  . Alcohol use No   Objective:   BP 124/66 (BP Location: Right Arm, Patient Position: Sitting, Cuff Size: Large)   Pulse 80   Temp 98.4 F (36.9 C) (Oral)   Resp 20   Wt 203 lb (92.1 kg)   BMI 31.79 kg/m  Vitals:   09/17/16 0958  BP: 124/66  Pulse: 80  Resp: 20  Temp: 98.4 F (36.9 C)  TempSrc: Oral  Weight: 203 lb (92.1 kg)     Physical Exam  Constitutional: He appears well-developed and well-nourished.  HENT:  Head: Normocephalic and atraumatic.  Right Ear: External ear normal.  Left Ear: External ear normal.  Nose: Nose normal.  Eyes: Conjunctivae are normal.  Neck: Normal range of motion. No tracheal deviation present. No thyromegaly present.  Cardiovascular: Normal rate, regular rhythm and normal heart sounds.   Pulmonary/Chest: Effort normal and breath sounds normal. No respiratory distress.  Abdominal: Soft.  Musculoskeletal: He exhibits edema (trace).  Lymphadenopathy:    He has no cervical adenopathy.  Skin: Skin is warm and dry.  Psychiatric: He has a normal mood and affect. His behavior is normal.        Assessment & Plan:     1. Alzheimer's disease of other  onset without behavioral disturbance MMSE is 10 today. Donepezil has been sent to the pharmacy. - donepezil (ARICEPT) 5 MG tablet; Take 1 tablet (5 mg total) by mouth at bedtime.  Dispense: 90 tablet; Refill: 3   2. Type 2 diabetes mellitus without complication, without long-term current use of insulin (HCC) Stable from last check. If A1C gets over 9%, will consider adding Actos. Continue current medications. - POCT glycosylated hemoglobin (Hb A1C)  Results for orders placed or performed in visit on 09/17/16  POCT glycosylated hemoglobin (Hb A1C)  Result  Value Ref Range   Hemoglobin A1C 8.7    Est. average glucose Bld gHb Est-mCnc 203   3.Lung Mass      I have done the exam and reviewed the above chart and it is accurate to the best of my knowledge. Development worker, community has been used in this note in any air is in the dictation or transcription are unintentional.  Wilhemena Durie, MD  [place]

## 2016-10-13 ENCOUNTER — Encounter: Payer: Self-pay | Admitting: Physician Assistant

## 2016-10-13 ENCOUNTER — Ambulatory Visit (INDEPENDENT_AMBULATORY_CARE_PROVIDER_SITE_OTHER): Payer: Medicare Other | Admitting: Physician Assistant

## 2016-10-13 VITALS — BP 128/60 | HR 86 | Temp 98.0°F | Resp 16 | Wt 202.0 lb

## 2016-10-13 DIAGNOSIS — K146 Glossodynia: Secondary | ICD-10-CM

## 2016-10-13 MED ORDER — MAGIC MOUTHWASH W/LIDOCAINE: 5.0000 mL | Freq: Three times a day (TID) | ORAL | 0 refills | Status: DC | PRN

## 2016-10-13 NOTE — Patient Instructions (Signed)
Canker Sores Canker sores are small, painful sores that develop inside your mouth. They may also be called aphthous ulcers. You can get canker sores on the inside of your lips or cheeks, on your tongue, or anywhere inside your mouth. You can have just one canker sore or several of them. Canker sores cannot be passed from one person to another (noncontagious). These sores are different than the sores that you may get on the outside of your lips (cold sores or fever blisters). Canker sores usually start as painful red bumps. Then they turn into small white, yellow, or gray ulcers that have red borders. The ulcers may be quite painful. The pain may be worse when you eat or drink. What are the causes? The cause of this condition is not known. What increases the risk? This condition is more likely to develop in:  Women.  People in their teens or 80s.  Women who are having their menstrual period.  People who are under a lot of emotional stress.  People who do not get enough iron or B vitamins.  People who have poor oral hygiene.  People who have an injury inside the mouth. This can happen after having dental work or from chewing something hard.  What are the signs or symptoms? Along with the canker sore, symptoms may also include:  Fever.  Fatigue.  Swollen lymph nodes in your neck.  How is this diagnosed? This condition can be diagnosed based on your symptoms. Your health care provider will also examine your mouth. Your health care provider may also do tests if you get canker sores often or if they are very bad. Tests may include:  Blood tests to rule out other causes of canker sores.  Taking swabs from the sore to check for infection.  Taking a small piece of skin from the sore (biopsy) to test it for cancer.  How is this treated? Most canker sores clear up without treatment in about 10 days. Home care is usually the only treatment that you will need. Over-the-counter medicines  can relieve discomfort.If you have severe canker sores, your health care provider may prescribe:  Numbing ointment to relieve pain.  Vitamins.  Steroid medicines. These may be given as: ? Oral pills. ? Mouth rinses. ? Gels.  Antibiotic mouth rinse.  Follow these instructions at home:  Apply, take, or use medicines only as directed by your health care provider. These include vitamins.  If you were prescribed an antibiotic mouth rinse, finish all of it even if you start to feel better.  Until the sores are healed: ? Do not drink coffee or citrus juices. ? Do not eat spicy or salty foods.  Use a mild, over-the-counter mouth rinse as directed by your health care provider.  Practice good oral hygiene. ? Floss your teeth every day. ? Brush your teeth with a soft brush twice each day. Contact a health care provider if:  Your symptoms do not get better after two weeks.  You also have a fever or swollen glands.  You get canker sores often.  You have a canker sore that is getting larger.  You cannot eat or drink due to your canker sores. This information is not intended to replace advice given to you by your health care provider. Make sure you discuss any questions you have with your health care provider. Document Released: 07/14/2010 Document Revised: 08/25/2015 Document Reviewed: 02/17/2014 Elsevier Interactive Patient Education  [employer].

## 2016-10-13 NOTE — Progress Notes (Signed)
Patient: Joel Grant Male    DOB: [birth year]   83 y.o.   MRN: [id number] Visit Date: 10/13/2016  Today's Provider: Trinna Post, PA-C   Chief Complaint  Patient presents with  . Oral Pain   Subjective:    HPI  Virgal Warmuth is an 83 y/o man with dementia on Aricept who presents here today for mouth pain.  He is here with his wife today who provides the history. Wife reports that he started complaining of his mouth hurting bad yesterday. The area is located on his tongue on his left side of his tongue. He says it hurts to eat, he has to eat on the opposite side of his mouth. He reports that he is not in any pain right now but his wife has been putting oral gel on his tongue. Wife says that Dr. Rosanna Randy treated him with Dukes magic mouth wash for something similar to this in the past. She had some at home but it was expired. Wife also wants to know if this could be coming from starting Aricept about 3 weeks ago. No throat or tongue swelling, trouble breathing, dental pain.      Allergies  Allergen Reactions  . Ciprofloxacin Anaphylaxis  . Hydrocodone-Acetaminophen Nausea And Vomiting  . Penicillins     rash  . Sulfa Antibiotics      Current Outpatient Prescriptions:  .  [employer] LANCETS ULTRA-THIN 30G MISC, USE 1 LANCET TO CHECK SUGAR TWICE A DAY, Disp: 200 each, Rfl: 12 .  donepezil (ARICEPT) 5 MG tablet, Take 1 tablet (5 mg total) by mouth at bedtime., Disp: 90 tablet, Rfl: 3 .  FREESTYLE LITE test strip, CHECK SUGAR ONCE DAILY. DX: E11.9, Disp: 200 each, Rfl: 12 .  metFORMIN (GLUCOPHAGE) 1000 MG tablet, TAKE 1 TABLET (1,000 MG TOTAL) BY MOUTH DAILY AFTER SUPPER., Disp: 90 tablet, Rfl: 3 .  magic mouthwash w/lidocaine SOLN, Take 5 mLs by mouth 3 (three) times daily as needed for mouth pain., Disp: 120 mL, Rfl: 0  Review of Systems  Constitutional: Negative.   HENT:       Tongue lesion.  Eyes: Negative.   Respiratory: Negative.   Cardiovascular: Negative.     Gastrointestinal: Negative.   Endocrine: Negative.   Genitourinary: Negative.   Musculoskeletal: Negative.   Skin: Negative.   Allergic/Immunologic: Negative.   Neurological: Negative.   Hematological: Negative.   Psychiatric/Behavioral: Negative.     Social History  Substance Use Topics  . Smoking status: Former Smoker    Quit date: 02/20/1976  . Smokeless tobacco: Never Used  . Alcohol use No   Objective:   BP 128/60 (BP Location: Right Arm, Patient Position: Sitting, Cuff Size: Normal)   Pulse 86   Temp 98 F (36.7 C) (Oral)   Resp 16   Wt 202 lb (91.6 kg)   BMI 31.64 kg/m  Vitals:   10/13/16 0946  BP: 128/60  Pulse: 86  Resp: 16  Temp: 98 F (36.7 C)  TempSrc: Oral  Weight: 202 lb (91.6 kg)     Physical Exam  HENT:  Mouth/Throat: Oropharynx is clear and moist. No oropharyngeal exudate.    Neck: Neck supple.  Cardiovascular: Normal rate and regular rhythm.   Pulmonary/Chest: Effort normal and breath sounds normal.  Lymphadenopathy:    He has no cervical adenopathy.        Assessment & Plan:     1. Soreness of tongue  Provided Dukes magic mouth wash.  Counseled on swishing and spitting, please try not to swallow to avoid sedation.    - magic mouthwash w/lidocaine SOLN; Take 5 mLs by mouth 3 (three) times daily as needed for mouth pain.  Dispense: 120 mL; Refill: 0  Return if symptoms worsen or fail to improve.  The entirety of the information documented in the History of Present Illness, Review of Systems and Physical Exam were personally obtained by me. Portions of this information were initially documented by [place], [place] and reviewed by me for thoroughness and accuracy.            Trinna Post, PA-C  [facility]

## 2016-12-12 ENCOUNTER — Ambulatory Visit
Admission: RE | Admit: 2016-12-12 | Discharge: 2016-12-12 | Disposition: A | Discharge status: Home / Self Care | Payer: Medicare Other | Source: Ambulatory Visit | Attending: Oncology | Admitting: Oncology

## 2016-12-12 DIAGNOSIS — K760 Fatty (change of) liver, not elsewhere classified: Secondary | ICD-10-CM | POA: Insufficient documentation

## 2016-12-12 DIAGNOSIS — R918 Other nonspecific abnormal finding of lung field: Secondary | ICD-10-CM | POA: Diagnosis not present

## 2016-12-12 DIAGNOSIS — I2510 Atherosclerotic heart disease of native coronary artery without angina pectoris: Secondary | ICD-10-CM | POA: Diagnosis not present

## 2016-12-12 DIAGNOSIS — I700 Atherosclerosis of aorta: Secondary | ICD-10-CM | POA: Diagnosis not present

## 2016-12-12 LAB — POCT I-STAT CREATININE: CREATININE: 1.3 mg/dL — AB (ref 0.61–1.24)

## 2016-12-12 MED ORDER — IOPAMIDOL (ISOVUE-300) INJECTION 61%
75.0000 mL | Freq: Once | INTRAVENOUS | Status: AC | PRN
  Administered 2016-12-12: 75 mL via INTRAVENOUS

## 2016-12-15 NOTE — Progress Notes (Signed)
Joel Grant  Telephone:(336) [phone] Fax:(336) [phone]  ID: Joel Grant OB: Nov 25, 1926  MR#: [id number]  LKG#:[id number]  Patient Care Team: Jerrol Banana., MD as PCP - General (Family Medicine)  CHIEF COMPLAINT: Right upper lobe lung mass.  INTERVAL HISTORY: Patient returns to clinic today for further evaluation and discussion of his imaging results. Previous he, he was referred for biopsy, but patient ultimately refused. Given his underlying dementia, the entire history is given by his wife. Currently, patient feels well and is at his baseline. He has no neurologic complaints. He denies any recent fevers. He has a good appetite and denies weight loss. He denies any chest pain, shortness of breath, cough, or hemoptysis.  He has no nausea, vomiting, constipation, or diarrhea. He has no urinary complaints. Patient offers no specific complaints today.   REVIEW OF SYSTEMS:   Review of Systems  Constitutional: Negative.  Negative for fever, malaise/fatigue and weight loss.  Respiratory: Negative.  Negative for cough, hemoptysis, sputum production and shortness of breath.   Cardiovascular: Negative.  Negative for chest pain and leg swelling.  Gastrointestinal: Negative.  Negative for abdominal pain.  Genitourinary: Negative.   Musculoskeletal: Negative.   Skin: Negative.  Negative for rash.  Neurological: Negative.  Negative for weakness.  Psychiatric/Behavioral: Positive for memory loss. The patient is not nervous/anxious.     As per HPI. Otherwise, a complete review of systems is negative.  PAST MEDICAL HISTORY: Past Medical History:  Diagnosis Date  . Dementia   . Diabetes mellitus without complication ([place])   . Dyspnea   . Pneumonia   . Psoriasis   . Tuberculosis    per wife as a child    PAST SURGICAL HISTORY: Past Surgical History:  Procedure Laterality Date  . CATARACT EXTRACTION      FAMILY HISTORY: Family History  Problem Relation Age  of Onset  . Heart disease Mother   . Dementia Mother   . Diabetes Father   . Heart disease Father   . Peripheral vascular disease Brother   . Diabetes Brother     ADVANCED DIRECTIVES (Y/N):  N  HEALTH MAINTENANCE: Social History  Substance Use Topics  . Smoking status: Former Smoker    Quit date: 02/20/1976  . Smokeless tobacco: Never Used  . Alcohol use No     Colonoscopy:  PAP:  Bone density:  Lipid panel:  Allergies  Allergen Reactions  . Ciprofloxacin Anaphylaxis  . Hydrocodone-Acetaminophen Nausea And Vomiting  . Penicillins     rash  . Sulfa Antibiotics     Current Outpatient Prescriptions  Medication Sig Dispense Refill  . CVS LANCETS ULTRA-THIN 30G MISC USE 1 LANCET TO CHECK SUGAR TWICE A DAY 200 each 12  . donepezil (ARICEPT) 5 MG tablet Take 1 tablet (5 mg total) by mouth at bedtime. 90 tablet 3  . FREESTYLE LITE test strip CHECK SUGAR ONCE DAILY. DX: E11.9 200 each 12  . magic mouthwash w/lidocaine SOLN Take 5 mLs by mouth 3 (three) times daily as needed for mouth pain. 120 mL 0  . metFORMIN (GLUCOPHAGE) 1000 MG tablet TAKE 1 TABLET (1,000 MG TOTAL) BY MOUTH DAILY AFTER SUPPER. 90 tablet 3   No current facility-administered medications for this visit.     OBJECTIVE: Vitals:   12/17/16 1418  BP: (!) 150/76  Pulse: 79  Resp: 18  Temp: (!) 97 F (36.1 C)     Body mass index is 31.31 kg/m.    ECOG FS:1 -  Symptomatic but completely ambulatory  General: Well-developed, well-nourished, no acute distress. Eyes: Pink conjunctiva, anicteric sclera. Lungs: Clear to auscultation bilaterally. Heart: Regular rate and rhythm. No rubs, murmurs, or gallops. Abdomen: Soft, nontender, nondistended. No organomegaly noted, normoactive bowel sounds. Musculoskeletal: No edema, cyanosis, or clubbing. Neuro: Alert. Cranial nerves grossly intact. Skin: No rashes or petechiae noted. Psych: Normal affect.    LAB RESULTS:  Lab Results  Component Value Date   NA  135 06/05/2016   K 4.8 06/05/2016   CL 96 06/05/2016   CO2 25 06/05/2016   GLUCOSE 156 (H) 06/05/2016   BUN 15 06/05/2016   CREATININE 1.30 (H) 12/12/2016   CALCIUM 9.1 06/05/2016   PROT 6.1 06/05/2016   ALBUMIN 4.1 06/05/2016   AST 20 06/05/2016   ALT 27 06/05/2016   ALKPHOS 49 06/05/2016   BILITOT 0.4 06/05/2016   GFRNONAA 51 (L) 06/05/2016   GFRAA 59 (L) 06/05/2016    Lab Results  Component Value Date   WBC 7.6 06/05/2016   NEUTROABS 4.5 06/05/2016   HGB 13.7 06/05/2016   HCT 40.0 06/05/2016   MCV 87 06/05/2016   PLT 237 06/05/2016     STUDIES: Ct Chest W Contrast  Result Date: 12/12/2016 CLINICAL DATA:  Followup lung mass. EXAM: CT CHEST WITH CONTRAST TECHNIQUE: Multidetector CT imaging of the chest was performed during intravenous contrast administration. CONTRAST:  93mL ISOVUE-300 IOPAMIDOL (ISOVUE-300) INJECTION 61% COMPARISON:  08/06/2016 FINDINGS: Cardiovascular: Mild cardiac enlargement. Aortic atherosclerosis. Calcifications in the left main coronary artery, LAD and RCA coronary artery is noted. Mediastinum/Nodes: The trachea appears patent and is midline. Normal appearance of the esophagus. Index right paratracheal lymph node measures 1 cm, image 53 of series 2. Unchanged from previous exam. No hilar adenopathy. No axillary or supraclavicular adenopathy. Lungs/Pleura: Right upper lobe lung mass measures 3.1 x 4.2 by 5.0 cm. Previously this measured 4.0 x 3.4 by 4.1 cm. Anteromedial left upper lobe lung nodule measures 1.1 cm, image 51 of series 3. Unchanged from previous exam. Also within the left upper lobe is a stable 4 mm nodule, image 42 of series 3. Upper Abdomen: Marked diffuse hepatic steatosis is identified. Area of relative increased attenuation within the anterior right lobe of liver is unchanged measuring 1.2 cm, image 119 of series 2. Musculoskeletal: No chest wall abnormality. No acute or significant osseous findings. IMPRESSION: 1. Right upper lobe lung mass  demonstrates mild increase in size when compared with previous exam. Cannot rule out bronchogenic carcinoma. Consider further investigation with PET-CT and tissue sampling. 2. Smaller nodules in the left upper lobe are stable as is the borderline enlarged right paratracheal lymph node. 3. Aortic Atherosclerosis (ICD10-I70.0). Coronary artery calcifications noted 4. Hepatic steatosis. Electronically Signed   By: Kerby Moors M.D.   On: 12/12/2016 14:02    ASSESSMENT: Right upper lobe lung mass.  PLAN:    1. Right upper lobe lung mass: Highly suspicious for underlying malignancy. CT results reviewed independently and reported as above with continuing enlargement of known mass. Given patient's advanced age and underlying dementia, treatment would be difficult. Patient was initially referred to biopsy, but ultimately refused and was unable to lie flat for the procedure. Hospice and palliative care were again discussed, but patient and patient's family are not ready for this at this point. They wish to continue simple observation therefore will repeat CT scan in 6 months and patient will follow-up 1-2 days later.  2. Dementia: Patient appears to be at his baseline.  Approximately 20  minutes was spent in discussion of which greater than 50% was consultation.  Patient expressed understanding and was in agreement with this plan. He also understands that He can call clinic at any time with any questions, concerns, or complaints.   Cancer Staging No matching staging information was found for the patient.  Lloyd Huger, MD   12/17/2016 3:54 PM     [place]  Telephone:(336) [phone] Fax:(336) [phone]  ID: Joel Grant OB: 1926/11/27  MR#: [id number]  NLG#:[id number]  Patient Care Team: Jerrol Banana., MD as PCP - General (Family Medicine)  CHIEF COMPLAINT: Right upper lobe lung mass.  INTERVAL HISTORY: Patient returns to clinic today for further evaluation and  discussion of his imaging results. Previous he, he was referred for biopsy, but patient ultimately refused. Given his underlying dementia, the entire history is given by his wife and son. Currently, patient feels well and is at his baseline. He has no neurologic complaints. He denies any recent fevers. He has a good appetite and denies weight loss. He denies any chest pain, shortness of breath, cough, or hemoptysis.  He has no nausea, vomiting, constipation, or diarrhea. He has no urinary complaints. Patient offers no specific complaints today.   REVIEW OF SYSTEMS:   Review of Systems  Constitutional: Negative.  Negative for fever, malaise/fatigue and weight loss.  Respiratory: Negative.  Negative for cough, hemoptysis, sputum production and shortness of breath.   Cardiovascular: Negative.  Negative for chest pain and leg swelling.  Gastrointestinal: Negative.  Negative for abdominal pain.  Genitourinary: Negative.   Musculoskeletal: Negative.   Neurological: Negative.  Negative for weakness.  Psychiatric/Behavioral: Positive for memory loss. The patient is not nervous/anxious.     As per HPI. Otherwise, a complete review of systems is negative.  PAST MEDICAL HISTORY: Past Medical History:  Diagnosis Date  . Dementia   . Diabetes mellitus without complication ([place])   . Dyspnea   . Pneumonia   . Psoriasis   . Tuberculosis    per wife as a child    PAST SURGICAL HISTORY: Past Surgical History:  Procedure Laterality Date  . CATARACT EXTRACTION      FAMILY HISTORY: Family History  Problem Relation Age of Onset  . Heart disease Mother   . Dementia Mother   . Diabetes Father   . Heart disease Father   . Peripheral vascular disease Brother   . Diabetes Brother     ADVANCED DIRECTIVES (Y/N):  N  HEALTH MAINTENANCE: Social History  Substance Use Topics  . Smoking status: Former Smoker    Quit date: 02/20/1976  . Smokeless tobacco: Never Used  . Alcohol use No      Colonoscopy:  PAP:  Bone density:  Lipid panel:  Allergies  Allergen Reactions  . Ciprofloxacin Anaphylaxis  . Hydrocodone-Acetaminophen Nausea And Vomiting  . Penicillins     rash  . Sulfa Antibiotics     Current Outpatient Prescriptions  Medication Sig Dispense Refill  . CVS LANCETS ULTRA-THIN 30G MISC USE 1 LANCET TO CHECK SUGAR TWICE A DAY 200 each 12  . donepezil (ARICEPT) 5 MG tablet Take 1 tablet (5 mg total) by mouth at bedtime. 90 tablet 3  . FREESTYLE LITE test strip CHECK SUGAR ONCE DAILY. DX: E11.9 200 each 12  . magic mouthwash w/lidocaine SOLN Take 5 mLs by mouth 3 (three) times daily as needed for mouth pain. 120 mL 0  . metFORMIN (GLUCOPHAGE) 1000 MG tablet TAKE 1 TABLET (  1,000 MG TOTAL) BY MOUTH DAILY AFTER SUPPER. 90 tablet 3   No current facility-administered medications for this visit.     OBJECTIVE: Vitals:   12/17/16 1418  BP: (!) 150/76  Pulse: 79  Resp: 18  Temp: (!) 97 F (36.1 C)     Body mass index is 31.31 kg/m.    ECOG FS:1 - Symptomatic but completely ambulatory  General: Well-developed, well-nourished, no acute distress. Eyes: Pink conjunctiva, anicteric sclera. Lungs: Clear to auscultation bilaterally. Heart: Regular rate and rhythm. No rubs, murmurs, or gallops. Abdomen: Soft, nontender, nondistended. No organomegaly noted, normoactive bowel sounds. Musculoskeletal: No edema, cyanosis, or clubbing. Neuro: Alert. Cranial nerves grossly intact. Skin: No rashes or petechiae noted. Psych: Normal affect.    LAB RESULTS:  Lab Results  Component Value Date   NA 135 06/05/2016   K 4.8 06/05/2016   CL 96 06/05/2016   CO2 25 06/05/2016   GLUCOSE 156 (H) 06/05/2016   BUN 15 06/05/2016   CREATININE 1.30 (H) 12/12/2016   CALCIUM 9.1 06/05/2016   PROT 6.1 06/05/2016   ALBUMIN 4.1 06/05/2016   AST 20 06/05/2016   ALT 27 06/05/2016   ALKPHOS 49 06/05/2016   BILITOT 0.4 06/05/2016   GFRNONAA 51 (L) 06/05/2016   GFRAA 59 (L)  06/05/2016    Lab Results  Component Value Date   WBC 7.6 06/05/2016   NEUTROABS 4.5 06/05/2016   HGB 13.7 06/05/2016   HCT 40.0 06/05/2016   MCV 87 06/05/2016   PLT 237 06/05/2016     STUDIES: Ct Chest W Contrast  Result Date: 12/12/2016 CLINICAL DATA:  Followup lung mass. EXAM: CT CHEST WITH CONTRAST TECHNIQUE: Multidetector CT imaging of the chest was performed during intravenous contrast administration. CONTRAST:  25mL ISOVUE-300 IOPAMIDOL (ISOVUE-300) INJECTION 61% COMPARISON:  08/06/2016 FINDINGS: Cardiovascular: Mild cardiac enlargement. Aortic atherosclerosis. Calcifications in the left main coronary artery, LAD and RCA coronary artery is noted. Mediastinum/Nodes: The trachea appears patent and is midline. Normal appearance of the esophagus. Index right paratracheal lymph node measures 1 cm, image 53 of series 2. Unchanged from previous exam. No hilar adenopathy. No axillary or supraclavicular adenopathy. Lungs/Pleura: Right upper lobe lung mass measures 3.1 x 4.2 by 5.0 cm. Previously this measured 4.0 x 3.4 by 4.1 cm. Anteromedial left upper lobe lung nodule measures 1.1 cm, image 51 of series 3. Unchanged from previous exam. Also within the left upper lobe is a stable 4 mm nodule, image 42 of series 3. Upper Abdomen: Marked diffuse hepatic steatosis is identified. Area of relative increased attenuation within the anterior right lobe of liver is unchanged measuring 1.2 cm, image 119 of series 2. Musculoskeletal: No chest wall abnormality. No acute or significant osseous findings. IMPRESSION: 1. Right upper lobe lung mass demonstrates mild increase in size when compared with previous exam. Cannot rule out bronchogenic carcinoma. Consider further investigation with PET-CT and tissue sampling. 2. Smaller nodules in the left upper lobe are stable as is the borderline enlarged right paratracheal lymph node. 3. Aortic Atherosclerosis (ICD10-I70.0). Coronary artery calcifications noted 4. Hepatic  steatosis. Electronically Signed   By: Kerby Moors M.D.   On: 12/12/2016 14:02    ASSESSMENT: Right upper lobe lung mass.  PLAN:    1. Right upper lobe lung mass: Highly suspicious for underlying malignancy. CT results reviewed independently and reported as above with lung mass stable in size and contour. Given patient's advanced age and underlying dementia, treatment would be difficult. Patient was initially referred to biopsy, but  ultimately refused and was unable to lie flat for the procedure. Hospice and palliative care were discussed, but patient and patient's family are not ready for this at this point. They wish to continue simple observation therefore will repeat CT scan in 4 months and patient will follow-up 1-2 days later.  2. Acute renal failure: Resolved. 3. Dementia: Patient appears to be at his baseline.  Approximately 30 minutes was spent in discussion of which greater than 50% was consultation.  Patient expressed understanding and was in agreement with this plan. He also understands that He can call clinic at any time with any questions, concerns, or complaints.   Cancer Staging No matching staging information was found for the patient.  Lloyd Huger, MD   12/17/2016 3:54 PM

## 2016-12-17 ENCOUNTER — Inpatient Hospital Stay: Payer: Medicare Other | Attending: Oncology | Admitting: Oncology

## 2016-12-17 ENCOUNTER — Inpatient Hospital Stay: Payer: Medicare Other

## 2016-12-17 VITALS — BP 150/76 | HR 79 | Temp 97.0°F | Resp 18 | Wt 199.9 lb

## 2016-12-17 DIAGNOSIS — F0390 Unspecified dementia without behavioral disturbance: Secondary | ICD-10-CM | POA: Diagnosis not present

## 2016-12-17 DIAGNOSIS — I700 Atherosclerosis of aorta: Secondary | ICD-10-CM | POA: Diagnosis not present

## 2016-12-17 DIAGNOSIS — E119 Type 2 diabetes mellitus without complications: Secondary | ICD-10-CM | POA: Diagnosis not present

## 2016-12-17 DIAGNOSIS — R918 Other nonspecific abnormal finding of lung field: Secondary | ICD-10-CM | POA: Diagnosis not present

## 2016-12-17 DIAGNOSIS — Z87891 Personal history of nicotine dependence: Secondary | ICD-10-CM | POA: Diagnosis not present

## 2016-12-17 DIAGNOSIS — Z8611 Personal history of tuberculosis: Secondary | ICD-10-CM | POA: Diagnosis not present

## 2016-12-17 DIAGNOSIS — L409 Psoriasis, unspecified: Secondary | ICD-10-CM | POA: Insufficient documentation

## 2016-12-17 DIAGNOSIS — Z79899 Other long term (current) drug therapy: Secondary | ICD-10-CM | POA: Diagnosis not present

## 2016-12-17 DIAGNOSIS — Z7984 Long term (current) use of oral hypoglycemic drugs: Secondary | ICD-10-CM | POA: Insufficient documentation

## 2016-12-17 DIAGNOSIS — Z880 Allergy status to penicillin: Secondary | ICD-10-CM | POA: Diagnosis not present

## 2016-12-17 DIAGNOSIS — I2510 Atherosclerotic heart disease of native coronary artery without angina pectoris: Secondary | ICD-10-CM | POA: Insufficient documentation

## 2016-12-17 NOTE — Progress Notes (Signed)
Patient denies any concerns today.  

## 2017-01-07 ENCOUNTER — Ambulatory Visit (INDEPENDENT_AMBULATORY_CARE_PROVIDER_SITE_OTHER): Payer: Medicare Other | Admitting: Family Medicine

## 2017-01-07 ENCOUNTER — Encounter: Payer: Self-pay | Admitting: Family Medicine

## 2017-01-07 VITALS — BP 122/68 | HR 68 | Temp 98.5°F | Resp 16 | Wt 204.0 lb

## 2017-01-07 DIAGNOSIS — E119 Type 2 diabetes mellitus without complications: Secondary | ICD-10-CM | POA: Diagnosis not present

## 2017-01-07 DIAGNOSIS — F0280 Dementia in other diseases classified elsewhere without behavioral disturbance: Secondary | ICD-10-CM | POA: Diagnosis not present

## 2017-01-07 DIAGNOSIS — G308 Other Alzheimer's disease: Secondary | ICD-10-CM

## 2017-01-07 DIAGNOSIS — Z23 Encounter for immunization: Secondary | ICD-10-CM | POA: Diagnosis not present

## 2017-01-07 LAB — POCT GLYCOSYLATED HEMOGLOBIN (HGB A1C)
Est. average glucose Bld gHb Est-mCnc: 192
Hemoglobin A1C: 8.3

## 2017-01-07 MED ORDER — DONEPEZIL HCL 10 MG PO TABS: 10.0000 mg | ORAL_TABLET | Freq: Every day | ORAL | 3 refills | Status: DC

## 2017-01-07 NOTE — Patient Instructions (Signed)
Increase Aricept to 10mg  daily.

## 2017-01-07 NOTE — Progress Notes (Signed)
Patient: Joel Grant Male    DOB: 11-28-1926   83 y.o.   MRN: [id number] Visit Date: 01/07/2017  Today's Provider: Wilhemena Durie, MD   Chief Complaint  Patient presents with  . Diabetes  . Memory Loss   Subjective:    HPI    Diabetes Mellitus Type II, Follow-up:   Lab Results  Component Value Date   HGBA1C 8.3 01/07/2017   HGBA1C 8.7 09/17/2016   HGBA1C 8.8 (H) 06/05/2016    Last seen for diabetes 4 months ago.  Management since then includes no changes. He reports good compliance with treatment. He is not having side effects.  Current symptoms include none and have been stable. Home blood sugar records: fasting range: 160s  Episodes of hypoglycemia? no   Current Insulin Regimen: none Most Recent Eye Exam: up to date Weight trend: stable Prior visit with dietician: no Current diet: well balanced Current exercise: no regular exercise  Pertinent Labs:    Component Value Date/Time   CHOL 168 06/05/2016 0815   TRIG 217 (H) 06/05/2016 0815   HDL 34 (L) 06/05/2016 0815   LDLCALC 91 06/05/2016 0815   CREATININE 1.30 (H) 12/12/2016 1037    Wt Readings from Last 3 Encounters:  01/07/17 204 lb (92.5 kg)  12/17/16 199 lb 14.4 oz (90.7 kg)  10/13/16 202 lb (91.6 kg)    Alzheimer's Disease, Follow up: Patient was last seen in the office 4 months ago. His MMSE on last OV was 10. He was started on Aricept 5mg  daily. Patient's wife reports that she can not tell any difference in his symptoms since starting the medication. However, she does report that his symptoms have not gotten any worse.  Lung Mass on CT Followed by oncology.      Allergies  Allergen Reactions  . Ciprofloxacin Anaphylaxis  . Hydrocodone-Acetaminophen Nausea And Vomiting  . Penicillins     rash  . Sulfa Antibiotics      Current Outpatient Prescriptions:  .  [employer] LANCETS ULTRA-THIN 30G MISC, USE 1 LANCET TO CHECK SUGAR TWICE A DAY, Disp: 200 each, Rfl: 12 .  donepezil  (ARICEPT) 5 MG tablet, Take 1 tablet (5 mg total) by mouth at bedtime., Disp: 90 tablet, Rfl: 3 .  FREESTYLE LITE test strip, CHECK SUGAR ONCE DAILY. DX: E11.9, Disp: 200 each, Rfl: 12 .  metFORMIN (GLUCOPHAGE) 1000 MG tablet, TAKE 1 TABLET (1,000 MG TOTAL) BY MOUTH DAILY AFTER SUPPER., Disp: 90 tablet, Rfl: 3 .  magic mouthwash w/lidocaine SOLN, Take 5 mLs by mouth 3 (three) times daily as needed for mouth pain. (Patient not taking: Reported on 01/07/2017), Disp: 120 mL, Rfl: 0  Review of Systems  Constitutional: Negative.   HENT: Negative.   Eyes: Negative.   Respiratory: Negative.   Cardiovascular: Negative.   Gastrointestinal: Negative.   Endocrine: Negative.   Musculoskeletal: Negative.   Allergic/Immunologic: Negative.   Neurological: Negative.   Psychiatric/Behavioral: Negative.     Social History  Substance Use Topics  . Smoking status: Former Smoker    Quit date: 02/20/1976  . Smokeless tobacco: Never Used  . Alcohol use No   Objective:   BP 122/68 (BP Location: Right Arm, Patient Position: Sitting, Cuff Size: Normal)   Pulse 68   Temp 98.5 F (36.9 C)   Resp 16   Wt 204 lb (92.5 kg)   BMI 31.95 kg/m  Vitals:   01/07/17 0952  BP: 122/68  Pulse: 68  Resp: 16  Temp: 98.5 F (36.9 C)  Weight: 204 lb (92.5 kg)     Physical Exam  Constitutional: He is oriented to person, place, and time. He appears well-developed and well-nourished.  HENT:  Head: Normocephalic and atraumatic.  Right Ear: External ear normal.  Left Ear: External ear normal.  Nose: Nose normal.  Eyes: Conjunctivae are normal. No scleral icterus.  Neck: Neck supple. No thyromegaly present.  Cardiovascular: Normal rate, regular rhythm and normal heart sounds.   Pulmonary/Chest: Effort normal and breath sounds normal.  Abdominal: Soft.  Neurological: He is alert and oriented to person, place, and time.  Skin: Skin is warm and dry.  Psychiatric: He has a normal mood and affect. His behavior is  normal.        Assessment & Plan:     1. Type 2 diabetes mellitus without complication, without long-term current use of insulin (HCC) HgbA1c 8.3 today.last was 8.7. Continue current medications. Follow up in 4 months.  - POCT glycosylated hemoglobin (Hb A1C)  2. Alzheimer's disease of other onset without behavioral disturbance Increase Aricept to 10mg  daily. MMSE on next OV.  Consider adding Namenda. 3. Influenza vaccine needed Flu shot given today.  4.Lung Mass Likely cancer. Followed by oncology. No treatment.      Alixandra Alfieri Cranford Mon, MD  [facility]

## 2017-01-11 ENCOUNTER — Telehealth: Payer: Self-pay | Admitting: *Deleted

## 2017-01-11 ENCOUNTER — Ambulatory Visit (INDEPENDENT_AMBULATORY_CARE_PROVIDER_SITE_OTHER): Payer: Medicare Other | Admitting: Family Medicine

## 2017-01-11 ENCOUNTER — Encounter: Payer: Self-pay | Admitting: Family Medicine

## 2017-01-11 VITALS — BP 122/68 | HR 72 | Temp 98.1°F | Resp 16 | Wt 203.0 lb

## 2017-01-11 DIAGNOSIS — R21 Rash and other nonspecific skin eruption: Secondary | ICD-10-CM

## 2017-01-11 MED ORDER — HYDROXYZINE HCL 10 MG PO TABS: 10.0000 mg | ORAL_TABLET | Freq: Four times a day (QID) | ORAL | 0 refills | Status: DC | PRN

## 2017-01-11 NOTE — Patient Instructions (Addendum)
   Stop taking Aricept (donepizil) for one week to see if rash clears up.

## 2017-01-11 NOTE — Telephone Encounter (Addendum)
Patient walked into off ice concerning rash on his legs, stomach and back. Patient has had rash for about 2 weeks. Rash has gotten worse and has spread. Patient states rash is very itchy. Patient was scheduled to see provider at 9:30 am today.

## 2017-01-11 NOTE — Progress Notes (Signed)
       Patient: Joel Grant Male    DOB: 1927-03-25   83 y.o.   MRN: [id number] Visit Date: 01/11/2017  Today's Provider: Lelon Huh, MD   Chief Complaint  Patient presents with  . Rash   Subjective:    HPI Patient comes in today c/o rash on his abdomen and both of his legs. He reports that he has had the rash X 3 days. Patient's wife feels it may be due to his medication. She reports that the patient was seen on 10-9 and Dr. Rosanna Randy increased Aricept to 10 mg. He has not been using anything OTC for his symptoms.    Patients wife states that the patient told her he had a little bit of a rash for some time before the 10-9 visit, but it has become much worse since.     Allergies  Allergen Reactions  . Ciprofloxacin Anaphylaxis  . Hydrocodone-Acetaminophen Nausea And Vomiting  . Penicillins     rash  . Sulfa Antibiotics      Current Outpatient Prescriptions:  .  CVS LANCETS ULTRA-THIN 30G MISC, USE 1 LANCET TO CHECK SUGAR TWICE A DAY, Disp: 200 each, Rfl: 12 .  donepezil (ARICEPT) 10 MG tablet, Take 1 tablet (10 mg total) by mouth at bedtime., Disp: 90 tablet, Rfl: 3 .  FREESTYLE LITE test strip, CHECK SUGAR ONCE DAILY. DX: E11.9, Disp: 200 each, Rfl: 12 .  magic mouthwash w/lidocaine SOLN, Take 5 mLs by mouth 3 (three) times daily as needed for mouth pain. (Patient not taking: Reported on 01/07/2017), Disp: 120 mL, Rfl: 0 .  metFORMIN (GLUCOPHAGE) 1000 MG tablet, TAKE 1 TABLET (1,000 MG TOTAL) BY MOUTH DAILY AFTER SUPPER., Disp: 90 tablet, Rfl: 3  Review of Systems  Constitutional: Negative.   HENT: Negative.   Respiratory: Negative.   Cardiovascular: Negative.   Gastrointestinal: Negative.   Musculoskeletal: Negative.     Social History  Substance Use Topics  . Smoking status: Former Smoker    Quit date: 02/20/1976  . Smokeless tobacco: Never Used  . Alcohol use No   Objective:   BP 122/68 (BP Location: Right Arm, Patient Position: Sitting, Cuff Size: Normal)    Pulse 72   Temp 98.1 F (36.7 C)   Resp 16   Wt 203 lb (92.1 kg)   SpO2 97%   BMI 31.79 kg/m  Vitals:   01/11/17 0938  BP: 122/68  Pulse: 72  Resp: 16  Temp: 98.1 F (36.7 C)  SpO2: 97%  Weight: 203 lb (92.1 kg)     Physical Exam  Diffuse erythematous eruption across mid and lower abdomen, across back, and into both upper legs with some excoriations.     Assessment & Plan:     1. Rash Suspect reaction to donepezil which has worsened have dose increase. Will put medication on hold for the time being and reassess if it does not resolve within a week.  - hydrOXYzine (ATARAX/VISTARIL) 10 MG tablet; Take 1 tablet (10 mg total) by mouth every 6 (six) hours as needed for itching.  Dispense: 30 tablet; Refill: 0       Lelon Huh, MD  [facility]

## 2017-01-14 ENCOUNTER — Ambulatory Visit (INDEPENDENT_AMBULATORY_CARE_PROVIDER_SITE_OTHER): Payer: Medicare Other | Admitting: Family Medicine

## 2017-01-14 ENCOUNTER — Encounter: Payer: Self-pay | Admitting: Family Medicine

## 2017-01-14 DIAGNOSIS — R21 Rash and other nonspecific skin eruption: Secondary | ICD-10-CM | POA: Insufficient documentation

## 2017-01-14 MED ORDER — PREDNISONE 50 MG PO TABS: 50.0000 mg | ORAL_TABLET | Freq: Every day | ORAL | 0 refills | Status: DC

## 2017-01-14 NOTE — Patient Instructions (Signed)
Rash A rash is a change in the color of the skin. A rash can also change the way your skin feels. There are many different conditions and factors that can cause a rash. Follow these instructions at home: Pay attention to any changes in your symptoms. Follow these instructions to help with your condition: Medicine Take or apply over-the-counter and prescription medicines only as told by your health care provider. These may include:  Corticosteroid cream.  Anti-itch lotions.  Oral antihistamines.  Skin Care  Apply cool compresses to the affected areas.  Try taking a bath with: ? Epsom salts. Follow the instructions on the packaging. You can get these at your local pharmacy or grocery store. ? Baking soda. Pour a small amount into the bath as told by your health care provider. ? Colloidal oatmeal. Follow the instructions on the packaging. You can get this at your local pharmacy or grocery store.  Try applying baking soda paste to your skin. Stir water into baking soda until it reaches a paste-like consistency.  Do not scratch or rub your skin.  Avoid covering the rash. Make sure the rash is exposed to air as much as possible. General instructions  Avoid hot showers or baths, which can make itching worse. A cold shower may help.  Avoid scented soaps, detergents, and perfumes. Use gentle soaps, detergents, perfumes, and other cosmetic products.  Avoid any substance that causes your rash. Keep a journal to help track what causes your rash. Write down: ? What you eat. ? What cosmetic products you use. ? What you drink. ? What you wear. This includes jewelry.  Keep all follow-up visits as told by your health care provider. This is important. Contact a health care provider if:  You sweat at night.  You lose weight.  You urinate more than normal.  You feel weak.  You vomit.  Your skin or the whites of your eyes look yellow (jaundice).  Your skin: ? Tingles. ? Is  numb.  Your rash: ? Does not go away after several days. ? Gets worse.  You are: ? Unusually thirsty. ? More tired than normal.  You have: ? New symptoms. ? Pain in your abdomen. ? A fever. ? Diarrhea. Get help right away if:  You develop a rash that covers all or most of your body. The rash may or may not be painful.  You develop blisters that: ? Are on top of the rash. ? Grow larger or grow together. ? Are painful. ? Are inside your nose or mouth.  You develop a rash that: ? Looks like purple pinprick-sized spots all over your body. ? Has a "bull's eye" or looks like a target. ? Is not related to sun exposure, is red and painful, and causes your skin to peel. This information is not intended to replace advice given to you by your health care provider. Make sure you discuss any questions you have with your health care provider. Document Released: 03/09/2002 Document Revised: 08/23/2015 Document Reviewed: 08/04/2014 Elsevier Interactive Patient Education  2017 [employer].

## 2017-01-14 NOTE — Progress Notes (Signed)
Patient: Joel Grant Male    DOB: 1926-06-13   83 y.o.   MRN: [id number] Visit Date: 01/14/2017  Today's Provider: Lavon Paganini, MD   Chief Complaint  Patient presents with  . Rash   Subjective:    HPI     Follow up for Rash  The patient was last seen for this 3 days ago. Changes made at last visit include D/C Aricept (which was thought to be the cause of the rash), and starting Hydroxyzine.  He reports good compliance with treatment. He did stop his Aricept. He has not been taking hydroxyzine regularly, because his wife read that it could cause rash and she is worried that it would worsen his rash. He has been using cortisone cream intermittently He feels that condition is Worse. The rash is very pruritic and he is scratching all through the day. He is not having side effects.   ------------------------------------------------------------------------------------    Allergies  Allergen Reactions  . Ciprofloxacin Anaphylaxis  . Hydrocodone-Acetaminophen Nausea And Vomiting  . Penicillins     rash  . Sulfa Antibiotics      Current Outpatient Prescriptions:  .  CVS LANCETS ULTRA-THIN 30G MISC, USE 1 LANCET TO CHECK SUGAR TWICE A DAY, Disp: 200 each, Rfl: 12 .  FREESTYLE LITE test strip, CHECK SUGAR ONCE DAILY. DX: E11.9, Disp: 200 each, Rfl: 12 .  hydrOXYzine (ATARAX/VISTARIL) 10 MG tablet, Take 1 tablet (10 mg total) by mouth every 6 (six) hours as needed for itching., Disp: 30 tablet, Rfl: 0 .  metFORMIN (GLUCOPHAGE) 1000 MG tablet, TAKE 1 TABLET (1,000 MG TOTAL) BY MOUTH DAILY AFTER SUPPER., Disp: 90 tablet, Rfl: 3 .  donepezil (ARICEPT) 10 MG tablet, Take 1 tablet (10 mg total) by mouth at bedtime. (Patient not taking: Reported on 01/14/2017), Disp: 90 tablet, Rfl: 3 .  magic mouthwash w/lidocaine SOLN, Take 5 mLs by mouth 3 (three) times daily as needed for mouth pain. (Patient not taking: Reported on 01/07/2017), Disp: 120 mL, Rfl: 0  Review of Systems    Constitutional: Negative.   Respiratory: Negative.   Cardiovascular: Negative.   Gastrointestinal: Negative.   Musculoskeletal: Negative for arthralgias, gait problem and joint swelling.  Skin: Positive for color change and rash. Negative for pallor and wound.  Neurological: Negative.   Psychiatric/Behavioral:       No change from baseline    Social History  Substance Use Topics  . Smoking status: Former Smoker    Quit date: 02/20/1976  . Smokeless tobacco: Never Used  . Alcohol use No   Objective:   BP 112/64 (BP Location: Left Arm, Patient Position: Sitting, Cuff Size: Large)   Pulse 88   Temp 98.1 F (36.7 C) (Oral)   Resp 20   Wt 204 lb (92.5 kg)   SpO2 96%   BMI 31.95 kg/m  Vitals:   01/14/17 1332  BP: 112/64  Pulse: 88  Resp: 20  Temp: 98.1 F (36.7 C)  TempSrc: Oral  SpO2: 96%  Weight: 204 lb (92.5 kg)     Physical Exam  Constitutional: He appears well-developed and well-nourished. No distress.  HENT:  Head: Normocephalic and atraumatic.  Right Ear: External ear normal.  Left Ear: External ear normal.  Nose: Nose normal.  Mouth/Throat: Oropharynx is clear and moist.  Eyes: Conjunctivae are normal. No scleral icterus.  Cardiovascular: Normal rate, regular rhythm and normal heart sounds.   No murmur heard. Pulmonary/Chest: Effort normal and breath sounds normal. No respiratory distress.  He has no wheezes. He has no rales.  Neurological: He is alert.  Skin: Skin is warm and dry.  Diffuse erythematous maculopapular rash across lower and mid abdomen, back, upper arms, and legs No surrounding erythema, no purulent drainage to suggest cellulitis Nontender to palpation Diffuse excoriations over rash and surrounding areas  Vitals reviewed.      Assessment & Plan:      Problem List Items Addressed This Visit      Musculoskeletal and Integument   Rash and nonspecific skin eruption    Thought to be reaction from Aricept He is only stop taking this  medicine 2-3 days ago Seems to be worsening and now covers large portion of his body We will trial one week's worth of prednisone 50 mg daily to see if this improves the rash Advised patient and his wife that this could temporarily increase his blood sugars Continue to hold Aricept for this time Use hydroxyzine sparingly given potential to cause delirium  Follow-up with PCP in one week        Meds ordered this encounter  Medications  . predniSONE (DELTASONE) 50 MG tablet    Sig: Take 1 tablet (50 mg total) by mouth daily with breakfast.    Dispense:  7 tablet    Refill:  0        The entirety of the information documented in the History of Present Illness, Review of Systems and Physical Exam were personally obtained by me. Portions of this information were initially documented by Raquel Sarna Ratchford, CMA and reviewed by me for thoroughness and accuracy.     Lavon Paganini, MD  [facility]

## 2017-01-14 NOTE — Assessment & Plan Note (Signed)
Thought to be reaction from Aricept He is only stop taking this medicine 2-3 days ago Seems to be worsening and now covers large portion of his body We will trial one week's worth of prednisone 50 mg daily to see if this improves the rash Advised patient and his wife that this could temporarily increase his blood sugars Continue to hold Aricept for this time Use hydroxyzine sparingly given potential to cause delirium  Follow-up with PCP in one week

## 2017-01-21 ENCOUNTER — Ambulatory Visit (INDEPENDENT_AMBULATORY_CARE_PROVIDER_SITE_OTHER): Payer: Medicare Other | Admitting: Family Medicine

## 2017-01-21 ENCOUNTER — Encounter: Payer: Self-pay | Admitting: Family Medicine

## 2017-01-21 VITALS — BP 134/64 | HR 80 | Temp 97.7°F | Resp 14 | Wt 197.0 lb

## 2017-01-21 DIAGNOSIS — G308 Other Alzheimer's disease: Secondary | ICD-10-CM

## 2017-01-21 DIAGNOSIS — F0280 Dementia in other diseases classified elsewhere without behavioral disturbance: Secondary | ICD-10-CM | POA: Diagnosis not present

## 2017-01-21 DIAGNOSIS — R21 Rash and other nonspecific skin eruption: Secondary | ICD-10-CM | POA: Diagnosis not present

## 2017-01-21 MED ORDER — MEMANTINE HCL 5 MG PO TABS: 5.0000 mg | ORAL_TABLET | Freq: Two times a day (BID) | ORAL | 11 refills | Status: DC

## 2017-01-21 NOTE — Progress Notes (Signed)
Patient: Joel Grant Male    DOB: 25-Mar-1927   83 y.o.   MRN: [id number] Visit Date: 01/21/2017  Today's Provider: Wilhemena Durie, MD   Chief Complaint  Patient presents with  . Rash   Subjective:    HPI Pt was seen by Dr. Jacinto Reap on 01/14/17 for rash. Started prednisone. Continue to hold aricept and use hydroxyzine sparingly. Follow up with PCP in 1 week. Pt and wife reports that his rash is better and he finished the prednisone. Rash has resolved. Pt reports that he has no problems.      Allergies  Allergen Reactions  . Ciprofloxacin Anaphylaxis  . Hydrocodone-Acetaminophen Nausea And Vomiting  . Penicillins     rash  . Sulfa Antibiotics      Current Outpatient Prescriptions:  .  CVS LANCETS ULTRA-THIN 30G MISC, USE 1 LANCET TO CHECK SUGAR TWICE A DAY, Disp: 200 each, Rfl: 12 .  FREESTYLE LITE test strip, CHECK SUGAR ONCE DAILY. DX: E11.9, Disp: 200 each, Rfl: 12 .  metFORMIN (GLUCOPHAGE) 1000 MG tablet, TAKE 1 TABLET (1,000 MG TOTAL) BY MOUTH DAILY AFTER SUPPER., Disp: 90 tablet, Rfl: 3 .  donepezil (ARICEPT) 10 MG tablet, Take 1 tablet (10 mg total) by mouth at bedtime. (Patient not taking: Reported on 01/14/2017), Disp: 90 tablet, Rfl: 3 .  hydrOXYzine (ATARAX/VISTARIL) 10 MG tablet, Take 1 tablet (10 mg total) by mouth every 6 (six) hours as needed for itching. (Patient not taking: Reported on 01/21/2017), Disp: 30 tablet, Rfl: 0 .  magic mouthwash w/lidocaine SOLN, Take 5 mLs by mouth 3 (three) times daily as needed for mouth pain. (Patient not taking: Reported on 01/07/2017), Disp: 120 mL, Rfl: 0  Review of Systems  Constitutional: Negative.   HENT: Negative.   Eyes: Negative.   Respiratory: Negative.   Cardiovascular: Negative.   Gastrointestinal: Negative.   Endocrine: Negative.   Genitourinary: Negative.   Musculoskeletal: Negative.   Skin: Negative.   Allergic/Immunologic: Negative.   Neurological: Negative.   Hematological: Negative.     Psychiatric/Behavioral: Negative.     Social History  Substance Use Topics  . Smoking status: Former Smoker    Quit date: 02/20/1976  . Smokeless tobacco: Never Used  . Alcohol use No   Objective:   BP 134/64 (BP Location: Left Arm, Patient Position: Sitting, Cuff Size: Normal)   Pulse 80   Temp 97.7 F (36.5 C) (Oral)   Resp 14   Wt 197 lb (89.4 kg)   SpO2 98%   BMI 30.85 kg/m  Vitals:   01/21/17 1403  BP: 134/64  Pulse: 80  Resp: 14  Temp: 97.7 F (36.5 C)  TempSrc: Oral  SpO2: 98%  Weight: 197 lb (89.4 kg)     Physical Exam  Constitutional: He is oriented to person, place, and time. He appears well-developed and well-nourished.  Eyes: Pupils are equal, round, and reactive to light. Conjunctivae and EOM are normal.  Neck: Normal range of motion. Neck supple.  Cardiovascular: Normal rate, regular rhythm, normal heart sounds and intact distal pulses.   Pulmonary/Chest: Effort normal and breath sounds normal.  Musculoskeletal: Normal range of motion.  Neurological: He is alert and oriented to person, place, and time. He has normal reflexes.  Skin: Skin is warm and dry.  Psychiatric: He has a normal mood and affect. His behavior is normal. Judgment and thought content normal.        Assessment & Plan:     1.  Rash Resolved.   2. Alzheimer's disease of other onset without behavioral disturbance Aricept stopped due to possible allergy. - memantine (NAMENDA) 5 MG tablet; Take 1 tablet (5 mg total) by mouth 2 (two) times daily.  Dispense: 60 tablet; Refill: 11     HPI, Exam, and A&P Transcribed under the direction and in the presence of Richard L. Cranford Mon, MD  Electronically Signed: Katina Dung, CMA  I have done the exam and reviewed the above chart and it is accurate to the best of my knowledge. Development worker, community has been used in this note in any air is in the dictation or transcription are unintentional.  Wilhemena Durie, MD  [place]

## 2017-02-01 ENCOUNTER — Telehealth: Payer: Self-pay

## 2017-02-01 NOTE — Telephone Encounter (Signed)
Spoke with patient's wife and she states patient's sugar readings are better today. This morning was 229. Yesterday the nurse told patient to drink 1 glass of water once every hour for 4 hours. To keep check on sugar and if it continued to go up to call back.-Michalla Ringer V Narda Fundora, RMA

## 2017-02-01 NOTE — Telephone Encounter (Signed)
LMTCB-received reportfrom call a nurse from yesterday 01/31/17 where patient called due to sugar reading been 326. Need to know how patient is doing and if he is still having trouble with this-Mayur Duman V Crystalee Ventress, RMA

## 2017-02-07 ENCOUNTER — Ambulatory Visit (INDEPENDENT_AMBULATORY_CARE_PROVIDER_SITE_OTHER): Payer: Medicare Other | Admitting: Family Medicine

## 2017-02-07 VITALS — BP 126/68 | HR 76 | Temp 98.4°F | Resp 16 | Wt 199.0 lb

## 2017-02-07 DIAGNOSIS — F0280 Dementia in other diseases classified elsewhere without behavioral disturbance: Secondary | ICD-10-CM

## 2017-02-07 DIAGNOSIS — G308 Other Alzheimer's disease: Secondary | ICD-10-CM

## 2017-02-07 DIAGNOSIS — R918 Other nonspecific abnormal finding of lung field: Secondary | ICD-10-CM

## 2017-02-07 DIAGNOSIS — E119 Type 2 diabetes mellitus without complications: Secondary | ICD-10-CM

## 2017-02-07 NOTE — Progress Notes (Signed)
Joel Grant  MRN: [id number] DOB: 1926/04/30  Subjective:  HPI    the patient is an 83 year old male who presents for evaluation of elevated glucose since being on the Prednisone for his rash.  The patient started it on 01/14/17 and took 10 mg daily for 1 week.  He continues to have readings mainly 200-250 since he finished the Medicine.  He did have 1-2 readings in the 300 or 400 range.   The patient states the rash is gone and he feels fine.  His wife appears to be very nervous about the readings.    Patient Active Problem List   Diagnosis Date Noted  . Rash and nonspecific skin eruption 01/14/2017  . Mass of upper lobe of right lung 02/05/2016  . Sepsis ([place]) 01/28/2016  . Adenomatous colon polyp 12/09/2014  . Alzheimer's dementia 12/09/2014  . 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 Diabetes ([place]) 12/09/2014  . Glaucoma 12/09/2014  . Adult hypothyroidism 12/09/2014  . Adiposity 12/09/2014  . Arthritis, degenerative 12/09/2014  . Hypercholesterolemia without hypertriglyceridemia 12/09/2014  . Avitaminosis D 12/09/2014    Past Medical History:  Diagnosis Date  . Dementia   . Diabetes mellitus without complication ([place])   . Dyspnea   . Pneumonia   . Psoriasis   . Tuberculosis    per wife as a child    Social History   Socioeconomic History  . Marital status: Married    Spouse name: Not on file  . Number of children: Not on file  . Years of education: Not on file  . Highest education level: Not on file  Social Needs  . Financial resource strain: Not on file  . Food insecurity - worry: Not on file  . Food insecurity - inability: Not on file  . Transportation needs - medical: Not on file  . Transportation needs - non-medical: Not on file  Occupational History  . Not on file  Tobacco Use  . Smoking status: Former Smoker    Last attempt to quit: 02/20/1976    Years since quitting: 40.9  . Smokeless tobacco: Never Used  Substance and Sexual Activity  .  Alcohol use: No  . Drug use: No  . Sexual activity: Not on file  Other Topics Concern  . Not on file  Social History Narrative  . Not on file    Outpatient Encounter Medications as of 02/07/2017  Medication Sig  . [employer] LANCETS ULTRA-THIN 30G MISC USE 1 LANCET TO CHECK SUGAR TWICE A DAY  . FREESTYLE LITE test strip CHECK SUGAR ONCE DAILY. DX: E11.9  . hydrOXYzine (ATARAX/VISTARIL) 10 MG tablet Take 1 tablet (10 mg total) by mouth every 6 (six) hours as needed for itching.  . magic mouthwash w/lidocaine SOLN Take 5 mLs by mouth 3 (three) times daily as needed for mouth pain.  . metFORMIN (GLUCOPHAGE) 1000 MG tablet TAKE 1 TABLET (1,000 MG TOTAL) BY MOUTH DAILY AFTER SUPPER.  . [DISCONTINUED] memantine (NAMENDA) 5 MG tablet Take 1 tablet (5 mg total) by mouth 2 (two) times daily.   No facility-administered encounter medications on file as of 02/07/2017.     Allergies  Allergen Reactions  . Ciprofloxacin Anaphylaxis  . Hydrocodone-Acetaminophen Nausea And Vomiting  . Penicillins     rash  . Sulfa Antibiotics     Review of Systems  Constitutional: Negative.   Eyes: Negative.   Respiratory: Negative for cough, shortness of breath and wheezing.   Cardiovascular: Positive for claudication. Negative  for chest pain, palpitations, orthopnea and leg swelling.  Gastrointestinal: Negative.   Skin: Negative.   Neurological: Negative for dizziness and headaches.  Psychiatric/Behavioral: Negative.     Objective:  BP 126/68 (BP Location: Right Arm, Patient Position: Sitting, Cuff Size: Normal)   Pulse 76   Temp 98.4 F (36.9 C) (Oral)   Resp 16   Wt 199 lb (90.3 kg)   BMI 31.17 kg/m   Physical Exam  Constitutional: He is oriented to person, place, and time and well-developed, well-nourished, and in no distress.  HENT:  Head: Normocephalic and atraumatic.  Eyes: Conjunctivae are normal. No scleral icterus.  Neck: No thyromegaly present.  Cardiovascular: Normal rate, regular rhythm  and normal heart sounds.  Pulmonary/Chest: Effort normal and breath sounds normal.  Abdominal: Soft.  Neurological: He is alert and oriented to person, place, and time. Gait normal. GCS score is 15.  Skin: Skin is warm and dry.  Psychiatric: Mood, memory, affect and judgment normal.    Assessment and Plan :   Alzheimers Dementia Right Lung Mass TIIDM  I have done the exam and reviewed the chart and it is accurate to the best of my knowledge. Development worker, community has been used and  any errors in dictation or transcription are unintentional. Miguel Aschoff M.D. [facility]

## 2017-02-13 DIAGNOSIS — C44222 Squamous cell carcinoma of skin of right ear and external auricular canal: Secondary | ICD-10-CM | POA: Diagnosis not present

## 2017-02-13 DIAGNOSIS — D485 Neoplasm of uncertain behavior of skin: Secondary | ICD-10-CM | POA: Diagnosis not present

## 2017-02-18 ENCOUNTER — Ambulatory Visit (INDEPENDENT_AMBULATORY_CARE_PROVIDER_SITE_OTHER): Payer: Medicare Other

## 2017-02-18 VITALS — BP 132/72 | HR 88 | Temp 98.7°F | Ht 67.0 in | Wt 199.6 lb

## 2017-02-18 DIAGNOSIS — Z0000 Encounter for general adult medical examination without abnormal findings: Secondary | ICD-10-CM | POA: Diagnosis not present

## 2017-02-18 NOTE — Progress Notes (Signed)
Subjective:   Joel Grant is a 83 y.o. male who presents for Medicare Annual/Subsequent preventive examination.  Review of Systems:  N/A  Cardiac Risk Factors include: advanced age (>46men, >30 women);diabetes mellitus;dyslipidemia;obesity (BMI >30kg/m2);male gender     Objective:    Vitals: BP 132/72 (BP Location: Left Arm)   Pulse 88   Temp 98.7 F (37.1 C) (Oral)   Ht 5\' 7"  (1.702 m)   Wt 199 lb 9.6 oz (90.5 kg)   BMI 31.26 kg/m   Body mass index is 31.26 kg/m.  Tobacco Social History   Tobacco Use  Smoking Status Former Smoker  . Last attempt to quit: 02/20/1976  . Years since quitting: 41.0  Smokeless Tobacco Never Used     Counseling given: Not Answered   Past Medical History:  Diagnosis Date  . 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 Dementia   . Diabetes mellitus without complication ([place])   . Dyspnea   . Pneumonia   . Psoriasis   . Tuberculosis    per wife as a child   Past Surgical History:  Procedure Laterality Date  . CATARACT EXTRACTION     Family History  Problem Relation Age of Onset  . Heart disease Mother   . Dementia Mother   . Diabetes Father   . Heart disease Father   . Peripheral vascular disease Brother   . Diabetes Brother    Social History   Substance and Sexual Activity  Sexual Activity Not on file    Outpatient Encounter Medications as of 02/18/2017  Medication Sig  . [employer] LANCETS ULTRA-THIN 30G MISC USE 1 LANCET TO CHECK SUGAR TWICE A DAY  . FREESTYLE LITE test strip CHECK SUGAR ONCE DAILY. DX: E11.9  . magic mouthwash w/lidocaine SOLN Take 5 mLs by mouth 3 (three) times daily as needed for mouth pain.  . metFORMIN (GLUCOPHAGE) 1000 MG tablet TAKE 1 TABLET (1,000 MG TOTAL) BY MOUTH DAILY AFTER SUPPER.  . hydrOXYzine (ATARAX/VISTARIL) 10 MG tablet Take 1 tablet (10 mg total) by mouth every 6 (six) hours as needed for itching. (Patient not taking: Reported on 02/18/2017)   No  facility-administered encounter medications on file as of 02/18/2017.     Activities of Daily Living In your present state of health, do you have any difficulty performing the following activities: 02/18/2017  Hearing? Y  Comment per wife: pt had a hearing aid previously but not anymore  Vision? N  Difficulty concentrating or making decisions? Y  Walking or climbing stairs? N  Dressing or bathing? N  Doing errands, shopping? N  Preparing Food and eating ? N  Using the Toilet? N  In the past six months, have you accidently leaked urine? N  Do you have problems with loss of bowel control? N  Managing your Medications? N  Comment wife helps remind pt to take meds  Managing your Finances? Y  Housekeeping or managing your Housekeeping? N  Some recent data might be hidden    Patient Care Team: Jerrol Banana., MD as PCP - General (Family Medicine) Lloyd Huger, MD as Consulting Physician (Oncology)   Assessment:     Exercise Activities and Dietary recommendations Current Exercise Habits: The patient does not participate in regular exercise at present, Exercise limited by: orthopedic condition(s)(due to back pain)  Goals    . DIET - INCREASE WATER INTAKE     Recommend increasing water intake to 4  glasses a day.       Fall Risk Fall Risk  02/18/2017 02/16/2016 12/13/2014  Falls in the past year? No No No   Depression Screen PHQ 2/9 Scores 02/18/2017 02/16/2016 12/13/2014  PHQ - 2 Score 0 0 0    Cognitive Function: Pt declined screening today.  MMSE - Mini Mental State Exam 09/17/2016  Orientation to time 1  Orientation to Place 2  Registration 1  Attention/ Calculation 0  Recall 0  Language- name 2 objects 1  Language- repeat 0  Language- follow 3 step command 3  Language- read & follow direction 1  Write a sentence 0  Copy design 1  Total score 10     6CIT Screen 06/04/2016 02/16/2016  What Year? 4 points 4 points  What month? 3 points 0 points    What time? 0 points 3 points  Count back from 20 0 points 4 points  Months in reverse 4 points 4 points  Repeat phrase 10 points 10 points  Total Score 21 25    Immunization History  Administered Date(s) Administered  . Influenza, High Dose Seasonal PF 12/13/2014, 12/16/2015, 01/07/2017  . Pneumococcal Conjugate-13 03/18/2014  . Pneumococcal Polysaccharide-23 01/14/2013  . Td 11/02/2002   Screening Tests Health Maintenance  Topic Date Due  . OPHTHALMOLOGY EXAM  02/02/1939  . FOOT EXAM  04/17/2016  . URINE MICROALBUMIN  06/04/2017  . HEMOGLOBIN A1C  07/08/2017  . TETANUS/TDAP  01/15/2023  . INFLUENZA VACCINE  Completed  . PNA vac Low Risk Adult  Completed      Plan:  I have personally reviewed and addressed the Medicare Annual Wellness questionnaire and have noted the following in the patient's chart:  A. Medical and social history B. Use of alcohol, tobacco or illicit drugs  C. Current medications and supplements D. Functional ability and status E.  Nutritional status F.  Physical activity G. Advance directives H. List of other physicians I.  Hospitalizations, surgeries, and ER visits in previous 12 months J.  [place] such as hearing and vision if needed, cognitive and depression L. Referrals and appointments - none  In addition, I have reviewed and discussed with patient certain preventive protocols, quality metrics, and best practice recommendations. A written personalized care plan for preventive services as well as general preventive health recommendations were provided to patient.  See attached scanned questionnaire for additional information.   Signed,  Fabio Neighbors, LPN Nurse Health Advisor   MD Recommendations: Pt needs a diabetic foot exam at next OV. Pt declined setting up an eye exam in the future.

## 2017-02-18 NOTE — Patient Instructions (Signed)
Joel Grant , Thank you for taking time to come for your Medicare Wellness Visit. I appreciate your ongoing commitment to your health goals. Please review the following plan we discussed and let me know if I can assist you in the future.   Screening recommendations/referrals: Colonoscopy: Up to date Recommended yearly ophthalmology/optometry visit for glaucoma screening and checkup Recommended yearly dental visit for hygiene and checkup  Vaccinations: Influenza vaccine: completed Pneumococcal vaccine: completed series Tdap vaccine: up to date Shingles vaccine: declined  Advanced directives: Please bring a copy of your POA (Power of Attorney) and/or Living Will to your next appointment.   Conditions/risks identified: Recommend increasing water intake to 4 glasses a day.   Next appointment: 03/12/17  Preventive Care 65 Years and Older, Male Preventive care refers to lifestyle choices and visits with your health care provider that can promote health and wellness. What does preventive care include?  A yearly physical exam. This is also called an annual well check.  Dental exams once or twice a year.  Routine eye exams. Ask your health care provider how often you should have your eyes checked.  Personal lifestyle choices, including:  Daily care of your teeth and gums.  Regular physical activity.  Eating a healthy diet.  Avoiding tobacco and drug use.  Limiting alcohol use.  Practicing safe sex.  Taking low doses of aspirin every day.  Taking vitamin and mineral supplements as recommended by your health care provider. What happens during an annual well check? The services and screenings done by your health care provider during your annual well check will depend on your age, overall health, lifestyle risk factors, and family history of disease. Counseling  Your health care provider may ask you questions about your:  Alcohol use.  Tobacco use.  Drug use.  Emotional  well-being.  Home and relationship well-being.  Sexual activity.  Eating habits.  History of falls.  Memory and ability to understand (cognition).  Work and work Statistician. Screening  You may have the following tests or measurements:  Height, weight, and BMI.  Blood pressure.  Lipid and cholesterol levels. These may be checked every 5 years, or more frequently if you are over 104 years old.  Skin check.  Lung cancer screening. You may have this screening every year starting at age 47 if you have a 30-pack-year history of smoking and currently smoke or have quit within the past 15 years.  Fecal occult blood test (FOBT) of the stool. You may have this test every year starting at age 46.  Flexible sigmoidoscopy or colonoscopy. You may have a sigmoidoscopy every 5 years or a colonoscopy every 10 years starting at age 67.  Prostate cancer screening. Recommendations will vary depending on your family history and other risks.  Hepatitis C blood test.  Hepatitis B blood test.  Sexually transmitted disease (STD) testing.  Diabetes screening. This is done by checking your blood sugar (glucose) after you have not eaten for a while (fasting). You may have this done every 1-3 years.  Abdominal aortic aneurysm (AAA) screening. You may need this if you are a current or former smoker.  Osteoporosis. You may be screened starting at age 27 if you are at high risk. Talk with your health care provider about your test results, treatment options, and if necessary, the need for more tests. Vaccines  Your health care provider may recommend certain vaccines, such as:  Influenza vaccine. This is recommended every year.  Tetanus, diphtheria, and acellular pertussis (Tdap,  Td) vaccine. You may need a Td booster every 10 years.  Zoster vaccine. You may need this after age 28.  Pneumococcal 13-valent conjugate (PCV13) vaccine. One dose is recommended after age 53.  Pneumococcal  polysaccharide (PPSV23) vaccine. One dose is recommended after age 78. Talk to your health care provider about which screenings and vaccines you need and how often you need them. This information is not intended to replace advice given to you by your health care provider. Make sure you discuss any questions you have with your health care provider. Document Released: 04/15/2015 Document Revised: 12/07/2015 Document Reviewed: 01/18/2015 Elsevier Interactive Patient Education  2017 [place] Prevention in the Home Falls can cause injuries. They can happen to people of all ages. There are many things you can do to make your home safe and to help prevent falls. What can I do on the outside of my home?  Regularly fix the edges of walkways and driveways and fix any cracks.  Remove anything that might make you trip as you walk through a door, such as a raised step or threshold.  Trim any bushes or trees on the path to your home.  Use bright outdoor lighting.  Clear any walking paths of anything that might make someone trip, such as rocks or tools.  Regularly check to see if handrails are loose or broken. Make sure that both sides of any steps have handrails.  Any raised decks and porches should have guardrails on the edges.  Have any leaves, snow, or ice cleared regularly.  Use sand or salt on walking paths during winter.  Clean up any spills in your garage right away. This includes oil or grease spills. What can I do in the bathroom?  Use night lights.  Install grab bars by the toilet and in the tub and shower. Do not use towel bars as grab bars.  Use non-skid mats or decals in the tub or shower.  If you need to sit down in the shower, use a plastic, non-slip stool.  Keep the floor dry. Clean up any water that spills on the floor as soon as it happens.  Remove soap buildup in the tub or shower regularly.  Attach bath mats securely with double-sided non-slip rug  tape.  Do not have throw rugs and other things on the floor that can make you trip. What can I do in the bedroom?  Use night lights.  Make sure that you have a light by your bed that is easy to reach.  Do not use any sheets or blankets that are too big for your bed. They should not hang down onto the floor.  Have a firm chair that has side arms. You can use this for support while you get dressed.  Do not have throw rugs and other things on the floor that can make you trip. What can I do in the kitchen?  Clean up any spills right away.  Avoid walking on wet floors.  Keep items that you use a lot in easy-to-reach places.  If you need to reach something above you, use a strong step stool that has a grab bar.  Keep electrical cords out of the way.  Do not use floor polish or wax that makes floors slippery. If you must use wax, use non-skid floor wax.  Do not have throw rugs and other things on the floor that can make you trip. What can I do with my stairs?  Do not  leave any items on the stairs.  Make sure that there are handrails on both sides of the stairs and use them. Fix handrails that are broken or loose. Make sure that handrails are as long as the stairways.  Check any carpeting to make sure that it is firmly attached to the stairs. Fix any carpet that is loose or worn.  Avoid having throw rugs at the top or bottom of the stairs. If you do have throw rugs, attach them to the floor with carpet tape.  Make sure that you have a light switch at the top of the stairs and the bottom of the stairs. If you do not have them, ask someone to add them for you. What else can I do to help prevent falls?  Wear shoes that:  Do not have high heels.  Have rubber bottoms.  Are comfortable and fit you well.  Are closed at the toe. Do not wear sandals.  If you use a stepladder:  Make sure that it is fully opened. Do not climb a closed stepladder.  Make sure that both sides of the  stepladder are locked into place.  Ask someone to hold it for you, if possible.  Clearly mark and make sure that you can see:  Any grab bars or handrails.  First and last steps.  Where the edge of each step is.  Use tools that help you move around (mobility aids) if they are needed. These include:  Canes.  Walkers.  Scooters.  Crutches.  Turn on the lights when you go into a dark area. Replace any light bulbs as soon as they burn out.  Set up your furniture so you have a clear path. Avoid moving your furniture around.  If any of your floors are uneven, fix them.  If there are any pets around you, be aware of where they are.  Review your medicines with your doctor. Some medicines can make you feel dizzy. This can increase your chance of falling. Ask your doctor what other things that you can do to help prevent falls. This information is not intended to replace advice given to you by your health care provider. Make sure you discuss any questions you have with your health care provider. Document Released: 01/13/2009 Document Revised: 08/25/2015 Document Reviewed: 04/23/2014 Elsevier Interactive Patient Education  2017 [employer].

## 2017-03-12 ENCOUNTER — Ambulatory Visit: Payer: Self-pay | Admitting: Family Medicine

## 2017-03-29 DIAGNOSIS — C44222 Squamous cell carcinoma of skin of right ear and external auricular canal: Secondary | ICD-10-CM | POA: Diagnosis not present

## 2017-05-13 ENCOUNTER — Other Ambulatory Visit: Payer: Self-pay

## 2017-05-13 ENCOUNTER — Ambulatory Visit (INDEPENDENT_AMBULATORY_CARE_PROVIDER_SITE_OTHER): Payer: Medicare Other | Admitting: Family Medicine

## 2017-05-13 VITALS — BP 126/64 | HR 88 | Temp 98.2°F | Resp 14 | Wt 198.0 lb

## 2017-05-13 DIAGNOSIS — R918 Other nonspecific abnormal finding of lung field: Secondary | ICD-10-CM | POA: Diagnosis not present

## 2017-05-13 DIAGNOSIS — G308 Other Alzheimer's disease: Secondary | ICD-10-CM | POA: Diagnosis not present

## 2017-05-13 DIAGNOSIS — E039 Hypothyroidism, unspecified: Secondary | ICD-10-CM

## 2017-05-13 DIAGNOSIS — E119 Type 2 diabetes mellitus without complications: Secondary | ICD-10-CM

## 2017-05-13 DIAGNOSIS — F0280 Dementia in other diseases classified elsewhere without behavioral disturbance: Secondary | ICD-10-CM

## 2017-05-13 DIAGNOSIS — E7800 Pure hypercholesterolemia, unspecified: Secondary | ICD-10-CM

## 2017-05-13 DIAGNOSIS — R4182 Altered mental status, unspecified: Secondary | ICD-10-CM

## 2017-05-13 NOTE — Progress Notes (Signed)
Joel Grant  MRN: [id number] DOB: October 05, 1926  Subjective:  HPI   The patient is an 84 year old male who presents for follow up of his diabetes.  His last visit was on 02/07/17.  Since that time he has also seen the nurse health advisor on 02/18/17.   His last A1C was on 01/07/17 and it was 8.3.  The patient had his other labs done last on 06/05/16.  He is also due for his diabetic foot exam today. He continues to live at home with his wife. He has no behavioral issues ,no wandering,despite progressive Alzheimers dementia. Lung mass--likely cancer --is followed by Oncology.  Patient Active Problem List   Diagnosis Date Noted  . Rash and nonspecific skin eruption 01/14/2017  . Mass of upper lobe of right lung 02/05/2016  . Sepsis ([place]) 01/28/2016  . Adenomatous colon polyp 12/09/2014  . Alzheimer's dementia 12/09/2014  . 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 Diabetes ([place]) 12/09/2014  . Glaucoma 12/09/2014  . Adult hypothyroidism 12/09/2014  . Adiposity 12/09/2014  . Arthritis, degenerative 12/09/2014  . Hypercholesterolemia without hypertriglyceridemia 12/09/2014  . Avitaminosis D 12/09/2014    Past Medical History:  Diagnosis Date  . 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 Dementia   . Diabetes mellitus without complication ([place])   . Dyspnea   . Pneumonia   . Psoriasis   . Tuberculosis    per wife as a child    Social History   Socioeconomic History  . Marital status: Married    Spouse name: Not on file  . Number of children: Not on file  . Years of education: Not on file  . Highest education level: Not on file  Social Needs  . Financial resource strain: Not on file  . Food insecurity - worry: Not on file  . Food insecurity - inability: Not on file  . Transportation needs - medical: Not on file  . Transportation needs - non-medical: Not on file  Occupational History  . Not on file  Tobacco Use  . Smoking status:  Former Smoker    Last attempt to quit: 02/20/1976    Years since quitting: 41.2  . Smokeless tobacco: Never Used  Substance and Sexual Activity  . Alcohol use: No  . Drug use: No  . Sexual activity: Not on file  Other Topics Concern  . Not on file  Social History Narrative  . Not on file    Outpatient Encounter Medications as of 05/13/2017  Medication Sig  . [employer] LANCETS ULTRA-THIN 30G MISC USE 1 LANCET TO CHECK SUGAR TWICE A DAY  . metFORMIN (GLUCOPHAGE) 1000 MG tablet TAKE 1 TABLET (1,000 MG TOTAL) BY MOUTH DAILY AFTER SUPPER.  . [DISCONTINUED] FREESTYLE LITE test strip CHECK SUGAR ONCE DAILY. DX: E11.9  . [DISCONTINUED] hydrOXYzine (ATARAX/VISTARIL) 10 MG tablet Take 1 tablet (10 mg total) by mouth every 6 (six) hours as needed for itching. (Patient not taking: Reported on 02/18/2017)  . [DISCONTINUED] magic mouthwash w/lidocaine SOLN Take 5 mLs by mouth 3 (three) times daily as needed for mouth pain.   No facility-administered encounter medications on file as of 05/13/2017.     Allergies  Allergen Reactions  . Ciprofloxacin Anaphylaxis  . Hydrocodone-Acetaminophen Nausea And Vomiting  . Penicillins     rash  . Sulfa Antibiotics     Review of Systems  Constitutional: Negative for fever and malaise/fatigue.  Eyes: Negative.   Respiratory:  Negative for cough, shortness of breath and wheezing.   Cardiovascular: Negative for chest pain, palpitations, orthopnea and leg swelling.  Gastrointestinal: Negative.   Genitourinary: Positive for frequency.  Skin: Negative.   Neurological: Negative for weakness.  Endo/Heme/Allergies: Negative for polydipsia.  Psychiatric/Behavioral: Positive for memory loss.    Objective:  BP 126/64 (BP Location: Right Arm, Patient Position: Sitting, Cuff Size: Normal)   Pulse 88   Temp 98.2 F (36.8 C) (Oral)   Resp 14   Wt 198 lb (89.8 kg)   BMI 31.01 kg/m   Physical Exam  Constitutional: He is well-developed, well-nourished, and in no  distress.  HENT:  Head: Normocephalic and atraumatic.  Right Ear: External ear normal.  Left Ear: External ear normal.  Nose: Nose normal.  Eyes: Conjunctivae are normal.  Neck: No thyromegaly present.  Cardiovascular: Normal rate, regular rhythm and normal heart sounds.  Pulmonary/Chest: Effort normal and breath sounds normal.  Abdominal: Soft.  Neurological: Gait normal.  Skin: Skin is warm and dry.  Psychiatric: Mood and affect normal.    Assessment and Plan :  1. Type 2 diabetes mellitus without complication, without long-term current use of insulin (HCC)  - CBC with Differential/Platelet - Comprehensive metabolic panel - Hemoglobin A1c  2. Adult hypothyroidism  - CBC with Differential/Platelet - Comprehensive metabolic panel - TSH  3. Hypercholesterolemia without hypertriglyceridemia  - CBC with Differential/Platelet - Comprehensive metabolic panel - Lipid Panel With LDL/HDL Ratio  4. Altered mental status, unspecified altered mental status type  - Urine Culture 5.Alzheimers Dementia MMSE 12/30 today.Progressive dementia,no behavioral issues.Discussed this at length with wife who is primary caregiver. 6.Lung Mass  I have done the exam and reviewed the chart and it is accurate to the best of my knowledge. Development worker, community has been used and  any errors in dictation or transcription are unintentional. Miguel Aschoff M.D. [facility]

## 2017-05-14 DIAGNOSIS — E039 Hypothyroidism, unspecified: Secondary | ICD-10-CM | POA: Diagnosis not present

## 2017-05-14 DIAGNOSIS — E7800 Pure hypercholesterolemia, unspecified: Secondary | ICD-10-CM | POA: Diagnosis not present

## 2017-05-14 DIAGNOSIS — R4182 Altered mental status, unspecified: Secondary | ICD-10-CM | POA: Diagnosis not present

## 2017-05-14 DIAGNOSIS — E119 Type 2 diabetes mellitus without complications: Secondary | ICD-10-CM | POA: Diagnosis not present

## 2017-05-15 ENCOUNTER — Telehealth: Payer: Self-pay

## 2017-05-15 LAB — CBC WITH DIFFERENTIAL/PLATELET
BASOS ABS: 0.1 10*3/uL (ref 0.0–0.2)
Basos: 1 %
EOS (ABSOLUTE): 0.4 10*3/uL (ref 0.0–0.4)
Eos: 5 %
Hematocrit: 38.9 % (ref 37.5–51.0)
Hemoglobin: 13.1 g/dL (ref 13.0–17.7)
Immature Grans (Abs): 0 10*3/uL (ref 0.0–0.1)
Immature Granulocytes: 0 %
LYMPHS ABS: 2.3 10*3/uL (ref 0.7–3.1)
Lymphs: 25 %
MCH: 29.6 pg (ref 26.6–33.0)
MCHC: 33.7 g/dL (ref 31.5–35.7)
MCV: 88 fL (ref 79–97)
MONOCYTES: 6 %
MONOS ABS: 0.6 10*3/uL (ref 0.1–0.9)
Neutrophils Absolute: 5.8 10*3/uL (ref 1.4–7.0)
Neutrophils: 63 %
Platelets: 262 10*3/uL (ref 150–379)
RBC: 4.42 x10E6/uL (ref 4.14–5.80)
RDW: 13.8 % (ref 12.3–15.4)
WBC: 9.2 10*3/uL (ref 3.4–10.8)

## 2017-05-15 LAB — COMPREHENSIVE METABOLIC PANEL
ALK PHOS: 55 IU/L (ref 39–117)
ALT: 21 IU/L (ref 0–44)
AST: 14 IU/L (ref 0–40)
Albumin/Globulin Ratio: 1.8 (ref 1.2–2.2)
Albumin: 4.2 g/dL (ref 3.5–4.7)
BUN/Creatinine Ratio: 12 (ref 10–24)
BUN: 15 mg/dL (ref 8–27)
Bilirubin Total: 0.4 mg/dL (ref 0.0–1.2)
CO2: 20 mmol/L (ref 20–29)
CREATININE: 1.29 mg/dL — AB (ref 0.76–1.27)
Calcium: 9.1 mg/dL (ref 8.6–10.2)
Chloride: 101 mmol/L (ref 96–106)
GFR calc Af Amer: 57 mL/min/{1.73_m2} — ABNORMAL LOW (ref >59)
GFR calc non Af Amer: 49 mL/min/{1.73_m2} — ABNORMAL LOW (ref >59)
GLUCOSE: 218 mg/dL — AB (ref 65–99)
Globulin, Total: 2.3 g/dL (ref 1.5–4.5)
Potassium: 4.6 mmol/L (ref 3.5–5.2)
SODIUM: 138 mmol/L (ref 134–144)
Total Protein: 6.5 g/dL (ref 6.0–8.5)

## 2017-05-15 LAB — TSH: TSH: 2.2 u[IU]/mL (ref 0.450–4.500)

## 2017-05-15 LAB — LIPID PANEL WITH LDL/HDL RATIO
Cholesterol, Total: 159 mg/dL (ref 100–199)
HDL: 32 mg/dL — AB (ref >39)
LDL Calculated: 77 mg/dL (ref 0–99)
LDl/HDL Ratio: 2.4 ratio (ref 0.0–3.6)
TRIGLYCERIDES: 248 mg/dL — AB (ref 0–149)
VLDL Cholesterol Cal: 50 mg/dL — ABNORMAL HIGH (ref 5–40)

## 2017-05-15 LAB — HEMOGLOBIN A1C
Est. average glucose Bld gHb Est-mCnc: 209 mg/dL
HEMOGLOBIN A1C: 8.9 % — AB (ref 4.8–5.6)

## 2017-05-15 NOTE — Telephone Encounter (Signed)
Advised  ED   ----- Message from Jerrol Banana., MD sent at 05/15/2017  8:32 AM EST ----- Labs stable.

## 2017-05-15 NOTE — Telephone Encounter (Signed)
-----   Message from Jerrol Banana., MD sent at 05/15/2017  8:32 AM EST ----- Labs stable.

## 2017-05-16 LAB — URINE CULTURE

## 2017-05-17 ENCOUNTER — Encounter: Payer: Self-pay | Admitting: Family Medicine

## 2017-06-01 NOTE — Progress Notes (Signed)
[place]  Telephone:(336) [phone] Fax:(336) [phone]  ID: Joel Grant OB: Aug 12, 1926  MR#: [id number]  INO#:[id number]  Patient Care Team: Jerrol Banana., MD as PCP - General (Family Medicine) Lloyd Huger, MD as Consulting Physician (Oncology)  CHIEF COMPLAINT: Right upper lobe lung mass.  INTERVAL HISTORY: Patient returns to clinic today for further evaluation and discussion of his imaging results. Previous he, he was referred for biopsy, but patient ultimately refused. Given his underlying dementia, the entire history is given by his wife. Currently, patient feels well and is at his baseline. He has no neurologic complaints. He denies any recent fevers. He has a good appetite and denies weight loss. He denies any chest pain, shortness of breath, cough, or hemoptysis.  He has no nausea, vomiting, constipation, or diarrhea. He has no urinary complaints. Patient offers no specific complaints today.   REVIEW OF SYSTEMS:   Review of Systems  Constitutional: Negative.  Negative for fever, malaise/fatigue and weight loss.  Respiratory: Negative.  Negative for cough, hemoptysis, sputum production and shortness of breath.   Cardiovascular: Negative.  Negative for chest pain and leg swelling.  Gastrointestinal: Negative.  Negative for abdominal pain.  Genitourinary: Negative.   Musculoskeletal: Negative.   Skin: Negative.  Negative for rash.  Neurological: Negative.  Negative for weakness.  Psychiatric/Behavioral: Positive for memory loss. The patient is not nervous/anxious.     As per HPI. Otherwise, a complete review of systems is negative.  PAST MEDICAL HISTORY: Past Medical History:  Diagnosis Date  . 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 Dementia   . Diabetes mellitus without complication ([place])   . Dyspnea   . Pneumonia   . Psoriasis   . Tuberculosis    per wife as a child    PAST SURGICAL  HISTORY: Past Surgical History:  Procedure Laterality Date  . CATARACT EXTRACTION      FAMILY HISTORY: Family History  Problem Relation Age of Onset  . Heart disease Mother   . Dementia Mother   . Diabetes Father   . Heart disease Father   . Peripheral vascular disease Brother   . Diabetes Brother     ADVANCED DIRECTIVES (Y/N):  N  HEALTH MAINTENANCE: Social History   Tobacco Use  . Smoking status: Former Smoker    Last attempt to quit: 02/20/1976    Years since quitting: 41.3  . Smokeless tobacco: Never Used  Substance Use Topics  . Alcohol use: No  . Drug use: No     Colonoscopy:  PAP:  Bone density:  Lipid panel:  Allergies  Allergen Reactions  . Ciprofloxacin Anaphylaxis  . Hydrocodone-Acetaminophen Nausea And Vomiting  . Penicillins     rash  . Sulfa Antibiotics     Current Outpatient Medications  Medication Sig Dispense Refill  . [employer] LANCETS ULTRA-THIN 30G MISC USE 1 LANCET TO CHECK SUGAR TWICE A DAY 200 each 12  . metFORMIN (GLUCOPHAGE) 1000 MG tablet TAKE 1 TABLET (1,000 MG TOTAL) BY MOUTH DAILY AFTER SUPPER. 90 tablet 3   No current facility-administered medications for this visit.     OBJECTIVE: Vitals:   06/05/17 1126  BP: 125/78  Pulse: 87  Resp: 20  Temp: (!) 96.7 F (35.9 C)     Body mass index is 30.64 kg/m.    ECOG FS:1 - Symptomatic but completely ambulatory  General: Well-developed, well-nourished, no acute distress. Eyes: Pink conjunctiva, anicteric sclera. Lungs:  Clear to auscultation bilaterally. Heart: Regular rate and rhythm. No rubs, murmurs, or gallops. Abdomen: Soft, nontender, nondistended. No organomegaly noted, normoactive bowel sounds. Musculoskeletal: No edema, cyanosis, or clubbing. Neuro: Alert, confused. Cranial nerves grossly intact. Skin: No rashes or petechiae noted. Psych: Normal affect.    LAB RESULTS:  Lab Results  Component Value Date   NA 138 05/14/2017   K 4.6 05/14/2017   CL 101 05/14/2017    CO2 20 05/14/2017   GLUCOSE 218 (H) 05/14/2017   BUN 15 05/14/2017   CREATININE 1.29 (H) 05/14/2017   CALCIUM 9.1 05/14/2017   PROT 6.5 05/14/2017   ALBUMIN 4.2 05/14/2017   AST 14 05/14/2017   ALT 21 05/14/2017   ALKPHOS 55 05/14/2017   BILITOT 0.4 05/14/2017   GFRNONAA 49 (L) 05/14/2017   GFRAA 57 (L) 05/14/2017    Lab Results  Component Value Date   WBC 9.2 05/14/2017   NEUTROABS 5.8 05/14/2017   HGB 13.1 05/14/2017   HCT 38.9 05/14/2017   MCV 88 05/14/2017   PLT 262 05/14/2017     STUDIES: Ct Chest W Contrast  Result Date: 06/03/2017 CLINICAL DATA:  Follow-up lung mass EXAM: CT CHEST WITH CONTRAST TECHNIQUE: Multidetector CT imaging of the chest was performed during intravenous contrast administration. CONTRAST:  22mL ISOVUE-300 IOPAMIDOL (ISOVUE-300) INJECTION 61% COMPARISON:  12/12/2016 FINDINGS: Cardiovascular: The heart is normal in size. No pericardial effusion. No evidence of thoracic aortic aneurysm. Atherosclerotic calcifications of the aortic arch. Coronary atherosclerosis of the left main coronary artery, lad, and right coronary artery. Mediastinum/Nodes: 16 mm short axis low right paratracheal node (series 2/image 60), previously 10 mm. 14 mm short axis right hilar node (series 2/image 68), previously 10 mm. Visualized thyroid is notable for multiple small bilateral thyroid nodules, measuring up to 14 mm on the right. Lungs/Pleura: 3.8 x 6.5 x 4.4 cm spiculated right upper lobe mass, previously 3.1 x 4.2 x 3.9 cm when measured in a similar fashion. Mass is compatible with primary bronchogenic neoplasm and abuts the medial and lateral pleural with associated pleural tail (series 3/image 46). Additional 13 mm irregular nodule in the anterior left upper lobe (series 3/image 52), previously 11 mm, with pleural tail anteriorly. This appearance is worrisome for synchronous primary bronchogenic neoplasm. Additional 4 mm calcified granuloma in the anterior left upper lobe  (series 3/image 42), benign. No focal consolidation. No pleural effusion or pneumothorax. Upper Abdomen: Visualized upper abdomen is notable for hepatic steatosis with focal fatty sparing along the gallbladder fossa. Right renal cysts measuring up to 2.1 cm in the posterior right lower kidney. Musculoskeletal: Degenerative changes of the visualized thoracolumbar spine. IMPRESSION: 6.5 cm spiculated right upper lobe mass, previously 4.2 cm when measured in a similar fashion, compatible with primary bronchogenic neoplasm. Additional 13 mm nodule in the anterior left upper lobe, previously 11 mm, worrisome for synchronous primary bronchogenic neoplasm. Progressive mediastinal and right hilar nodal metastases. Aortic Atherosclerosis (ICD10-I70.0). Electronically Signed   By: Julian Hy M.D.   On: 06/03/2017 15:52    ASSESSMENT: Right upper lobe lung mass.  PLAN:    1. Right upper lobe lung mass: Highly suspicious for underlying malignancy. CT results from June 03, 2017 reviewed independently and reported as above with continuing enlargement of known mass. Given patient's advanced age and underlying dementia, treatment would be difficult. Patient was initially referred to biopsy, but ultimately refused and was unable to lie flat for the procedure. Hospice and palliative care were again discussed, but patient and patient's  family are not ready for this at this point.  They have agreed to consultation with radiation oncology for evaluation.  They wish to continue simple observation therefore will repeat CT scan in 6 months and patient will follow-up 1-2 days later.  2. Dementia: Patient appears to be at his baseline.  Approximately 20 minutes was spent in discussion of which greater than 50% was consultation.  Patient expressed understanding and was in agreement with this plan. He also understands that He can call clinic at any time with any questions, concerns, or complaints.   Cancer Staging No  matching staging information was found for the patient.  Lloyd Huger, MD   06/08/2017 7:33 AM     [place]  Telephone:(336) [phone] Fax:(336) [phone]  ID: Joel Grant OB: 05-Jun-1926  MR#: [id number]  NTI#:[id number]  Patient Care Team: Jerrol Banana., MD as PCP - General (Family Medicine) Lloyd Huger, MD as Consulting Physician (Oncology)  CHIEF COMPLAINT: Right upper lobe lung mass.  INTERVAL HISTORY: Patient returns to clinic today for further evaluation and discussion of his imaging results. Previous he, he was referred for biopsy, but patient ultimately refused. Given his underlying dementia, the entire history is given by his wife and son. Currently, patient feels well and is at his baseline. He has no neurologic complaints. He denies any recent fevers. He has a good appetite and denies weight loss. He denies any chest pain, shortness of breath, cough, or hemoptysis.  He has no nausea, vomiting, constipation, or diarrhea. He has no urinary complaints. Patient offers no specific complaints today.   REVIEW OF SYSTEMS:   Review of Systems  Constitutional: Negative.  Negative for fever, malaise/fatigue and weight loss.  Respiratory: Negative.  Negative for cough, hemoptysis, sputum production and shortness of breath.   Cardiovascular: Negative.  Negative for chest pain and leg swelling.  Gastrointestinal: Negative.  Negative for abdominal pain.  Genitourinary: Negative.   Musculoskeletal: Negative.   Neurological: Negative.  Negative for weakness.  Psychiatric/Behavioral: Positive for memory loss. The patient is not nervous/anxious.     As per HPI. Otherwise, a complete review of systems is negative.  PAST MEDICAL HISTORY: Past Medical History:  Diagnosis Date  . 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 Dementia   . Diabetes mellitus without complication ([place])   . Dyspnea   . Pneumonia   .  Psoriasis   . Tuberculosis    per wife as a child    PAST SURGICAL HISTORY: Past Surgical History:  Procedure Laterality Date  . CATARACT EXTRACTION      FAMILY HISTORY: Family History  Problem Relation Age of Onset  . Heart disease Mother   . Dementia Mother   . Diabetes Father   . Heart disease Father   . Peripheral vascular disease Brother   . Diabetes Brother     ADVANCED DIRECTIVES (Y/N):  N  HEALTH MAINTENANCE: Social History   Tobacco Use  . Smoking status: Former Smoker    Last attempt to quit: 02/20/1976    Years since quitting: 41.3  . Smokeless tobacco: Never Used  Substance Use Topics  . Alcohol use: No  . Drug use: No     Colonoscopy:  PAP:  Bone density:  Lipid panel:  Allergies  Allergen Reactions  . Ciprofloxacin Anaphylaxis  . Hydrocodone-Acetaminophen Nausea And Vomiting  . Penicillins     rash  . Sulfa Antibiotics  Current Outpatient Medications  Medication Sig Dispense Refill  . [employer] LANCETS ULTRA-THIN 30G MISC USE 1 LANCET TO CHECK SUGAR TWICE A DAY 200 each 12  . metFORMIN (GLUCOPHAGE) 1000 MG tablet TAKE 1 TABLET (1,000 MG TOTAL) BY MOUTH DAILY AFTER SUPPER. 90 tablet 3   No current facility-administered medications for this visit.     OBJECTIVE: Vitals:   06/05/17 1126  BP: 125/78  Pulse: 87  Resp: 20  Temp: (!) 96.7 F (35.9 C)     Body mass index is 30.64 kg/m.    ECOG FS:1 - Symptomatic but completely ambulatory  General: Well-developed, well-nourished, no acute distress. Eyes: Pink conjunctiva, anicteric sclera. Lungs: Clear to auscultation bilaterally. Heart: Regular rate and rhythm. No rubs, murmurs, or gallops. Abdomen: Soft, nontender, nondistended. No organomegaly noted, normoactive bowel sounds. Musculoskeletal: No edema, cyanosis, or clubbing. Neuro: Alert. Cranial nerves grossly intact. Skin: No rashes or petechiae noted. Psych: Normal affect.    LAB RESULTS:  Lab Results  Component Value Date    NA 138 05/14/2017   K 4.6 05/14/2017   CL 101 05/14/2017   CO2 20 05/14/2017   GLUCOSE 218 (H) 05/14/2017   BUN 15 05/14/2017   CREATININE 1.29 (H) 05/14/2017   CALCIUM 9.1 05/14/2017   PROT 6.5 05/14/2017   ALBUMIN 4.2 05/14/2017   AST 14 05/14/2017   ALT 21 05/14/2017   ALKPHOS 55 05/14/2017   BILITOT 0.4 05/14/2017   GFRNONAA 49 (L) 05/14/2017   GFRAA 57 (L) 05/14/2017    Lab Results  Component Value Date   WBC 9.2 05/14/2017   NEUTROABS 5.8 05/14/2017   HGB 13.1 05/14/2017   HCT 38.9 05/14/2017   MCV 88 05/14/2017   PLT 262 05/14/2017     STUDIES: Ct Chest W Contrast  Result Date: 06/03/2017 CLINICAL DATA:  Follow-up lung mass EXAM: CT CHEST WITH CONTRAST TECHNIQUE: Multidetector CT imaging of the chest was performed during intravenous contrast administration. CONTRAST:  70mL ISOVUE-300 IOPAMIDOL (ISOVUE-300) INJECTION 61% COMPARISON:  12/12/2016 FINDINGS: Cardiovascular: The heart is normal in size. No pericardial effusion. No evidence of thoracic aortic aneurysm. Atherosclerotic calcifications of the aortic arch. Coronary atherosclerosis of the left main coronary artery, lad, and right coronary artery. Mediastinum/Nodes: 16 mm short axis low right paratracheal node (series 2/image 60), previously 10 mm. 14 mm short axis right hilar node (series 2/image 68), previously 10 mm. Visualized thyroid is notable for multiple small bilateral thyroid nodules, measuring up to 14 mm on the right. Lungs/Pleura: 3.8 x 6.5 x 4.4 cm spiculated right upper lobe mass, previously 3.1 x 4.2 x 3.9 cm when measured in a similar fashion. Mass is compatible with primary bronchogenic neoplasm and abuts the medial and lateral pleural with associated pleural tail (series 3/image 46). Additional 13 mm irregular nodule in the anterior left upper lobe (series 3/image 52), previously 11 mm, with pleural tail anteriorly. This appearance is worrisome for synchronous primary bronchogenic neoplasm. Additional 4  mm calcified granuloma in the anterior left upper lobe (series 3/image 42), benign. No focal consolidation. No pleural effusion or pneumothorax. Upper Abdomen: Visualized upper abdomen is notable for hepatic steatosis with focal fatty sparing along the gallbladder fossa. Right renal cysts measuring up to 2.1 cm in the posterior right lower kidney. Musculoskeletal: Degenerative changes of the visualized thoracolumbar spine. IMPRESSION: 6.5 cm spiculated right upper lobe mass, previously 4.2 cm when measured in a similar fashion, compatible with primary bronchogenic neoplasm. Additional 13 mm nodule in the anterior left upper lobe, previously  11 mm, worrisome for synchronous primary bronchogenic neoplasm. Progressive mediastinal and right hilar nodal metastases. Aortic Atherosclerosis (ICD10-I70.0). Electronically Signed   By: Julian Hy M.D.   On: 06/03/2017 15:52    ASSESSMENT: Right upper lobe lung mass.  PLAN:    1. Right upper lobe lung mass: Highly suspicious for underlying malignancy. CT results reviewed independently and reported as above with lung mass stable in size and contour. Given patient's advanced age and underlying dementia, treatment would be difficult. Patient was initially referred to biopsy, but ultimately refused and was unable to lie flat for the procedure. Hospice and palliative care were discussed, but patient and patient's family are not ready for this at this point. They wish to continue simple observation therefore will repeat CT scan in 4 months and patient will follow-up 1-2 days later.  2. Acute renal failure: Resolved. 3. Dementia: Patient appears to be at his baseline.  Approximately 30 minutes was spent in discussion of which greater than 50% was consultation.  Patient expressed understanding and was in agreement with this plan. He also understands that He can call clinic at any time with any questions, concerns, or complaints.   Cancer Staging No matching  staging information was found for the patient.  Lloyd Huger, MD   06/08/2017 7:33 AM

## 2017-06-03 ENCOUNTER — Ambulatory Visit
Admission: RE | Admit: 2017-06-03 | Discharge: 2017-06-03 | Disposition: A | Discharge status: Home / Self Care | Payer: Medicare Other | Source: Ambulatory Visit | Attending: Oncology | Admitting: Oncology

## 2017-06-03 DIAGNOSIS — R918 Other nonspecific abnormal finding of lung field: Secondary | ICD-10-CM | POA: Diagnosis not present

## 2017-06-03 DIAGNOSIS — I700 Atherosclerosis of aorta: Secondary | ICD-10-CM | POA: Insufficient documentation

## 2017-06-03 DIAGNOSIS — C771 Secondary and unspecified malignant neoplasm of intrathoracic lymph nodes: Secondary | ICD-10-CM | POA: Insufficient documentation

## 2017-06-03 MED ORDER — IOPAMIDOL (ISOVUE-300) INJECTION 61%
75.0000 mL | Freq: Once | INTRAVENOUS | Status: AC | PRN
  Administered 2017-06-03: 75 mL via INTRAVENOUS

## 2017-06-05 ENCOUNTER — Other Ambulatory Visit: Payer: Self-pay

## 2017-06-05 ENCOUNTER — Inpatient Hospital Stay: Payer: Medicare Other | Attending: Internal Medicine | Admitting: Oncology

## 2017-06-05 VITALS — BP 125/78 | HR 87 | Temp 96.7°F | Resp 20 | Wt 195.6 lb

## 2017-06-05 DIAGNOSIS — E119 Type 2 diabetes mellitus without complications: Secondary | ICD-10-CM

## 2017-06-05 DIAGNOSIS — R918 Other nonspecific abnormal finding of lung field: Secondary | ICD-10-CM | POA: Diagnosis not present

## 2017-06-05 DIAGNOSIS — F0390 Unspecified dementia without behavioral disturbance: Secondary | ICD-10-CM | POA: Insufficient documentation

## 2017-06-05 DIAGNOSIS — Z87891 Personal history of nicotine dependence: Secondary | ICD-10-CM | POA: Diagnosis not present

## 2017-06-05 NOTE — Progress Notes (Signed)
Here for follow up. Per wife doing well.  Continues to have issues w " memory "per wife

## 2017-06-17 ENCOUNTER — Encounter: Payer: Self-pay | Admitting: Radiation Oncology

## 2017-06-17 ENCOUNTER — Other Ambulatory Visit: Payer: Self-pay

## 2017-06-17 ENCOUNTER — Other Ambulatory Visit: Payer: Self-pay | Admitting: *Deleted

## 2017-06-17 ENCOUNTER — Ambulatory Visit
Admission: RE | Admit: 2017-06-17 | Discharge: 2017-06-17 | Disposition: A | Discharge status: Home / Self Care | Payer: Medicare Other | Source: Ambulatory Visit | Attending: Radiation Oncology | Admitting: Radiation Oncology

## 2017-06-17 VITALS — BP 140/82 | HR 86 | Temp 96.9°F | Resp 20 | Ht 66.0 in | Wt 197.1 lb

## 2017-06-17 DIAGNOSIS — F0390 Unspecified dementia without behavioral disturbance: Secondary | ICD-10-CM | POA: Insufficient documentation

## 2017-06-17 DIAGNOSIS — R0602 Shortness of breath: Secondary | ICD-10-CM | POA: Diagnosis not present

## 2017-06-17 DIAGNOSIS — R918 Other nonspecific abnormal finding of lung field: Secondary | ICD-10-CM | POA: Insufficient documentation

## 2017-06-17 DIAGNOSIS — Z87891 Personal history of nicotine dependence: Secondary | ICD-10-CM | POA: Insufficient documentation

## 2017-06-17 DIAGNOSIS — Z8701 Personal history of pneumonia (recurrent): Secondary | ICD-10-CM | POA: Diagnosis not present

## 2017-06-17 DIAGNOSIS — Z8611 Personal history of tuberculosis: Secondary | ICD-10-CM | POA: Diagnosis not present

## 2017-06-17 DIAGNOSIS — E119 Type 2 diabetes mellitus without complications: Secondary | ICD-10-CM | POA: Diagnosis not present

## 2017-06-17 DIAGNOSIS — L409 Psoriasis, unspecified: Secondary | ICD-10-CM | POA: Diagnosis not present

## 2017-06-17 NOTE — Consult Note (Signed)
NEW PATIENT EVALUATION  Name: Joel Grant  MRN: [id number]  Date:   06/17/2017     DOB: 02/13/1927   This 84 y.o. male patient presents to the clinic for initial evaluation of probable right upper lobe non-small cell lung cancer.  REFERRING PHYSICIAN: Jerrol Banana.,*  CHIEF COMPLAINT:  Chief Complaint  Patient presents with  . Lung Cancer    Pt is here for initial consultation of lung mass    DIAGNOSIS: The encounter diagnosis was Mass of upper lobe of right lung.   PREVIOUS INVESTIGATIONS:  CT scans reviewed PET CT scan ordered No current pathology report Clinical notes reviewed  HPI: patient is a 84 year old male with significant dementia whowas discovered to have a right upper lobe mass back in November 2018. He was initially sent for biopsy although patient refused. He is fairly asymptomatic and according to the patient there is nothing wrong with him. He has been followed with serial CT scanswith most recent one showing again increased mass in his right upper lobe now measuring 6.5 cm previous scan 3 months prior it was 4.2 cm both compatible with primary bronchogenic carcinoma. He also is a 1.3 cm nodule in the anterior left upper lobe which is also slightly growing. He also has progressive mediastinal and right hilar nodal metastasis. Again patient is asymptomatic denies anything is wrong with him and is accompanied by his family members today. Patienthas obvious significant dementia.He specifically denies cough hemoptysis or chest tightness.  PLANNED TREATMENT REGIMEN: possible radiation therapy with curative intent  PAST MEDICAL HISTORY:  has a past medical history of Cancer ([place]), Dementia, Diabetes mellitus without complication ([place]), Dyspnea, Pneumonia, Psoriasis, and Tuberculosis.    PAST SURGICAL HISTORY:  Past Surgical History:  Procedure Laterality Date  . CATARACT EXTRACTION      FAMILY HISTORY: family history includes Dementia in his mother; Diabetes  in his brother and father; Heart disease in his father and mother; Peripheral vascular disease in his brother.  SOCIAL HISTORY:  reports that he quit smoking about 41 years ago. he has never used smokeless tobacco. He reports that he does not drink alcohol or use drugs.  ALLERGIES: Ciprofloxacin; Hydrocodone-acetaminophen; Penicillins; and Sulfa antibiotics  MEDICATIONS:  Current Outpatient Medications  Medication Sig Dispense Refill  . CVS LANCETS ULTRA-THIN 30G MISC USE 1 LANCET TO CHECK SUGAR TWICE A DAY 200 each 12  . metFORMIN (GLUCOPHAGE) 1000 MG tablet TAKE 1 TABLET (1,000 MG TOTAL) BY MOUTH DAILY AFTER SUPPER. 90 tablet 3   No current facility-administered medications for this encounter.     ECOG PERFORMANCE STATUS:  0 - Asymptomatic  REVIEW OF SYSTEMS: except for the significant dementia review of systems given by family. Patient denies any weight loss, fatigue, weakness, fever, chills or night sweats. Patient denies any loss of vision, blurred vision. Patient denies any ringing  of the ears or hearing loss. No irregular heartbeat. Patient denies heart murmur or history of fainting. Patient denies any chest pain or pain radiating to her upper extremities. Patient denies any shortness of breath, difficulty breathing at night, cough or hemoptysis. Patient denies any swelling in the lower legs. Patient denies any nausea vomiting, vomiting of blood, or coffee ground material in the vomitus. Patient denies any stomach pain. Patient states has had normal bowel movements no significant constipation or diarrhea. Patient denies any dysuria, hematuria or significant nocturia. Patient denies any problems walking, swelling in the joints or loss of balance. Patient denies any skin changes, loss of  hair or loss of weight. Patient denies any excessive worrying or anxiety or significant depression. Patient denies any problems with insomnia. Patient denies excessive thirst, polyuria, polydipsia. Patient  denies any swollen glands, patient denies easy bruising or easy bleeding. Patient denies any recent infections, allergies or URI. Patient "s visual fields have not changed significantly in recent time.    PHYSICAL EXAM: BP 140/82   Pulse 86   Temp (!) 96.9 F (36.1 C)   Resp 20   Ht 5\' 6"  (1.676 m)   Wt 197 lb 1.5 oz (89.4 kg)   BMI 31.81 kg/m  Well-developed well-nourished patient in NAD. HEENT reveals PERLA, EOMI, discs not visualized.  Oral cavity is clear. No oral mucosal lesions are identified. Neck is clear without evidence of cervical or supraclavicular adenopathy. Lungs are clear to A&P. Cardiac examination is essentially unremarkable with regular rate and rhythm without murmur rub or thrill. Abdomen is benign with no organomegaly or masses noted. Motor sensory and DTR levels are equal and symmetric in the upper and lower extremities. Cranial nerves II through XII are grossly intact. Proprioception is intact. No peripheral adenopathy or edema is identified. No motor or sensory levels are noted. Crude visual fields are within normal range.  LABORATORY DATA: no current pathology for review    RADIOLOGY RESULTS:CT scans reviewed PET CT scan ordered   IMPRESSION: probable locally advanced at least stage IIIa non-small cell lung cancer of the right upper lobe in 84 year old male  PLAN: at this time of ordered a PET CT scan. I've also set up and ordered CT simulation. I would based on PET scan and progressive enlargement of mass by CT criteria believe we are dealing with a non-small cell lung cancer. We will try to get him to PET CT scan and possible simulation. Family specifically refuses chemotherapy intervention. I would plan on delivering 7000 cGy to his area of mass in the right upper lobe as well as mediastinal nodes. Risks and benefits of treatment including possible dysphasia from radiation esophagitis alteration of blood counts skin reaction cough fatigue all were discussed in  detail. Based on his dementia we will go step-by-step to see if he is able toparticipate in his care and ability to deliver his treatments. Family comprehend my treatment plan well. I first set up and ordered CT sig relation after his PET/CT scan as a trial run for his treatments.  I would like to take this opportunity to thank you for allowing me to participate in the care of your patient.Noreene Filbert, MD

## 2017-06-24 ENCOUNTER — Ambulatory Visit
Admission: RE | Admit: 2017-06-24 | Discharge: 2017-06-24 | Disposition: A | Discharge status: Home / Self Care | Payer: Medicare Other | Source: Ambulatory Visit | Attending: Radiation Oncology | Admitting: Radiation Oncology

## 2017-06-24 DIAGNOSIS — I700 Atherosclerosis of aorta: Secondary | ICD-10-CM | POA: Diagnosis not present

## 2017-06-24 DIAGNOSIS — K760 Fatty (change of) liver, not elsewhere classified: Secondary | ICD-10-CM | POA: Insufficient documentation

## 2017-06-24 DIAGNOSIS — I708 Atherosclerosis of other arteries: Secondary | ICD-10-CM | POA: Insufficient documentation

## 2017-06-24 DIAGNOSIS — R918 Other nonspecific abnormal finding of lung field: Secondary | ICD-10-CM | POA: Diagnosis not present

## 2017-06-24 DIAGNOSIS — R590 Localized enlarged lymph nodes: Secondary | ICD-10-CM | POA: Diagnosis not present

## 2017-06-24 LAB — GLUCOSE, CAPILLARY: GLUCOSE-CAPILLARY: 183 mg/dL — AB (ref 65–99)

## 2017-06-24 MED ORDER — FLUDEOXYGLUCOSE F - 18 (FDG) INJECTION
10.2000 | Freq: Once | INTRAVENOUS | Status: AC | PRN
  Administered 2017-06-24: 9.47 via INTRAVENOUS

## 2017-06-27 ENCOUNTER — Ambulatory Visit
Admission: RE | Admit: 2017-06-27 | Discharge: 2017-06-27 | Disposition: A | Discharge status: Home / Self Care | Payer: Medicare Other | Source: Ambulatory Visit | Attending: Radiation Oncology | Admitting: Radiation Oncology

## 2017-06-27 DIAGNOSIS — Z510 Encounter for antineoplastic radiation therapy: Secondary | ICD-10-CM | POA: Insufficient documentation

## 2017-06-27 DIAGNOSIS — C3411 Malignant neoplasm of upper lobe, right bronchus or lung: Secondary | ICD-10-CM | POA: Diagnosis not present

## 2017-06-27 DIAGNOSIS — R918 Other nonspecific abnormal finding of lung field: Secondary | ICD-10-CM | POA: Diagnosis not present

## 2017-06-27 DIAGNOSIS — Z87891 Personal history of nicotine dependence: Secondary | ICD-10-CM | POA: Diagnosis not present

## 2017-07-02 DIAGNOSIS — Z87891 Personal history of nicotine dependence: Secondary | ICD-10-CM | POA: Diagnosis not present

## 2017-07-02 DIAGNOSIS — R918 Other nonspecific abnormal finding of lung field: Secondary | ICD-10-CM | POA: Insufficient documentation

## 2017-07-02 DIAGNOSIS — Z510 Encounter for antineoplastic radiation therapy: Secondary | ICD-10-CM | POA: Insufficient documentation

## 2017-07-05 ENCOUNTER — Other Ambulatory Visit: Payer: Self-pay | Admitting: *Deleted

## 2017-07-05 DIAGNOSIS — R918 Other nonspecific abnormal finding of lung field: Secondary | ICD-10-CM

## 2017-07-08 ENCOUNTER — Ambulatory Visit
Admission: RE | Admit: 2017-07-08 | Discharge: 2017-07-08 | Disposition: A | Discharge status: Home / Self Care | Payer: Medicare Other | Source: Ambulatory Visit | Attending: Radiation Oncology | Admitting: Radiation Oncology

## 2017-07-09 ENCOUNTER — Ambulatory Visit
Admission: RE | Admit: 2017-07-09 | Discharge: 2017-07-09 | Disposition: A | Discharge status: Home / Self Care | Payer: Medicare Other | Source: Ambulatory Visit | Attending: Radiation Oncology | Admitting: Radiation Oncology

## 2017-07-09 DIAGNOSIS — R918 Other nonspecific abnormal finding of lung field: Secondary | ICD-10-CM | POA: Diagnosis not present

## 2017-07-09 DIAGNOSIS — Z510 Encounter for antineoplastic radiation therapy: Secondary | ICD-10-CM | POA: Diagnosis not present

## 2017-07-09 DIAGNOSIS — Z87891 Personal history of nicotine dependence: Secondary | ICD-10-CM | POA: Diagnosis not present

## 2017-07-10 ENCOUNTER — Ambulatory Visit
Admission: RE | Admit: 2017-07-10 | Discharge: 2017-07-10 | Disposition: A | Discharge status: Home / Self Care | Payer: Medicare Other | Source: Ambulatory Visit | Attending: Radiation Oncology | Admitting: Radiation Oncology

## 2017-07-10 DIAGNOSIS — Z87891 Personal history of nicotine dependence: Secondary | ICD-10-CM | POA: Diagnosis not present

## 2017-07-10 DIAGNOSIS — Z510 Encounter for antineoplastic radiation therapy: Secondary | ICD-10-CM | POA: Diagnosis not present

## 2017-07-10 DIAGNOSIS — R918 Other nonspecific abnormal finding of lung field: Secondary | ICD-10-CM | POA: Diagnosis not present

## 2017-07-11 ENCOUNTER — Ambulatory Visit
Admission: RE | Admit: 2017-07-11 | Discharge: 2017-07-11 | Disposition: A | Discharge status: Home / Self Care | Payer: Medicare Other | Source: Ambulatory Visit | Attending: Radiation Oncology | Admitting: Radiation Oncology

## 2017-07-11 DIAGNOSIS — Z87891 Personal history of nicotine dependence: Secondary | ICD-10-CM | POA: Diagnosis not present

## 2017-07-11 DIAGNOSIS — Z510 Encounter for antineoplastic radiation therapy: Secondary | ICD-10-CM | POA: Diagnosis not present

## 2017-07-11 DIAGNOSIS — R918 Other nonspecific abnormal finding of lung field: Secondary | ICD-10-CM | POA: Diagnosis not present

## 2017-07-12 ENCOUNTER — Ambulatory Visit
Admission: RE | Admit: 2017-07-12 | Discharge: 2017-07-12 | Disposition: A | Discharge status: Home / Self Care | Payer: Medicare Other | Source: Ambulatory Visit | Attending: Radiation Oncology | Admitting: Radiation Oncology

## 2017-07-12 DIAGNOSIS — Z510 Encounter for antineoplastic radiation therapy: Secondary | ICD-10-CM | POA: Diagnosis not present

## 2017-07-12 DIAGNOSIS — Z87891 Personal history of nicotine dependence: Secondary | ICD-10-CM | POA: Diagnosis not present

## 2017-07-12 DIAGNOSIS — R918 Other nonspecific abnormal finding of lung field: Secondary | ICD-10-CM | POA: Diagnosis not present

## 2017-07-15 ENCOUNTER — Ambulatory Visit
Admission: RE | Admit: 2017-07-15 | Discharge: 2017-07-15 | Disposition: A | Discharge status: Home / Self Care | Payer: Medicare Other | Source: Ambulatory Visit | Attending: Radiation Oncology | Admitting: Radiation Oncology

## 2017-07-15 DIAGNOSIS — Z510 Encounter for antineoplastic radiation therapy: Secondary | ICD-10-CM | POA: Diagnosis not present

## 2017-07-15 DIAGNOSIS — Z87891 Personal history of nicotine dependence: Secondary | ICD-10-CM | POA: Diagnosis not present

## 2017-07-15 DIAGNOSIS — R918 Other nonspecific abnormal finding of lung field: Secondary | ICD-10-CM | POA: Diagnosis not present

## 2017-07-16 ENCOUNTER — Ambulatory Visit
Admission: RE | Admit: 2017-07-16 | Discharge: 2017-07-16 | Disposition: A | Discharge status: Home / Self Care | Payer: Medicare Other | Source: Ambulatory Visit | Attending: Radiation Oncology | Admitting: Radiation Oncology

## 2017-07-16 ENCOUNTER — Inpatient Hospital Stay: Payer: Medicare Other | Attending: Radiation Oncology

## 2017-07-16 DIAGNOSIS — Z510 Encounter for antineoplastic radiation therapy: Secondary | ICD-10-CM | POA: Diagnosis not present

## 2017-07-16 DIAGNOSIS — R918 Other nonspecific abnormal finding of lung field: Secondary | ICD-10-CM | POA: Diagnosis not present

## 2017-07-16 DIAGNOSIS — Z87891 Personal history of nicotine dependence: Secondary | ICD-10-CM | POA: Diagnosis not present

## 2017-07-16 LAB — CBC
HCT: 35.7 % — ABNORMAL LOW (ref 40.0–52.0)
Hemoglobin: 12.6 g/dL — ABNORMAL LOW (ref 13.0–18.0)
MCH: 30.3 pg (ref 26.0–34.0)
MCHC: 35.2 g/dL (ref 32.0–36.0)
MCV: 86.1 fL (ref 80.0–100.0)
Platelets: 270 10*3/uL (ref 150–440)
RBC: 4.14 MIL/uL — ABNORMAL LOW (ref 4.40–5.90)
RDW: 14.4 % (ref 11.5–14.5)
WBC: 9.7 10*3/uL (ref 3.8–10.6)

## 2017-07-17 ENCOUNTER — Ambulatory Visit
Admission: RE | Admit: 2017-07-17 | Discharge: 2017-07-17 | Disposition: A | Discharge status: Home / Self Care | Payer: Medicare Other | Source: Ambulatory Visit | Attending: Radiation Oncology | Admitting: Radiation Oncology

## 2017-07-17 DIAGNOSIS — Z87891 Personal history of nicotine dependence: Secondary | ICD-10-CM | POA: Diagnosis not present

## 2017-07-17 DIAGNOSIS — R918 Other nonspecific abnormal finding of lung field: Secondary | ICD-10-CM | POA: Diagnosis not present

## 2017-07-17 DIAGNOSIS — Z510 Encounter for antineoplastic radiation therapy: Secondary | ICD-10-CM | POA: Diagnosis not present

## 2017-07-18 ENCOUNTER — Ambulatory Visit
Admission: RE | Admit: 2017-07-18 | Discharge: 2017-07-18 | Disposition: A | Discharge status: Home / Self Care | Payer: Medicare Other | Source: Ambulatory Visit | Attending: Radiation Oncology | Admitting: Radiation Oncology

## 2017-07-18 DIAGNOSIS — Z510 Encounter for antineoplastic radiation therapy: Secondary | ICD-10-CM | POA: Diagnosis not present

## 2017-07-18 DIAGNOSIS — Z87891 Personal history of nicotine dependence: Secondary | ICD-10-CM | POA: Diagnosis not present

## 2017-07-18 DIAGNOSIS — R918 Other nonspecific abnormal finding of lung field: Secondary | ICD-10-CM | POA: Diagnosis not present

## 2017-07-19 ENCOUNTER — Ambulatory Visit
Admission: RE | Admit: 2017-07-19 | Discharge: 2017-07-19 | Disposition: A | Discharge status: Home / Self Care | Payer: Medicare Other | Source: Ambulatory Visit | Attending: Radiation Oncology | Admitting: Radiation Oncology

## 2017-07-19 DIAGNOSIS — Z510 Encounter for antineoplastic radiation therapy: Secondary | ICD-10-CM | POA: Diagnosis not present

## 2017-07-19 DIAGNOSIS — R918 Other nonspecific abnormal finding of lung field: Secondary | ICD-10-CM | POA: Diagnosis not present

## 2017-07-19 DIAGNOSIS — Z87891 Personal history of nicotine dependence: Secondary | ICD-10-CM | POA: Diagnosis not present

## 2017-07-22 ENCOUNTER — Ambulatory Visit
Admission: RE | Admit: 2017-07-22 | Discharge: 2017-07-22 | Disposition: A | Discharge status: Home / Self Care | Payer: Medicare Other | Source: Ambulatory Visit | Attending: Radiation Oncology | Admitting: Radiation Oncology

## 2017-07-22 DIAGNOSIS — Z87891 Personal history of nicotine dependence: Secondary | ICD-10-CM | POA: Diagnosis not present

## 2017-07-22 DIAGNOSIS — R918 Other nonspecific abnormal finding of lung field: Secondary | ICD-10-CM | POA: Diagnosis not present

## 2017-07-22 DIAGNOSIS — Z510 Encounter for antineoplastic radiation therapy: Secondary | ICD-10-CM | POA: Diagnosis not present

## 2017-07-23 ENCOUNTER — Ambulatory Visit
Admission: RE | Admit: 2017-07-23 | Discharge: 2017-07-23 | Disposition: A | Discharge status: Home / Self Care | Payer: Medicare Other | Source: Ambulatory Visit | Attending: Radiation Oncology | Admitting: Radiation Oncology

## 2017-07-23 ENCOUNTER — Inpatient Hospital Stay: Payer: Medicare Other

## 2017-07-23 ENCOUNTER — Other Ambulatory Visit: Payer: Self-pay

## 2017-07-23 DIAGNOSIS — R918 Other nonspecific abnormal finding of lung field: Secondary | ICD-10-CM

## 2017-07-23 DIAGNOSIS — Z87891 Personal history of nicotine dependence: Secondary | ICD-10-CM | POA: Diagnosis not present

## 2017-07-23 DIAGNOSIS — Z510 Encounter for antineoplastic radiation therapy: Secondary | ICD-10-CM | POA: Diagnosis not present

## 2017-07-23 LAB — CBC
HCT: 36.7 % — ABNORMAL LOW (ref 39.0–52.0)
HEMOGLOBIN: 12.6 g/dL — AB (ref 13.0–17.0)
MCH: 30 pg (ref 26.0–34.0)
MCHC: 34.4 g/dL (ref 30.0–36.0)
MCV: 87.3 fL (ref 78.0–100.0)
PLATELETS: 265 10*3/uL (ref 150–400)
RBC: 4.2 MIL/uL — AB (ref 4.22–5.81)
RDW: 14.2 % (ref 11.5–15.5)
WBC: 9.6 10*3/uL (ref 4.0–10.5)

## 2017-07-24 ENCOUNTER — Ambulatory Visit
Admission: RE | Admit: 2017-07-24 | Discharge: 2017-07-24 | Disposition: A | Discharge status: Home / Self Care | Payer: Medicare Other | Source: Ambulatory Visit | Attending: Radiation Oncology | Admitting: Radiation Oncology

## 2017-07-24 DIAGNOSIS — R918 Other nonspecific abnormal finding of lung field: Secondary | ICD-10-CM | POA: Diagnosis not present

## 2017-07-24 DIAGNOSIS — Z510 Encounter for antineoplastic radiation therapy: Secondary | ICD-10-CM | POA: Diagnosis not present

## 2017-07-24 DIAGNOSIS — Z87891 Personal history of nicotine dependence: Secondary | ICD-10-CM | POA: Diagnosis not present

## 2017-07-25 ENCOUNTER — Ambulatory Visit
Admission: RE | Admit: 2017-07-25 | Discharge: 2017-07-25 | Disposition: A | Discharge status: Home / Self Care | Payer: Medicare Other | Source: Ambulatory Visit | Attending: Radiation Oncology | Admitting: Radiation Oncology

## 2017-07-25 DIAGNOSIS — R918 Other nonspecific abnormal finding of lung field: Secondary | ICD-10-CM | POA: Diagnosis not present

## 2017-07-25 DIAGNOSIS — Z87891 Personal history of nicotine dependence: Secondary | ICD-10-CM | POA: Diagnosis not present

## 2017-07-25 DIAGNOSIS — Z510 Encounter for antineoplastic radiation therapy: Secondary | ICD-10-CM | POA: Diagnosis not present

## 2017-07-26 ENCOUNTER — Ambulatory Visit
Admission: RE | Admit: 2017-07-26 | Discharge: 2017-07-26 | Disposition: A | Discharge status: Home / Self Care | Payer: Medicare Other | Source: Ambulatory Visit | Attending: Radiation Oncology | Admitting: Radiation Oncology

## 2017-07-26 DIAGNOSIS — Z87891 Personal history of nicotine dependence: Secondary | ICD-10-CM | POA: Diagnosis not present

## 2017-07-26 DIAGNOSIS — Z510 Encounter for antineoplastic radiation therapy: Secondary | ICD-10-CM | POA: Diagnosis not present

## 2017-07-26 DIAGNOSIS — R918 Other nonspecific abnormal finding of lung field: Secondary | ICD-10-CM | POA: Diagnosis not present

## 2017-07-29 ENCOUNTER — Ambulatory Visit
Admission: RE | Admit: 2017-07-29 | Discharge: 2017-07-29 | Disposition: A | Discharge status: Home / Self Care | Payer: Medicare Other | Source: Ambulatory Visit | Attending: Radiation Oncology | Admitting: Radiation Oncology

## 2017-07-29 DIAGNOSIS — R918 Other nonspecific abnormal finding of lung field: Secondary | ICD-10-CM | POA: Diagnosis not present

## 2017-07-29 DIAGNOSIS — Z510 Encounter for antineoplastic radiation therapy: Secondary | ICD-10-CM | POA: Diagnosis not present

## 2017-07-29 DIAGNOSIS — Z87891 Personal history of nicotine dependence: Secondary | ICD-10-CM | POA: Diagnosis not present

## 2017-07-30 ENCOUNTER — Inpatient Hospital Stay: Payer: Medicare Other

## 2017-07-30 ENCOUNTER — Ambulatory Visit
Admission: RE | Admit: 2017-07-30 | Discharge: 2017-07-30 | Disposition: A | Discharge status: Home / Self Care | Payer: Medicare Other | Source: Ambulatory Visit | Attending: Radiation Oncology | Admitting: Radiation Oncology

## 2017-07-30 DIAGNOSIS — R918 Other nonspecific abnormal finding of lung field: Secondary | ICD-10-CM | POA: Diagnosis not present

## 2017-07-30 DIAGNOSIS — Z87891 Personal history of nicotine dependence: Secondary | ICD-10-CM | POA: Diagnosis not present

## 2017-07-30 DIAGNOSIS — Z510 Encounter for antineoplastic radiation therapy: Secondary | ICD-10-CM | POA: Diagnosis not present

## 2017-07-30 LAB — CBC
HCT: 34.7 % — ABNORMAL LOW (ref 40.0–52.0)
Hemoglobin: 12 g/dL — ABNORMAL LOW (ref 13.0–18.0)
MCH: 29.8 pg (ref 26.0–34.0)
MCHC: 34.5 g/dL (ref 32.0–36.0)
MCV: 86.2 fL (ref 80.0–100.0)
Platelets: 271 10*3/uL (ref 150–440)
RBC: 4.03 MIL/uL — ABNORMAL LOW (ref 4.40–5.90)
RDW: 14.6 % — ABNORMAL HIGH (ref 11.5–14.5)
WBC: 8.3 10*3/uL (ref 3.8–10.6)

## 2017-07-31 ENCOUNTER — Ambulatory Visit
Admission: RE | Admit: 2017-07-31 | Discharge: 2017-07-31 | Disposition: A | Discharge status: Home / Self Care | Payer: Medicare Other | Source: Ambulatory Visit | Attending: Radiation Oncology | Admitting: Radiation Oncology

## 2017-07-31 DIAGNOSIS — Z87891 Personal history of nicotine dependence: Secondary | ICD-10-CM | POA: Diagnosis not present

## 2017-07-31 DIAGNOSIS — R918 Other nonspecific abnormal finding of lung field: Secondary | ICD-10-CM | POA: Diagnosis not present

## 2017-07-31 DIAGNOSIS — Z510 Encounter for antineoplastic radiation therapy: Secondary | ICD-10-CM | POA: Diagnosis not present

## 2017-08-01 ENCOUNTER — Ambulatory Visit
Admission: RE | Admit: 2017-08-01 | Discharge: 2017-08-01 | Disposition: A | Discharge status: Home / Self Care | Payer: Medicare Other | Source: Ambulatory Visit | Attending: Radiation Oncology | Admitting: Radiation Oncology

## 2017-08-01 DIAGNOSIS — Z87891 Personal history of nicotine dependence: Secondary | ICD-10-CM | POA: Diagnosis not present

## 2017-08-01 DIAGNOSIS — Z510 Encounter for antineoplastic radiation therapy: Secondary | ICD-10-CM | POA: Diagnosis not present

## 2017-08-01 DIAGNOSIS — R918 Other nonspecific abnormal finding of lung field: Secondary | ICD-10-CM | POA: Diagnosis not present

## 2017-08-02 ENCOUNTER — Ambulatory Visit
Admission: RE | Admit: 2017-08-02 | Discharge: 2017-08-02 | Disposition: A | Discharge status: Home / Self Care | Payer: Medicare Other | Source: Ambulatory Visit | Attending: Radiation Oncology | Admitting: Radiation Oncology

## 2017-08-02 DIAGNOSIS — Z87891 Personal history of nicotine dependence: Secondary | ICD-10-CM | POA: Diagnosis not present

## 2017-08-02 DIAGNOSIS — Z510 Encounter for antineoplastic radiation therapy: Secondary | ICD-10-CM | POA: Diagnosis not present

## 2017-08-02 DIAGNOSIS — R918 Other nonspecific abnormal finding of lung field: Secondary | ICD-10-CM | POA: Diagnosis not present

## 2017-08-05 ENCOUNTER — Ambulatory Visit
Admission: RE | Admit: 2017-08-05 | Discharge: 2017-08-05 | Disposition: A | Discharge status: Home / Self Care | Payer: Medicare Other | Source: Ambulatory Visit | Attending: Radiation Oncology | Admitting: Radiation Oncology

## 2017-08-05 DIAGNOSIS — R918 Other nonspecific abnormal finding of lung field: Secondary | ICD-10-CM | POA: Diagnosis not present

## 2017-08-05 DIAGNOSIS — Z510 Encounter for antineoplastic radiation therapy: Secondary | ICD-10-CM | POA: Diagnosis not present

## 2017-08-05 DIAGNOSIS — Z87891 Personal history of nicotine dependence: Secondary | ICD-10-CM | POA: Diagnosis not present

## 2017-08-06 ENCOUNTER — Ambulatory Visit
Admission: RE | Admit: 2017-08-06 | Discharge: 2017-08-06 | Disposition: A | Discharge status: Home / Self Care | Payer: Medicare Other | Source: Ambulatory Visit | Attending: Radiation Oncology | Admitting: Radiation Oncology

## 2017-08-06 ENCOUNTER — Inpatient Hospital Stay: Payer: Medicare Other | Attending: Radiation Oncology

## 2017-08-06 DIAGNOSIS — R918 Other nonspecific abnormal finding of lung field: Secondary | ICD-10-CM | POA: Insufficient documentation

## 2017-08-06 DIAGNOSIS — Z510 Encounter for antineoplastic radiation therapy: Secondary | ICD-10-CM | POA: Diagnosis not present

## 2017-08-06 DIAGNOSIS — Z87891 Personal history of nicotine dependence: Secondary | ICD-10-CM | POA: Diagnosis not present

## 2017-08-06 LAB — CBC
HCT: 36 % — ABNORMAL LOW (ref 40.0–52.0)
HEMOGLOBIN: 12.4 g/dL — AB (ref 13.0–18.0)
MCH: 29.9 pg (ref 26.0–34.0)
MCHC: 34.5 g/dL (ref 32.0–36.0)
MCV: 86.6 fL (ref 80.0–100.0)
Platelets: 253 10*3/uL (ref 150–440)
RBC: 4.16 MIL/uL — ABNORMAL LOW (ref 4.40–5.90)
RDW: 14.6 % — ABNORMAL HIGH (ref 11.5–14.5)
WBC: 8.9 10*3/uL (ref 3.8–10.6)

## 2017-08-07 ENCOUNTER — Ambulatory Visit
Admission: RE | Admit: 2017-08-07 | Discharge: 2017-08-07 | Disposition: A | Discharge status: Home / Self Care | Payer: Medicare Other | Source: Ambulatory Visit | Attending: Radiation Oncology | Admitting: Radiation Oncology

## 2017-08-07 DIAGNOSIS — R918 Other nonspecific abnormal finding of lung field: Secondary | ICD-10-CM | POA: Diagnosis not present

## 2017-08-07 DIAGNOSIS — Z87891 Personal history of nicotine dependence: Secondary | ICD-10-CM | POA: Diagnosis not present

## 2017-08-07 DIAGNOSIS — Z510 Encounter for antineoplastic radiation therapy: Secondary | ICD-10-CM | POA: Diagnosis not present

## 2017-08-08 ENCOUNTER — Ambulatory Visit
Admission: RE | Admit: 2017-08-08 | Discharge: 2017-08-08 | Disposition: A | Discharge status: Home / Self Care | Payer: Medicare Other | Source: Ambulatory Visit | Attending: Radiation Oncology | Admitting: Radiation Oncology

## 2017-08-08 DIAGNOSIS — R918 Other nonspecific abnormal finding of lung field: Secondary | ICD-10-CM | POA: Diagnosis not present

## 2017-08-08 DIAGNOSIS — Z510 Encounter for antineoplastic radiation therapy: Secondary | ICD-10-CM | POA: Diagnosis not present

## 2017-08-08 DIAGNOSIS — Z87891 Personal history of nicotine dependence: Secondary | ICD-10-CM | POA: Diagnosis not present

## 2017-08-09 ENCOUNTER — Ambulatory Visit
Admission: RE | Admit: 2017-08-09 | Discharge: 2017-08-09 | Disposition: A | Discharge status: Home / Self Care | Payer: Medicare Other | Source: Ambulatory Visit | Attending: Radiation Oncology | Admitting: Radiation Oncology

## 2017-08-09 DIAGNOSIS — R918 Other nonspecific abnormal finding of lung field: Secondary | ICD-10-CM | POA: Diagnosis not present

## 2017-08-09 DIAGNOSIS — Z87891 Personal history of nicotine dependence: Secondary | ICD-10-CM | POA: Diagnosis not present

## 2017-08-09 DIAGNOSIS — Z510 Encounter for antineoplastic radiation therapy: Secondary | ICD-10-CM | POA: Diagnosis not present

## 2017-08-12 ENCOUNTER — Ambulatory Visit
Admission: RE | Admit: 2017-08-12 | Discharge: 2017-08-12 | Disposition: A | Discharge status: Home / Self Care | Payer: Medicare Other | Source: Ambulatory Visit | Attending: Radiation Oncology | Admitting: Radiation Oncology

## 2017-08-12 DIAGNOSIS — Z510 Encounter for antineoplastic radiation therapy: Secondary | ICD-10-CM | POA: Diagnosis not present

## 2017-08-12 DIAGNOSIS — Z87891 Personal history of nicotine dependence: Secondary | ICD-10-CM | POA: Diagnosis not present

## 2017-08-12 DIAGNOSIS — R918 Other nonspecific abnormal finding of lung field: Secondary | ICD-10-CM | POA: Diagnosis not present

## 2017-08-13 ENCOUNTER — Ambulatory Visit
Admission: RE | Admit: 2017-08-13 | Discharge: 2017-08-13 | Disposition: A | Discharge status: Home / Self Care | Payer: Medicare Other | Source: Ambulatory Visit | Attending: Radiation Oncology | Admitting: Radiation Oncology

## 2017-08-13 ENCOUNTER — Inpatient Hospital Stay: Payer: Medicare Other

## 2017-08-13 DIAGNOSIS — R918 Other nonspecific abnormal finding of lung field: Secondary | ICD-10-CM

## 2017-08-13 DIAGNOSIS — Z510 Encounter for antineoplastic radiation therapy: Secondary | ICD-10-CM | POA: Diagnosis not present

## 2017-08-13 DIAGNOSIS — Z87891 Personal history of nicotine dependence: Secondary | ICD-10-CM | POA: Diagnosis not present

## 2017-08-13 LAB — CBC
HCT: 33.8 % — ABNORMAL LOW (ref 40.0–52.0)
Hemoglobin: 12 g/dL — ABNORMAL LOW (ref 13.0–18.0)
MCH: 30.4 pg (ref 26.0–34.0)
MCHC: 35.3 g/dL (ref 32.0–36.0)
MCV: 85.9 fL (ref 80.0–100.0)
PLATELETS: 208 10*3/uL (ref 150–440)
RBC: 3.94 MIL/uL — ABNORMAL LOW (ref 4.40–5.90)
RDW: 15.5 % — ABNORMAL HIGH (ref 11.5–14.5)
WBC: 6.1 10*3/uL (ref 3.8–10.6)

## 2017-08-14 ENCOUNTER — Ambulatory Visit
Admission: RE | Admit: 2017-08-14 | Discharge: 2017-08-14 | Disposition: A | Discharge status: Home / Self Care | Payer: Medicare Other | Source: Ambulatory Visit | Attending: Radiation Oncology | Admitting: Radiation Oncology

## 2017-08-14 DIAGNOSIS — R918 Other nonspecific abnormal finding of lung field: Secondary | ICD-10-CM | POA: Diagnosis not present

## 2017-08-14 DIAGNOSIS — Z87891 Personal history of nicotine dependence: Secondary | ICD-10-CM | POA: Diagnosis not present

## 2017-08-14 DIAGNOSIS — Z510 Encounter for antineoplastic radiation therapy: Secondary | ICD-10-CM | POA: Diagnosis not present

## 2017-08-15 ENCOUNTER — Ambulatory Visit
Admission: RE | Admit: 2017-08-15 | Discharge: 2017-08-15 | Disposition: A | Discharge status: Home / Self Care | Payer: Medicare Other | Source: Ambulatory Visit | Attending: Radiation Oncology | Admitting: Radiation Oncology

## 2017-08-15 DIAGNOSIS — Z87891 Personal history of nicotine dependence: Secondary | ICD-10-CM | POA: Diagnosis not present

## 2017-08-15 DIAGNOSIS — R918 Other nonspecific abnormal finding of lung field: Secondary | ICD-10-CM | POA: Diagnosis not present

## 2017-08-15 DIAGNOSIS — Z510 Encounter for antineoplastic radiation therapy: Secondary | ICD-10-CM | POA: Diagnosis not present

## 2017-08-16 ENCOUNTER — Ambulatory Visit: Payer: Medicare Other

## 2017-08-19 ENCOUNTER — Ambulatory Visit
Admission: RE | Admit: 2017-08-19 | Discharge: 2017-08-19 | Disposition: A | Discharge status: Home / Self Care | Payer: Medicare Other | Source: Ambulatory Visit | Attending: Radiation Oncology | Admitting: Radiation Oncology

## 2017-08-19 DIAGNOSIS — R918 Other nonspecific abnormal finding of lung field: Secondary | ICD-10-CM | POA: Diagnosis not present

## 2017-08-19 DIAGNOSIS — Z87891 Personal history of nicotine dependence: Secondary | ICD-10-CM | POA: Diagnosis not present

## 2017-08-19 DIAGNOSIS — Z510 Encounter for antineoplastic radiation therapy: Secondary | ICD-10-CM | POA: Diagnosis not present

## 2017-08-20 ENCOUNTER — Inpatient Hospital Stay: Payer: Medicare Other

## 2017-08-20 ENCOUNTER — Ambulatory Visit
Admission: RE | Admit: 2017-08-20 | Discharge: 2017-08-20 | Disposition: A | Discharge status: Home / Self Care | Payer: Medicare Other | Source: Ambulatory Visit | Attending: Radiation Oncology | Admitting: Radiation Oncology

## 2017-08-20 DIAGNOSIS — R918 Other nonspecific abnormal finding of lung field: Secondary | ICD-10-CM

## 2017-08-20 DIAGNOSIS — Z87891 Personal history of nicotine dependence: Secondary | ICD-10-CM | POA: Diagnosis not present

## 2017-08-20 DIAGNOSIS — Z510 Encounter for antineoplastic radiation therapy: Secondary | ICD-10-CM | POA: Diagnosis not present

## 2017-08-20 LAB — CBC
HEMATOCRIT: 32.2 % — AB (ref 40.0–52.0)
Hemoglobin: 11.5 g/dL — ABNORMAL LOW (ref 13.0–18.0)
MCH: 30.3 pg (ref 26.0–34.0)
MCHC: 35.7 g/dL (ref 32.0–36.0)
MCV: 84.8 fL (ref 80.0–100.0)
PLATELETS: 245 10*3/uL (ref 150–440)
RBC: 3.8 MIL/uL — ABNORMAL LOW (ref 4.40–5.90)
RDW: 15.4 % — AB (ref 11.5–14.5)
WBC: 6.8 10*3/uL (ref 3.8–10.6)

## 2017-08-21 ENCOUNTER — Ambulatory Visit
Admission: RE | Admit: 2017-08-21 | Discharge: 2017-08-21 | Disposition: A | Discharge status: Home / Self Care | Payer: Medicare Other | Source: Ambulatory Visit | Attending: Radiation Oncology | Admitting: Radiation Oncology

## 2017-08-21 DIAGNOSIS — Z510 Encounter for antineoplastic radiation therapy: Secondary | ICD-10-CM | POA: Diagnosis not present

## 2017-08-21 DIAGNOSIS — Z87891 Personal history of nicotine dependence: Secondary | ICD-10-CM | POA: Diagnosis not present

## 2017-08-21 DIAGNOSIS — R918 Other nonspecific abnormal finding of lung field: Secondary | ICD-10-CM | POA: Diagnosis not present

## 2017-08-22 ENCOUNTER — Ambulatory Visit
Admission: RE | Admit: 2017-08-22 | Discharge: 2017-08-22 | Disposition: A | Discharge status: Home / Self Care | Payer: Medicare Other | Source: Ambulatory Visit | Attending: Radiation Oncology | Admitting: Radiation Oncology

## 2017-08-22 DIAGNOSIS — Z510 Encounter for antineoplastic radiation therapy: Secondary | ICD-10-CM | POA: Diagnosis not present

## 2017-08-22 DIAGNOSIS — R918 Other nonspecific abnormal finding of lung field: Secondary | ICD-10-CM | POA: Diagnosis not present

## 2017-08-22 DIAGNOSIS — Z87891 Personal history of nicotine dependence: Secondary | ICD-10-CM | POA: Diagnosis not present

## 2017-08-23 ENCOUNTER — Ambulatory Visit
Admission: RE | Admit: 2017-08-23 | Discharge: 2017-08-23 | Disposition: A | Discharge status: Home / Self Care | Payer: Medicare Other | Source: Ambulatory Visit | Attending: Radiation Oncology | Admitting: Radiation Oncology

## 2017-08-23 DIAGNOSIS — R918 Other nonspecific abnormal finding of lung field: Secondary | ICD-10-CM | POA: Diagnosis not present

## 2017-08-23 DIAGNOSIS — Z87891 Personal history of nicotine dependence: Secondary | ICD-10-CM | POA: Diagnosis not present

## 2017-08-23 DIAGNOSIS — Z510 Encounter for antineoplastic radiation therapy: Secondary | ICD-10-CM | POA: Diagnosis not present

## 2017-08-27 ENCOUNTER — Ambulatory Visit: Payer: Medicare Other

## 2017-08-27 ENCOUNTER — Ambulatory Visit
Admission: RE | Admit: 2017-08-27 | Discharge: 2017-08-27 | Disposition: A | Discharge status: Home / Self Care | Payer: Medicare Other | Source: Ambulatory Visit | Attending: Radiation Oncology | Admitting: Radiation Oncology

## 2017-08-27 DIAGNOSIS — Z87891 Personal history of nicotine dependence: Secondary | ICD-10-CM | POA: Diagnosis not present

## 2017-08-27 DIAGNOSIS — Z510 Encounter for antineoplastic radiation therapy: Secondary | ICD-10-CM | POA: Diagnosis not present

## 2017-08-27 DIAGNOSIS — R918 Other nonspecific abnormal finding of lung field: Secondary | ICD-10-CM | POA: Diagnosis not present

## 2017-08-28 ENCOUNTER — Ambulatory Visit
Admission: RE | Admit: 2017-08-28 | Discharge: 2017-08-28 | Disposition: A | Discharge status: Home / Self Care | Payer: Medicare Other | Source: Ambulatory Visit | Attending: Radiation Oncology | Admitting: Radiation Oncology

## 2017-08-28 DIAGNOSIS — R918 Other nonspecific abnormal finding of lung field: Secondary | ICD-10-CM | POA: Diagnosis not present

## 2017-08-28 DIAGNOSIS — Z510 Encounter for antineoplastic radiation therapy: Secondary | ICD-10-CM | POA: Diagnosis not present

## 2017-08-28 DIAGNOSIS — Z87891 Personal history of nicotine dependence: Secondary | ICD-10-CM | POA: Diagnosis not present

## 2017-09-05 ENCOUNTER — Other Ambulatory Visit: Payer: Self-pay

## 2017-09-05 ENCOUNTER — Emergency Department: Payer: Medicare Other

## 2017-09-05 ENCOUNTER — Emergency Department
Admission: EM | Admit: 2017-09-05 | Discharge: 2017-09-05 | Disposition: A | Discharge status: Home / Self Care | Payer: Medicare Other | Attending: Emergency Medicine | Admitting: Emergency Medicine

## 2017-09-05 ENCOUNTER — Telehealth: Payer: Self-pay

## 2017-09-05 DIAGNOSIS — F0390 Unspecified dementia without behavioral disturbance: Secondary | ICD-10-CM | POA: Diagnosis not present

## 2017-09-05 DIAGNOSIS — Z85828 Personal history of other malignant neoplasm of skin: Secondary | ICD-10-CM | POA: Diagnosis not present

## 2017-09-05 DIAGNOSIS — E119 Type 2 diabetes mellitus without complications: Secondary | ICD-10-CM | POA: Diagnosis not present

## 2017-09-05 DIAGNOSIS — Z87891 Personal history of nicotine dependence: Secondary | ICD-10-CM | POA: Insufficient documentation

## 2017-09-05 DIAGNOSIS — E039 Hypothyroidism, unspecified: Secondary | ICD-10-CM | POA: Diagnosis not present

## 2017-09-05 DIAGNOSIS — R4182 Altered mental status, unspecified: Secondary | ICD-10-CM | POA: Diagnosis not present

## 2017-09-05 DIAGNOSIS — R0989 Other specified symptoms and signs involving the circulatory and respiratory systems: Secondary | ICD-10-CM | POA: Diagnosis not present

## 2017-09-05 LAB — COMPREHENSIVE METABOLIC PANEL
ALBUMIN: 3.5 g/dL (ref 3.5–5.0)
ALK PHOS: 43 U/L (ref 38–126)
ALT: 21 U/L (ref 17–63)
AST: 25 U/L (ref 15–41)
Anion gap: 9 (ref 5–15)
BUN: 26 mg/dL — AB (ref 6–20)
CALCIUM: 8.5 mg/dL — AB (ref 8.9–10.3)
CHLORIDE: 101 mmol/L (ref 101–111)
CO2: 21 mmol/L — AB (ref 22–32)
CREATININE: 1.46 mg/dL — AB (ref 0.61–1.24)
GFR calc Af Amer: 48 mL/min — ABNORMAL LOW (ref >60)
GFR calc non Af Amer: 41 mL/min — ABNORMAL LOW (ref >60)
GLUCOSE: 238 mg/dL — AB (ref 65–99)
Potassium: 4.1 mmol/L (ref 3.5–5.1)
SODIUM: 131 mmol/L — AB (ref 135–145)
Total Bilirubin: 0.5 mg/dL (ref 0.3–1.2)
Total Protein: 6.5 g/dL (ref 6.5–8.1)

## 2017-09-05 LAB — CBC WITH DIFFERENTIAL/PLATELET
BASOS ABS: 0 10*3/uL (ref 0–0.1)
Basophils Relative: 1 %
EOS ABS: 0.2 10*3/uL (ref 0–0.7)
EOS PCT: 3 %
HCT: 33 % — ABNORMAL LOW (ref 40.0–52.0)
HEMOGLOBIN: 11.5 g/dL — AB (ref 13.0–18.0)
LYMPHS ABS: 0.7 10*3/uL — AB (ref 1.0–3.6)
Lymphocytes Relative: 10 %
MCH: 30.3 pg (ref 26.0–34.0)
MCHC: 34.9 g/dL (ref 32.0–36.0)
MCV: 86.8 fL (ref 80.0–100.0)
Monocytes Absolute: 0.8 10*3/uL (ref 0.2–1.0)
Monocytes Relative: 11 %
NEUTROS PCT: 75 %
Neutro Abs: 5.2 10*3/uL (ref 1.4–6.5)
PLATELETS: 210 10*3/uL (ref 150–440)
RBC: 3.81 MIL/uL — ABNORMAL LOW (ref 4.40–5.90)
RDW: 16.3 % — ABNORMAL HIGH (ref 11.5–14.5)
WBC: 6.9 10*3/uL (ref 3.8–10.6)

## 2017-09-05 LAB — URINALYSIS, COMPLETE (UACMP) WITH MICROSCOPIC
BILIRUBIN URINE: NEGATIVE
Glucose, UA: 150 mg/dL — AB
KETONES UR: NEGATIVE mg/dL
Leukocytes, UA: NEGATIVE
Nitrite: NEGATIVE
PROTEIN: 30 mg/dL — AB
Specific Gravity, Urine: 1.019 (ref 1.005–1.030)
pH: 5 (ref 5.0–8.0)

## 2017-09-05 LAB — TROPONIN I: Troponin I: <0.03 ng/mL (ref <0.03)

## 2017-09-05 NOTE — ED Notes (Signed)
Patient transported to X-ray 

## 2017-09-05 NOTE — ED Provider Notes (Signed)
[facility] Emergency Department Provider Note       Time seen: ----------------------------------------- 9:58 AM on 09/05/2017 -----------------------------------------   I have reviewed the triage vital signs and the nursing notes.  HISTORY   Chief Complaint Altered Mental Status    HPI Joel Grant is a 84 y.o. male with a history of cancer, dementia, diabetes, pneumonia who presents to the ED for altered mental status.  Family states he had periods of time yesterday where he was not acting normally.  He was essentially nonverbal at one point where she asked him to say something and he shook his head he could not talk.  This morning she had to help get him out of bed.  He denies any complaints at this time and is verbal on arrival.  Family is concerned he may have had a stroke.  Past Medical History:  Diagnosis Date  . 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 Dementia   . Diabetes mellitus without complication ([place])   . Dyspnea   . Pneumonia   . Psoriasis   . Tuberculosis    per wife as a child    Patient Active Problem List   Diagnosis Date Noted  . Rash and nonspecific skin eruption 01/14/2017  . Mass of upper lobe of right lung 02/05/2016  . Sepsis ([place]) 01/28/2016  . Adenomatous colon polyp 12/09/2014  . Alzheimer's dementia 12/09/2014  . 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 Diabetes ([place]) 12/09/2014  . Glaucoma 12/09/2014  . Adult hypothyroidism 12/09/2014  . Adiposity 12/09/2014  . Arthritis, degenerative 12/09/2014  . Hypercholesterolemia without hypertriglyceridemia 12/09/2014  . Avitaminosis D 12/09/2014    Past Surgical History:  Procedure Laterality Date  . CATARACT EXTRACTION      Allergies Ciprofloxacin; Hydrocodone-acetaminophen; Penicillins; and Sulfa antibiotics  Social History Social History   Tobacco Use  . Smoking status: Former Smoker    Last attempt to quit:  02/20/1976    Years since quitting: 41.5  . Smokeless tobacco: Never Used  Substance Use Topics  . Alcohol use: No  . Drug use: No   Review of Systems Constitutional: Negative for fever. Cardiovascular: Negative for chest pain. Respiratory: Negative for shortness of breath. Gastrointestinal: Negative for abdominal pain, vomiting and diarrhea. Musculoskeletal: Negative for back pain. Skin: Negative for rash. Neurological: Negative for headaches  All systems negative/normal/unremarkable except as stated in the HPI  ____________________________________________   PHYSICAL EXAM:  VITAL SIGNS: ED Triage Vitals  Enc Vitals Group     BP 09/05/17 0948 116/64     Pulse Rate 09/05/17 0948 86     Resp 09/05/17 0948 16     Temp 09/05/17 0948 98.8 F (37.1 C)     Temp Source 09/05/17 0948 Oral     SpO2 09/05/17 0948 97 %     Weight 09/05/17 0949 191 lb (86.6 kg)     Height 09/05/17 0949 5\' 6"  (1.676 m)     Head Circumference --      Peak Flow --      Pain Score --      Pain Loc --      Pain Edu? --      Excl. in [place]? --    Constitutional: Alert but disoriented, well appearing and in no distress. Eyes: Conjunctivae are normal. Normal extraocular movements. ENT   Head: Normocephalic and atraumatic.   Nose: No congestion/rhinnorhea.   Mouth/Throat: Mucous membranes are moist.  Neck: No stridor. Cardiovascular: Normal rate, regular rhythm. No murmurs, rubs, or gallops. Respiratory: Normal respiratory effort without tachypnea nor retractions. Breath sounds are clear and equal bilaterally. No wheezes/rales/rhonchi. Gastrointestinal: Soft and nontender. Normal bowel sounds Musculoskeletal: Nontender with normal range of motion in extremities. No lower extremity tenderness nor edema. Neurologic:  Normal speech and language. No gross focal neurologic deficits are appreciated.  Patient is able to ambulate without any difficulty Skin:  Skin is warm, dry and intact. No rash  noted. Psychiatric: Mood and affect are normal. Speech and behavior are normal.  ____________________________________________  ED COURSE:  As part of my medical decision making, I reviewed the following data within the [place] History obtained from family if available, nursing notes, old chart and ekg, as well as notes from prior ED visits. Patient presented for altered mental status, we will assess with labs and imaging as indicated at this time.   Procedures ____________________________________________   LABS (pertinent positives/negatives)  Labs Reviewed  CBC WITH DIFFERENTIAL/PLATELET - Abnormal; Notable for the following components:      Result Value   RBC 3.81 (*)    Hemoglobin 11.5 (*)    HCT 33.0 (*)    RDW 16.3 (*)    Lymphs Abs 0.7 (*)    All other components within normal limits  COMPREHENSIVE METABOLIC PANEL - Abnormal; Notable for the following components:   Sodium 131 (*)    CO2 21 (*)    Glucose, Bld 238 (*)    BUN 26 (*)    Creatinine, Ser 1.46 (*)    Calcium 8.5 (*)    GFR calc non Af Amer 41 (*)    GFR calc Af Amer 48 (*)    All other components within normal limits  TROPONIN I  URINALYSIS, COMPLETE (UACMP) WITH MICROSCOPIC    RADIOLOGY Images were viewed by me  CT head, chest x-ray IMPRESSION: Chronic atrophic and ischemic changes without acute abnormality. IMPRESSION: Likely stable right upper lobe mass.  Cardiomegaly with vascular congestion and slight interstitial prominence, early interstitial edema versus chronic lung disease.  ____________________________________________  DIFFERENTIAL DIAGNOSIS   CVA, metastasis, dehydration, electrolyte ab normality, occult infection  FINAL ASSESSMENT AND PLAN  Dementia   Plan: The patient had presented for altered mental status. Patient's labs revealed a mild increase in his creatinine compared to prior. Patient's imaging did not reveal any acute process.  I encouraged the  wife with the news that his imaging looks unchanged.  Overall there are no acute findings.  He could not give [place] a urine sample, I advised for the wife to obtain a sample and send it to his doctor for analysis.  At this point his symptoms are likely related to dementia.  He is cleared for outpatient follow-up.   Laurence Aly, MD   Note: This note was generated in part or whole with voice recognition software. Voice recognition is usually quite accurate but there are transcription errors that can and very often do occur. I apologize for any typographical errors that were not detected and corrected.     Earleen Newport, MD 09/05/17 1220

## 2017-09-05 NOTE — Telephone Encounter (Signed)
Wife called this morning to say that Mr Raether was not acting his normal.  States that yesterday he would not speak to her all day until last night and all he would say is her name Dot.  She said this morning she had trouble getting him to get out of bed which is unlike him.  She could not get him to eat, he is disoriented and he is just sitting at the table with his head down.  She was instructed to have him seen and she is going to take him to the ER for evaluation.

## 2017-09-05 NOTE — ED Triage Notes (Signed)
Per pt wife, pt began acting different yesterday, not wanting to talk, this morning she had to get him up out of bed., the pt is alert but non verbal, states he has a hx of dementia but is concerned about a stroke.

## 2017-09-09 ENCOUNTER — Encounter: Payer: Self-pay | Admitting: Family Medicine

## 2017-09-09 ENCOUNTER — Ambulatory Visit (INDEPENDENT_AMBULATORY_CARE_PROVIDER_SITE_OTHER): Payer: Medicare Other | Admitting: Family Medicine

## 2017-09-09 VITALS — BP 122/62 | HR 88 | Temp 98.6°F | Resp 16 | Wt 195.0 lb

## 2017-09-09 DIAGNOSIS — E119 Type 2 diabetes mellitus without complications: Secondary | ICD-10-CM

## 2017-09-09 DIAGNOSIS — G308 Other Alzheimer's disease: Secondary | ICD-10-CM

## 2017-09-09 DIAGNOSIS — F0280 Dementia in other diseases classified elsewhere without behavioral disturbance: Secondary | ICD-10-CM | POA: Diagnosis not present

## 2017-09-09 LAB — POCT GLYCOSYLATED HEMOGLOBIN (HGB A1C): HEMOGLOBIN A1C: 8.4 % — AB (ref 4.0–5.6)

## 2017-09-09 NOTE — Progress Notes (Signed)
Patient: Joel Grant Male    DOB: [birth year]   84 y.o.   MRN: [id number] Visit Date: 09/09/2017  Today's Provider: Wilhemena Durie, MD   Chief Complaint  Patient presents with  . Diabetes  . Hospitalization Follow-up   Subjective:    HPI  Diabetes Mellitus Type II, Follow-up:   Lab Results  Component Value Date   HGBA1C 8.9 (H) 05/14/2017   HGBA1C 8.3 01/07/2017   HGBA1C 8.7 09/17/2016    Last seen for diabetes 4 months ago.  Management since then includes none. He reports good compliance with treatment. He is not having side effects.  Home blood sugar records: below 200. around 160  Episodes of hypoglycemia? no   Current Insulin Regimen: n/a Current exercise: none  Pertinent Labs:    Component Value Date/Time   CHOL 159 05/14/2017 0830   TRIG 248 (H) 05/14/2017 0830   HDL 32 (L) 05/14/2017 0830   LDLCALC 77 05/14/2017 0830   CREATININE 1.46 (H) 09/05/2017 1013    Wt Readings from Last 3 Encounters:  09/09/17 195 lb (88.5 kg)  09/05/17 191 lb (86.6 kg)  06/17/17 197 lb 1.5 oz (89.4 kg)    ------------------------------------------------------------------------   Pt was seen in the ER for altered mental status. Labs reveals a mild increase in his creatine compared to prior. Patients imaging did not reveal any acute process. symptoms likely related to dementia. Pt wife reports that his dementia is worse. He does not remember how to take a bath. Wife reports that it is getting difficult for her to take care of him alone particularly with his bathing. She is getting very tired.      Allergies  Allergen Reactions  . Ciprofloxacin Anaphylaxis  . Hydrocodone-Acetaminophen Nausea And Vomiting  . Penicillins     rash  . Sulfa Antibiotics      Current Outpatient Medications:  .  CVS LANCETS ULTRA-THIN 30G MISC, USE 1 LANCET TO CHECK SUGAR TWICE A DAY, Disp: 200 each, Rfl: 12 .  metFORMIN (GLUCOPHAGE) 1000 MG tablet, TAKE 1 TABLET (1,000 MG  TOTAL) BY MOUTH DAILY AFTER SUPPER., Disp: 90 tablet, Rfl: 3  Review of Systems  Constitutional: Negative.   HENT: Negative.   Eyes: Negative.   Respiratory: Negative.   Cardiovascular: Negative.   Gastrointestinal: Negative.   Endocrine: Negative.   Genitourinary: Negative.   Musculoskeletal: Negative.   Skin: Negative.   Allergic/Immunologic: Negative.   Neurological: Negative.   Hematological: Negative.   Psychiatric/Behavioral: Positive for confusion.    Social History   Tobacco Use  . Smoking status: Former Smoker    Last attempt to quit: 02/20/1976    Years since quitting: 41.5  . Smokeless tobacco: Never Used  Substance Use Topics  . Alcohol use: No   Objective:   BP 122/62 (BP Location: Left Arm, Patient Position: Sitting, Cuff Size: Normal)   Pulse 88   Temp 98.6 F (37 C) (Oral)   Resp 16   Wt 195 lb (88.5 kg)   BMI 31.47 kg/m  Vitals:   09/09/17 0903  BP: 122/62  Pulse: 88  Resp: 16  Temp: 98.6 F (37 C)  TempSrc: Oral  Weight: 195 lb (88.5 kg)     Physical Exam  Constitutional: He appears well-developed and well-nourished.  HENT:  Head: Normocephalic and atraumatic.  Right Ear: External ear normal.  Left Ear: External ear normal.  Nose: Nose normal.  Eyes: Conjunctivae are normal. No scleral icterus.  Neck:  No thyromegaly present.  Cardiovascular: Normal rate, regular rhythm and normal heart sounds.  Pulmonary/Chest: Effort normal and breath sounds normal.  Abdominal: Soft.  Musculoskeletal: He exhibits no edema.  Neurological: He is alert.  Skin: Skin is warm and dry.  Psychiatric: He has a normal mood and affect. His behavior is normal. Judgment and thought content normal.        Assessment & Plan:     1. Type 2 diabetes mellitus without complication, without long-term current use of insulin (HCC) Try to get  A little lower but due to dementia will just follow for now.More than 50% of 25 minute visit spent in counseling regarding  issues. - POCT HgB A1C--8.4.  2. Alzheimer's disease of other onset without behavioral disturbance Wife needs help with care. Told wife to take car keys away from him.RTC 3-4 months. - Ambulatory referral to Social Work - Ambulatory referral to [place]      I have done the exam and reviewed the above chart and it is accurate to the best of my knowledge. Development worker, community has been used in this note in any air is in the dictation or transcription are unintentional.  Wilhemena Durie, MD  [place]

## 2017-09-16 ENCOUNTER — Other Ambulatory Visit: Payer: Self-pay | Admitting: Family Medicine

## 2017-09-16 DIAGNOSIS — E119 Type 2 diabetes mellitus without complications: Secondary | ICD-10-CM

## 2017-09-26 ENCOUNTER — Telehealth: Payer: Self-pay | Admitting: Family Medicine

## 2017-09-26 NOTE — Telephone Encounter (Signed)
FYI--Per [place] from [facility] pt's wife denied referral to home health care

## 2017-10-14 ENCOUNTER — Encounter: Payer: Self-pay | Admitting: Radiation Oncology

## 2017-10-14 ENCOUNTER — Ambulatory Visit
Admission: RE | Admit: 2017-10-14 | Discharge: 2017-10-14 | Disposition: A | Discharge status: Home / Self Care | Payer: Medicare Other | Source: Ambulatory Visit | Attending: Radiation Oncology | Admitting: Radiation Oncology

## 2017-10-14 ENCOUNTER — Other Ambulatory Visit: Payer: Self-pay | Admitting: *Deleted

## 2017-10-14 ENCOUNTER — Other Ambulatory Visit: Payer: Self-pay

## 2017-10-14 VITALS — BP 126/73 | HR 87 | Resp 18 | Wt 194.1 lb

## 2017-10-14 DIAGNOSIS — F0390 Unspecified dementia without behavioral disturbance: Secondary | ICD-10-CM | POA: Diagnosis not present

## 2017-10-14 DIAGNOSIS — Z923 Personal history of irradiation: Secondary | ICD-10-CM | POA: Diagnosis not present

## 2017-10-14 DIAGNOSIS — C3411 Malignant neoplasm of upper lobe, right bronchus or lung: Secondary | ICD-10-CM | POA: Diagnosis not present

## 2017-10-14 DIAGNOSIS — R918 Other nonspecific abnormal finding of lung field: Secondary | ICD-10-CM

## 2017-10-14 NOTE — Progress Notes (Signed)
Radiation Oncology Follow up Note  Name: Joel Grant   Date:   10/14/2017 MRN:  [id number] DOB: 06-02-1926    This 84 y.o. male presents to the clinic today for one-month follow-up status post.radiation therapy for stage IIIa non-small cell lung cancer of the right upper lobe in patient with early dementia  REFERRING PROVIDER: Jerrol Banana.,*  HPI: patient is an 84 year old male with early dementia now out 1 month having completed high dose radiation to his right upper lobe for stage IIIa non-small cell lung cancer. Seen today in routine follow-up he is doing well. He is without complaint. He specifically denies cough hemoptysis any dysphagia or skin reaction..  COMPLICATIONS OF TREATMENT: none  FOLLOW UP COMPLIANCE: keeps appointments   PHYSICAL EXAM:  BP 126/73   Pulse 87   Resp 18   Wt 194 lb 1.8 oz (88 kg)   BMI 31.33 kg/m  Well-developed well-nourished patient in NAD. HEENT reveals PERLA, EOMI, discs not visualized.  Oral cavity is clear. No oral mucosal lesions are identified. Neck is clear without evidence of cervical or supraclavicular adenopathy. Lungs are clear to A&P. Cardiac examination is essentially unremarkable with regular rate and rhythm without murmur rub or thrill. Abdomen is benign with no organomegaly or masses noted. Motor sensory and DTR levels are equal and symmetric in the upper and lower extremities. Cranial nerves II through XII are grossly intact. Proprioception is intact. No peripheral adenopathy or edema is identified. No motor or sensory levels are noted. Crude visual fields are within normal range.  RADIOLOGY RESULTS: no current films for reviewCT scan of the chest has been ordered for early September  PLAN: present time patient is doing well very little side effect profile from his previous radiation. I am please was overall progress. He has a follow-up in mid September with Dr. Harrington Challenger ordered a CT scan about a week before for him to have  for review. I have asked to see him back in 3-4 months for follow-up. Wife knows to call with any concerns at any time.  I would like to take this opportunity to thank you for allowing me to participate in the care of your patient.Noreene Filbert, MD

## 2017-11-27 IMAGING — DX DG CHEST 1V PORT
1 series · 1 of 1 positions shown · non-contrast
Comparison: None.

CLINICAL DATA: Fever, cold for past 2 weeks, nasal drainage,
vomiting.

EXAM:
PORTABLE CHEST 1 VIEW

[chest ap]
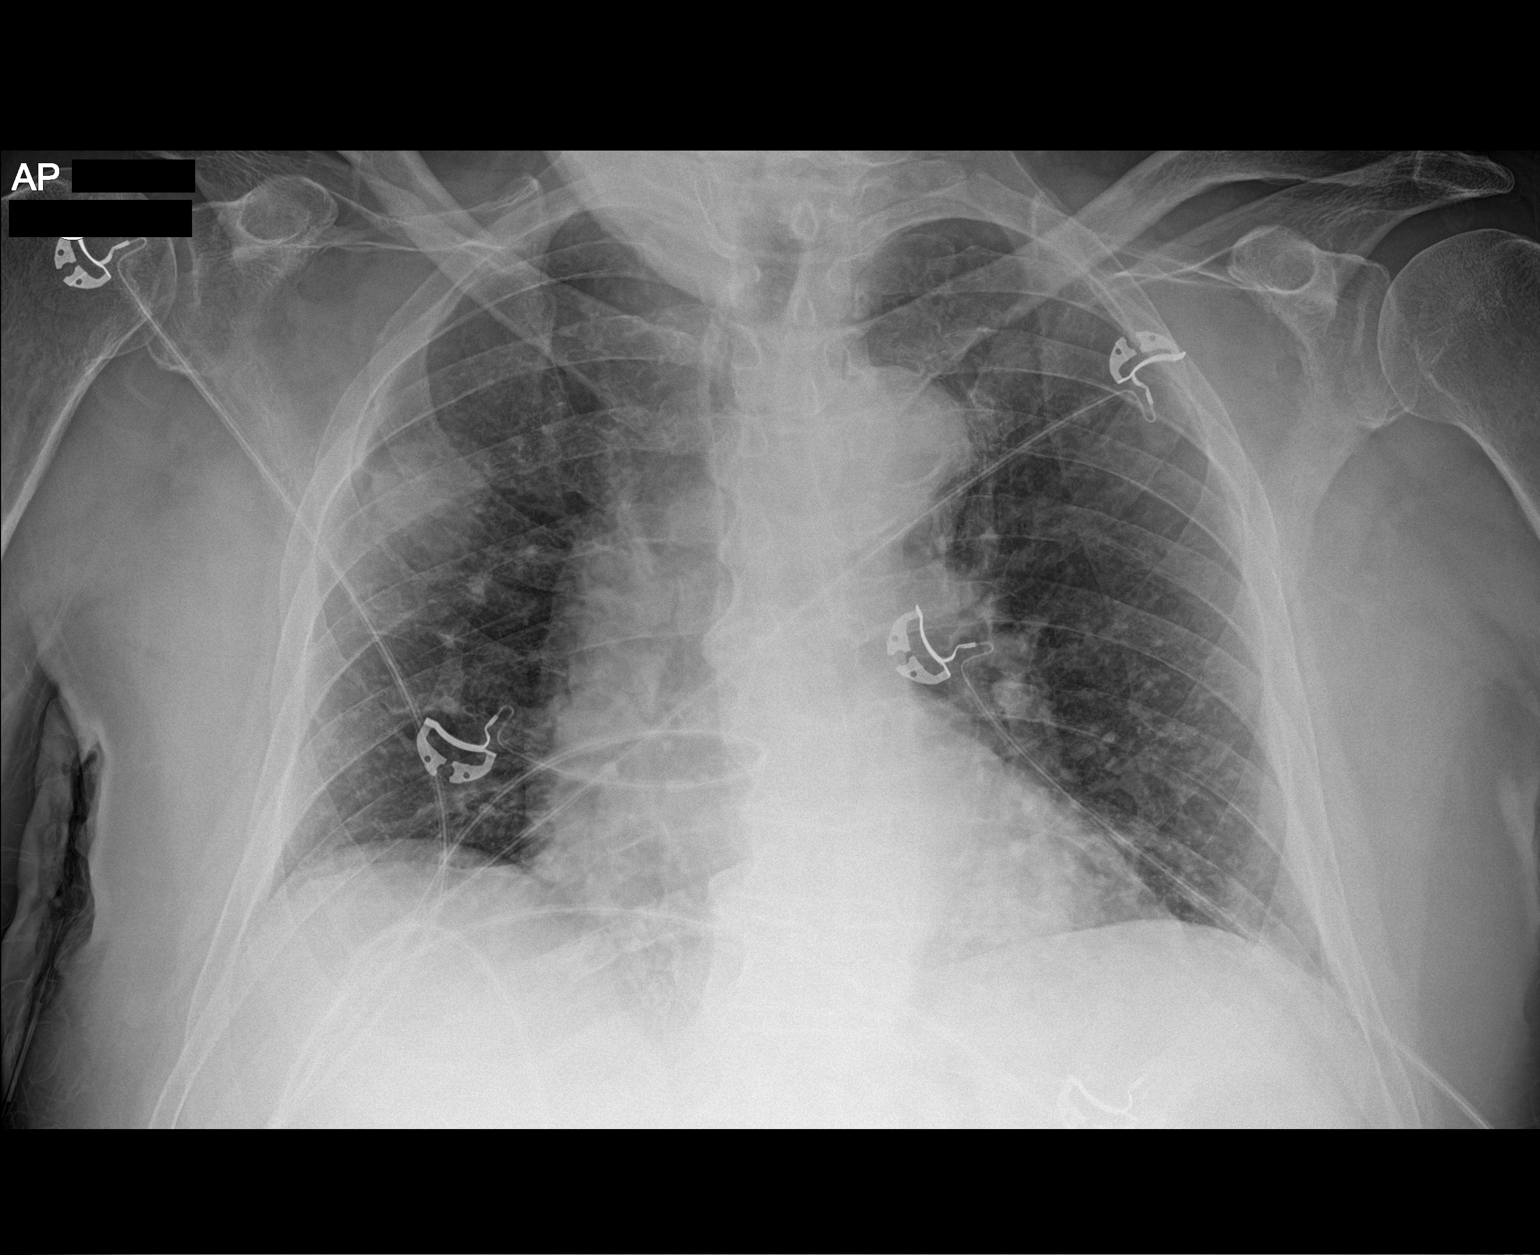

[1 of 1 positions shown; findings below may reference images not displayed]

FINDINGS: There is a masslike opacity in the right upper lung measuring 3.4 x
3.2 cm. Lungs otherwise clear. No pleural effusion or pneumothorax
seen.

Mild cardiomegaly. Atherosclerotic changes noted at the aortic arch
and there is expected age related aortic ectasia. Osseous structures
about the chest are unremarkable.
IMPRESSION: 1. Masslike opacity in the right upper lung measuring 3.4 x 3.2 cm.
This could be a rounded pneumonia but is more likely a neoplastic
mass. Recommend chest CT for further characterization.
2. Mild cardiomegaly.
3. Aortic atherosclerosis.

## 2017-11-27 IMAGING — CT CT CHEST W/O CM
2 of 3 series · 15 of 36 positions shown, 18 images · non-contrast
Comparison: Chest radiographs dated 01/20/2016

CLINICAL DATA: Right upper lobe mass on chest radiograph, cough,
cold, fever

EXAM:
CT CHEST WITHOUT CONTRAST
TECHNIQUE: Multidetector CT imaging of the chest was performed following the
standard protocol without IV contrast.

[Series 2: thorax · axial · 0.72mm/px · z∈[-163,+89]mm · 12 of 148 slices shown, 15 images]
[im 11/148  mediastinal]
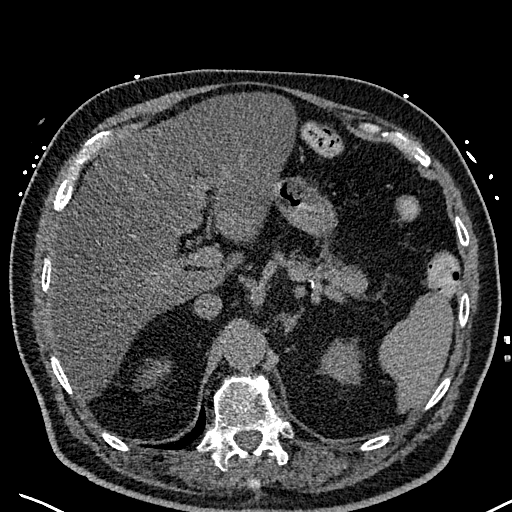
[im 11/148  lung]
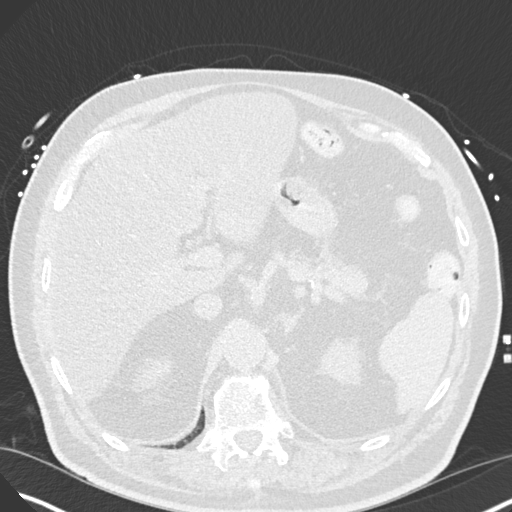
[im 22/148  lung]
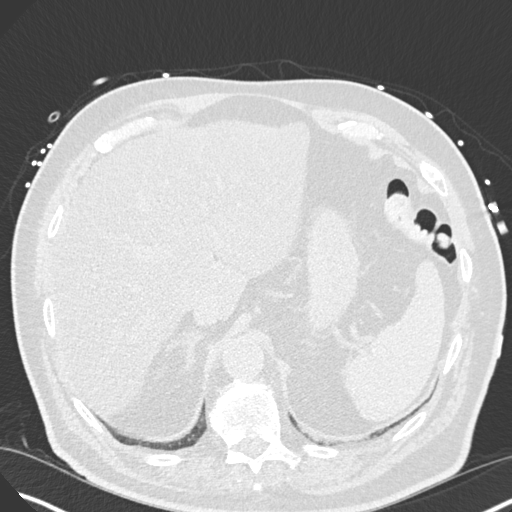
[im 33/148  lung]
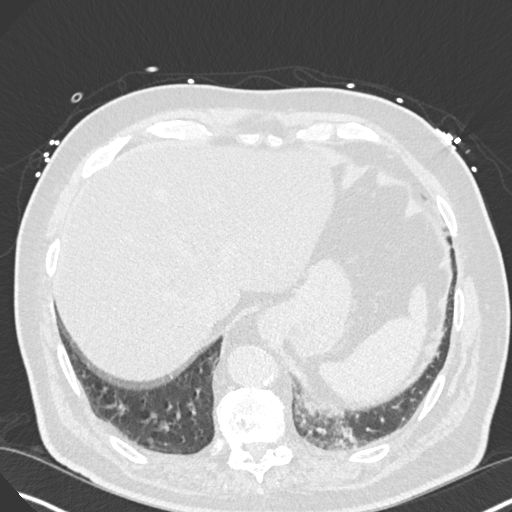
[im 44/148  lung]
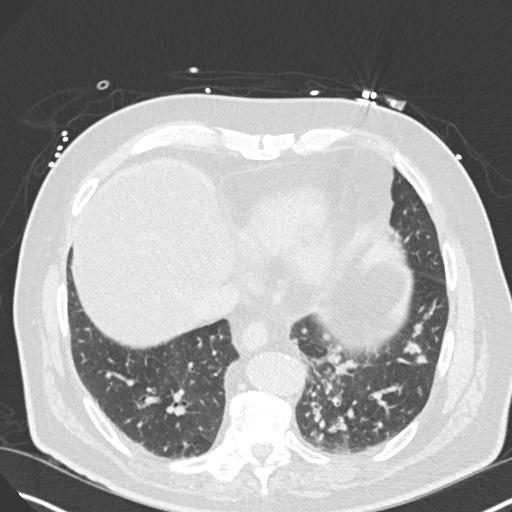
[im 55/148  mediastinal]
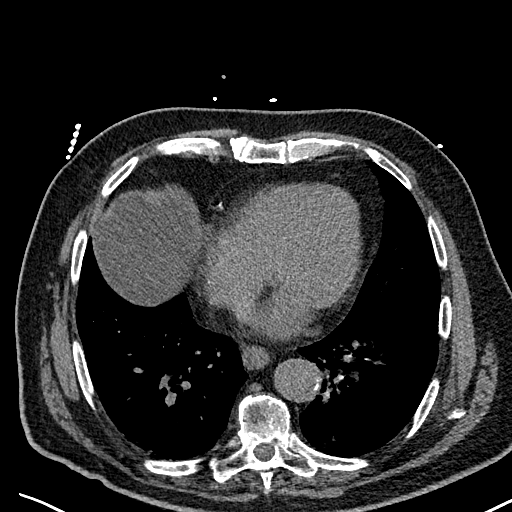
[im 55/148  lung]
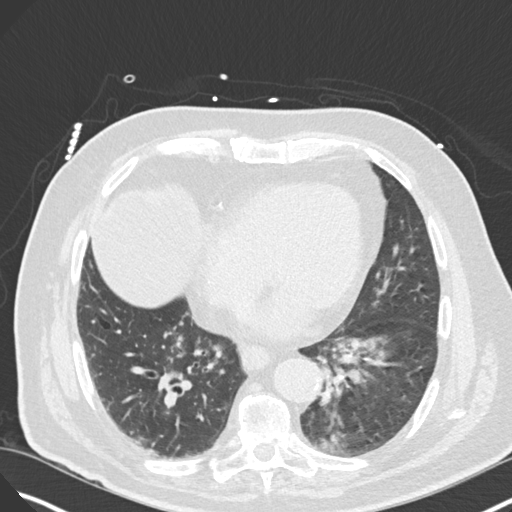
[im 66/148  lung]
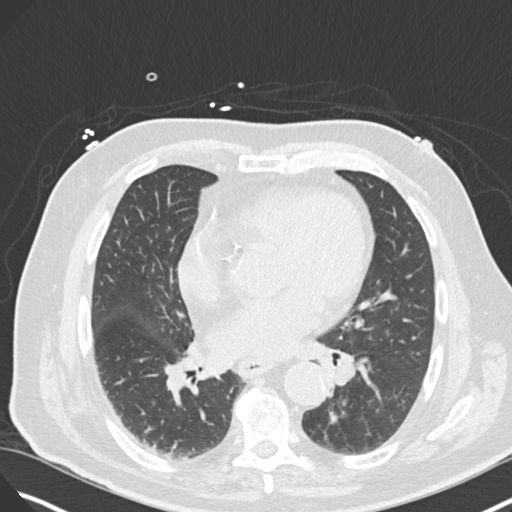
[im 82/148  lung]
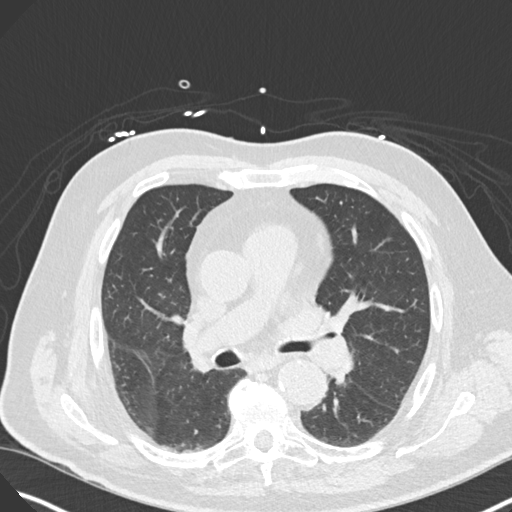
[im 93/148  lung]
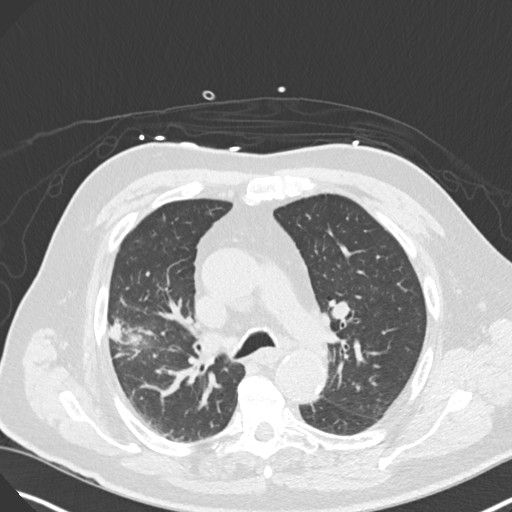
[im 104/148  mediastinal]
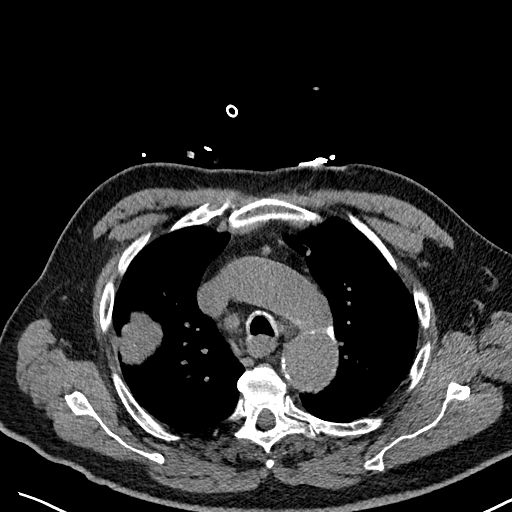
[im 104/148  lung]
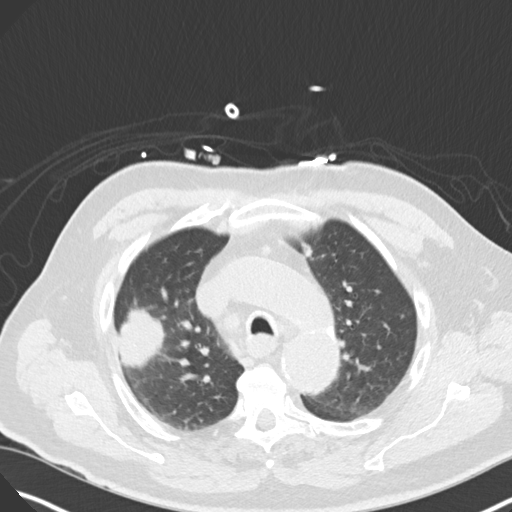
[im 115/148  lung]
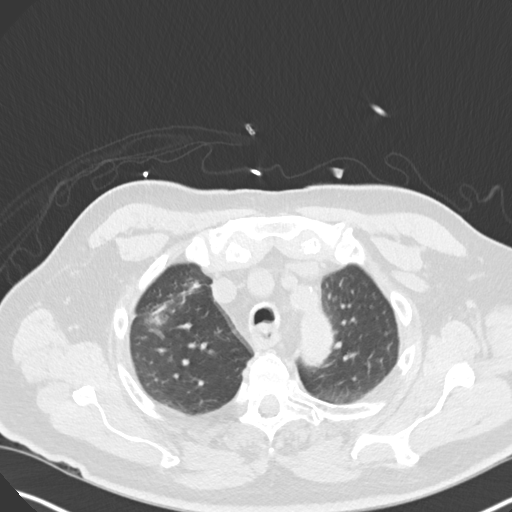
[im 126/148  lung]
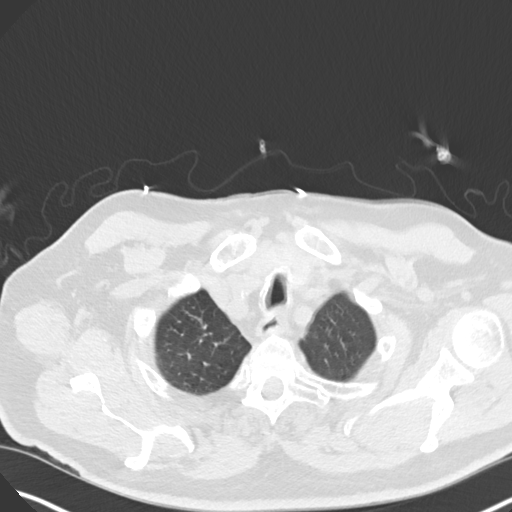
[im 137/148  lung]
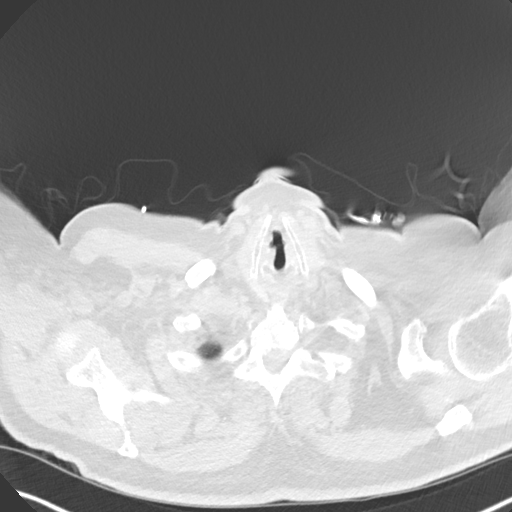

[Series 5: coronal · coronal · 0.60mm/px · 3 of 161 slices shown]
[im 33/161  lung]
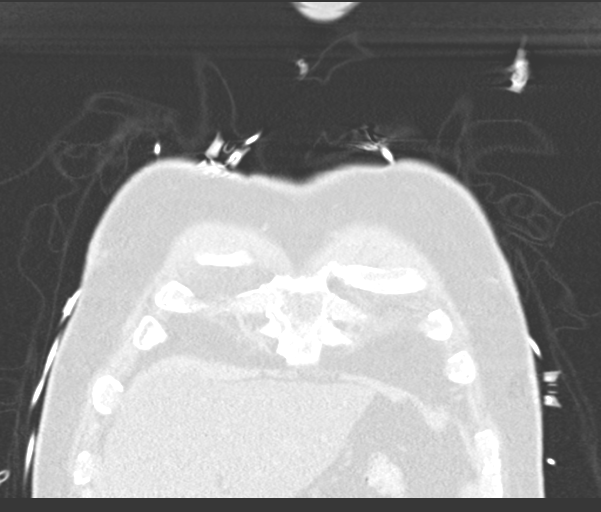
[im 65/161  lung]
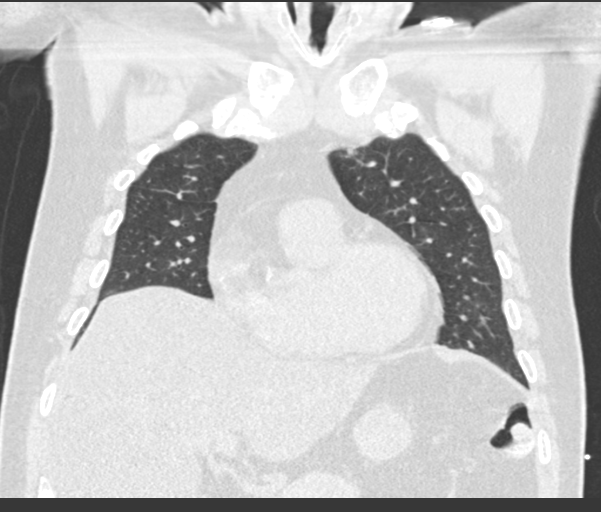
[im 97/161  lung]
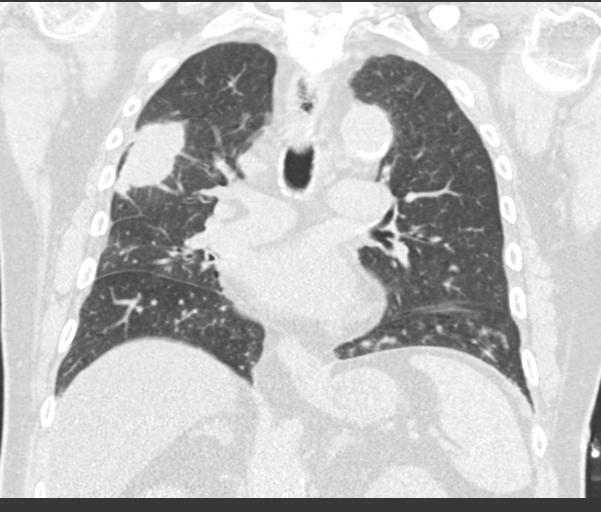

[15 of 36 positions shown; findings below may reference images not displayed]

FINDINGS: Cardiovascular: Mild cardiomegaly.  No pericardial effusion.

Coronary atherosclerosis in the LAD and right coronary artery.

Atherosclerotic calcifications of the aortic arch.

Mediastinum/Nodes: Small thoracic lymph nodes, including a 12 mm
short axis low right paratracheal node (series 2/ image 46) and a 10
mm short axis right hilar node (series 2/ image 63).

Visualized thyroid is unremarkable.

Lungs/Pleura: 4.0 x 3.2 x 4.3 cm lateral right upper lobe mass
(series 3/ image 46). Associated pleural retraction (series 3/ image
41).

Tree-in-bud nodularity in the left lower lobe (series 3/image 103),
suspicious for pneumonia. Metastatic disease is considered unlikely
given the appearance, particularly at the medial left lung base
(series 3/image 112).

Small left upper lobe pulmonary nodules measuring up to 8 mm in the
medial left upper lobe (series 3/ image 46), indeterminate for
infection versus contralateral metastasis.

No pleural effusion or pneumothorax.

Upper Abdomen: Visualize upper abdomen is notable for hepatic
steatosis and vascular calcifications.

Musculoskeletal: Degenerative changes of the visualized
thoracolumbar spine.
IMPRESSION: 4.3 cm lateral right upper lobe mass with associated pleural
retraction, highly suspicious for primary bronchogenic neoplasm.
Percutaneous sampling is suggested.

Nodularity in the left lower lobe, suspicious for pneumonia.
Additional nodularity in the left upper lobe measuring up to 8 mm,
indeterminate. Attention on follow-up is suggested.

Small mediastinal and right hilar lymph nodes, suspicious for nodal
metastases.

## 2017-12-05 ENCOUNTER — Ambulatory Visit
Admission: RE | Admit: 2017-12-05 | Discharge: 2017-12-05 | Disposition: A | Discharge status: Home / Self Care | Payer: Medicare Other | Source: Ambulatory Visit | Attending: Radiation Oncology | Admitting: Radiation Oncology

## 2017-12-05 DIAGNOSIS — J439 Emphysema, unspecified: Secondary | ICD-10-CM | POA: Insufficient documentation

## 2017-12-05 DIAGNOSIS — C3411 Malignant neoplasm of upper lobe, right bronchus or lung: Secondary | ICD-10-CM | POA: Diagnosis not present

## 2017-12-05 DIAGNOSIS — R918 Other nonspecific abnormal finding of lung field: Secondary | ICD-10-CM

## 2017-12-05 DIAGNOSIS — I700 Atherosclerosis of aorta: Secondary | ICD-10-CM | POA: Diagnosis not present

## 2017-12-05 LAB — POCT I-STAT CREATININE: CREATININE: 1.4 mg/dL — AB (ref 0.61–1.24)

## 2017-12-05 MED ORDER — IOHEXOL 300 MG/ML  SOLN
60.0000 mL | Freq: Once | INTRAMUSCULAR | Status: AC | PRN
  Administered 2017-12-05: 60 mL via INTRAVENOUS

## 2017-12-09 NOTE — Progress Notes (Signed)
[place]  [phone] [phone]  ID: Joel Grant OB: 1926-07-11  MR#: [id number]  IOX#:[id number]  Patient Care Team: Jerrol Banana., MD as PCP - General (Family Medicine) Lloyd Huger, MD as Consulting Physician (Oncology)  CHIEF COMPLAINT: Right upper lobe lung mass.  INTERVAL HISTORY: Patient returns to clinic today for routine evaluation and discussion of his imaging results.  Although he was unable to have a biopsy, given the continued growth of his lesion patient underwent XRT which she completed several months ago. Given his underlying dementia, the entire history is given by his wife and son. Currently, patient feels well and is at his baseline. He has no neurologic complaints. He denies any recent fevers. He has a good appetite and denies weight loss. He denies any chest pain, shortness of breath, cough, or hemoptysis.  He has no nausea, vomiting, constipation, or diarrhea. He has no urinary complaints.  Patient offers no specific complaints today.  REVIEW OF SYSTEMS:   Review of Systems  Constitutional: Negative.  Negative for fever, malaise/fatigue and weight loss.  Respiratory: Negative.  Negative for cough, hemoptysis, sputum production and shortness of breath.   Cardiovascular: Negative.  Negative for chest pain and leg swelling.  Gastrointestinal: Negative.  Negative for abdominal pain.  Genitourinary: Negative.  Negative for dysuria.  Musculoskeletal: Negative.  Negative for back pain.  Skin: Negative.  Negative for rash.  Neurological: Negative.  Negative for focal weakness, weakness and headaches.  Psychiatric/Behavioral: Positive for memory loss. The patient is not nervous/anxious.     As per HPI. Otherwise, a complete review of systems is negative.  PAST MEDICAL HISTORY: Past Medical History:  Diagnosis Date  . 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 Dementia   .  Diabetes mellitus without complication ([place] [facility])   . Dyspnea   . Pneumonia   . Psoriasis   . Tuberculosis    per wife as a child    PAST SURGICAL HISTORY: Past Surgical History:  Procedure Laterality Date  . CATARACT EXTRACTION      FAMILY HISTORY: Family History  Problem Relation Age of Onset  . Heart disease Mother   . Dementia Mother   . Diabetes Father   . Heart disease Father   . Peripheral vascular disease Brother   . Diabetes Brother     ADVANCED DIRECTIVES (Y/N):  N  HEALTH MAINTENANCE: Social History   Tobacco Use  . Smoking status: Former Smoker    Last attempt to quit: 02/20/1976    Years since quitting: 41.8  . Smokeless tobacco: Never Used  Substance Use Topics  . Alcohol use: No  . Drug use: No     Colonoscopy:  PAP:  Bone density:  Lipid panel:  Allergies  Allergen Reactions  . Ciprofloxacin Anaphylaxis  . Hydrocodone-Acetaminophen Nausea And Vomiting  . Penicillins     rash  . Sulfa Antibiotics     Current Outpatient Medications  Medication Sig Dispense Refill  . [employer] LANCETS ULTRA-THIN 30G MISC USE 1 LANCET TO CHECK SUGAR TWICE A DAY 200 each 12  . metFORMIN (GLUCOPHAGE) 1000 MG tablet TAKE 1 TABLET (1,000 MG TOTAL) BY MOUTH DAILY AFTER SUPPER. 90 tablet 3   No current facility-administered medications for this visit.     OBJECTIVE: Vitals:   12/12/17 1035  BP: 126/74  Pulse: 80  Resp: 18  Temp: 98.5 F (36.9 C)  SpO2: 96%  Body mass index is 32.15 kg/m.    ECOG FS:1 - Symptomatic but completely ambulatory  General: Well-developed, well-nourished, no acute distress. Eyes: Pink conjunctiva, anicteric sclera. HEENT: Normocephalic, moist mucous membranes. Lungs: Clear to auscultation bilaterally. Heart: Regular rate and rhythm. No rubs, murmurs, or gallops. Abdomen: Soft, nontender, nondistended. No organomegaly noted, normoactive bowel sounds. Musculoskeletal: No edema, cyanosis, or clubbing. Neuro: Alert, confused. Cranial  nerves grossly intact. Skin: No rashes or petechiae noted. Psych: Normal affect.   LAB RESULTS:  Lab Results  Component Value Date   NA 131 (L) 09/05/2017   K 4.1 09/05/2017   CL 101 09/05/2017   CO2 21 (L) 09/05/2017   GLUCOSE 238 (H) 09/05/2017   BUN 26 (H) 09/05/2017   CREATININE 1.40 (H) 12/05/2017   CALCIUM 8.5 (L) 09/05/2017   PROT 6.5 09/05/2017   ALBUMIN 3.5 09/05/2017   AST 25 09/05/2017   ALT 21 09/05/2017   ALKPHOS 43 09/05/2017   BILITOT 0.5 09/05/2017   GFRNONAA 41 (L) 09/05/2017   GFRAA 48 (L) 09/05/2017    Lab Results  Component Value Date   WBC 6.9 09/05/2017   NEUTROABS 5.2 09/05/2017   HGB 11.5 (L) 09/05/2017   HCT 33.0 (L) 09/05/2017   MCV 86.8 09/05/2017   PLT 210 09/05/2017     STUDIES: Ct Chest W Contrast  Result Date: 12/06/2017 CLINICAL DATA:  Right upper lobe lung cancer, status post radiation, dementia EXAM: CT CHEST WITH CONTRAST TECHNIQUE: Multidetector CT imaging of the chest was performed during intravenous contrast administration. CONTRAST:  18mL OMNIPAQUE IOHEXOL 300 MG/ML  SOLN COMPARISON:  PET-CT dated 06/24/2017 FINDINGS: Cardiovascular: Heart is top-normal in size. No pericardial effusion. No evidence of thoracic aortic aneurysm. Atherosclerotic calcifications of the aortic arch. Coronary atherosclerosis of the LAD and right coronary artery. Mediastinum/Nodes: 10 mm short axis low right paratracheal node, previously 14 mm. 9 mm short axis right hilar node, previously 14 mm. Visualized thyroid is unremarkable. Lungs/Pleura: 3.0 x 5.7 cm right upper lobe mass (series 3/image 34), corresponding to known primary bronchogenic neoplasm, previously 3.9 x 6.2 cm. Surrounding mild radiation changes in the anterior and medial right upper lobe. Mild patchy/ground-glass opacity in thea left lower lobe (series 3/image 96), likely reflecting additional radiation changes. No focal consolidation. Mild paraseptal emphysematous changes. No pleural effusion or  pneumothorax. Upper Abdomen: Visualized upper abdomen is notable for hepatic steatosis. Musculoskeletal: Degenerative changes of the visualized thoracolumbar spine. IMPRESSION: 3.0 x 5.7 cm right upper lobe mass, corresponding to known primary bronchogenic neoplasm, mildly decreased. Surrounding radiation changes. Small mediastinal and right hilar nodes, mildly improved. Aortic Atherosclerosis (ICD10-I70.0) and Emphysema (ICD10-J43.9). Electronically Signed   By: Julian Hy M.D.   On: 12/06/2017 08:35    ASSESSMENT: Right upper lobe lung mass.  PLAN:    1. Right upper lobe lung mass: Although no biopsy was able to be performed, given the continued growth this is highly suspicious for underlying malignancy and patient underwent palliative XRT completing in June 2019.  CT scan results from December 06, 2017 reviewed independently and reported as above with decreased size of known lesion and surrounding radiation changes.  Previously hospice and end-of-life care were discussed, but the patient and family are not ready to make this decision.  No further intervention is needed.  Return to clinic in 6 months with repeat imaging and further evaluation.   2. Dementia: Patient appears to be at his baseline.  I spent a total of 20 minutes face-to-face with the patient of  which greater than 50% of the visit was spent in counseling and coordination of care as detailed above.   Patient expressed understanding and was in agreement with this plan. He also understands that He can call clinic at any time with any questions, concerns, or complaints.   Cancer Staging No matching staging information was found for the patient.  Lloyd Huger, MD   12/14/2017 9:31 AM

## 2017-12-12 ENCOUNTER — Inpatient Hospital Stay: Payer: Medicare Other | Attending: Oncology | Admitting: Oncology

## 2017-12-12 ENCOUNTER — Encounter: Payer: Self-pay | Admitting: Oncology

## 2017-12-12 VITALS — BP 126/74 | HR 80 | Temp 98.5°F | Resp 18 | Wt 199.2 lb

## 2017-12-12 DIAGNOSIS — Z923 Personal history of irradiation: Secondary | ICD-10-CM

## 2017-12-12 DIAGNOSIS — F0390 Unspecified dementia without behavioral disturbance: Secondary | ICD-10-CM | POA: Diagnosis not present

## 2017-12-12 DIAGNOSIS — R918 Other nonspecific abnormal finding of lung field: Secondary | ICD-10-CM

## 2017-12-12 NOTE — Progress Notes (Signed)
Patient denies any concerns today.  

## 2018-01-09 ENCOUNTER — Encounter: Payer: Self-pay | Admitting: Family Medicine

## 2018-01-09 ENCOUNTER — Ambulatory Visit (INDEPENDENT_AMBULATORY_CARE_PROVIDER_SITE_OTHER): Payer: Medicare Other | Admitting: Family Medicine

## 2018-01-09 VITALS — BP 116/66 | HR 64 | Temp 97.9°F | Resp 16 | Wt 200.0 lb

## 2018-01-09 DIAGNOSIS — F0280 Dementia in other diseases classified elsewhere without behavioral disturbance: Secondary | ICD-10-CM | POA: Diagnosis not present

## 2018-01-09 DIAGNOSIS — R918 Other nonspecific abnormal finding of lung field: Secondary | ICD-10-CM | POA: Diagnosis not present

## 2018-01-09 DIAGNOSIS — G301 Alzheimer's disease with late onset: Secondary | ICD-10-CM

## 2018-01-09 DIAGNOSIS — E119 Type 2 diabetes mellitus without complications: Secondary | ICD-10-CM | POA: Diagnosis not present

## 2018-01-09 DIAGNOSIS — Z23 Encounter for immunization: Secondary | ICD-10-CM | POA: Diagnosis not present

## 2018-01-09 LAB — POCT GLYCOSYLATED HEMOGLOBIN (HGB A1C): Hemoglobin A1C: 8.6 % — AB (ref 4.0–5.6)

## 2018-01-09 NOTE — Progress Notes (Signed)
Patient: Joel Grant Male    DOB: July 14, 1926   84 y.o.   MRN: [id number] Visit Date: 01/09/2018  Today's Provider: Wilhemena Durie, MD   Chief Complaint  Patient presents with  . Diabetes   Subjective:    Diabetes  He presents for his follow-up diabetic visit. He has type 2 diabetes mellitus. His disease course has been stable. Hypoglycemia symptoms include confusion. Pertinent negatives for hypoglycemia include no dizziness or headaches. There are no diabetic associated symptoms. Pertinent negatives for diabetes include no blurred vision, no chest pain, no fatigue, no foot paresthesias, no foot ulcerations, no polydipsia, no polyphagia, no polyuria, no visual change, no weakness and no weight loss. Symptoms are stable. He is compliant with treatment all of the time. There is no change (Pt's fasting blood sugars are mid to high 100's.) in his home blood glucose trend.      Allergies  Allergen Reactions  . Ciprofloxacin Anaphylaxis  . Hydrocodone-Acetaminophen Nausea And Vomiting  . Penicillins     rash  . Sulfa Antibiotics      Current Outpatient Medications:  .  metFORMIN (GLUCOPHAGE) 1000 MG tablet, TAKE 1 TABLET (1,000 MG TOTAL) BY MOUTH DAILY AFTER SUPPER., Disp: 90 tablet, Rfl: 3 .  CVS LANCETS ULTRA-THIN 30G MISC, USE 1 LANCET TO CHECK SUGAR TWICE A DAY (Patient not taking: Reported on 01/09/2018), Disp: 200 each, Rfl: 12  Review of Systems  Constitutional: Negative.  Negative for fatigue and weight loss.  HENT: Negative.   Eyes: Negative for blurred vision.  Respiratory: Negative.   Cardiovascular: Negative.  Negative for chest pain.  Gastrointestinal: Negative.   Endocrine: Negative for cold intolerance, heat intolerance, polydipsia, polyphagia and polyuria.  Musculoskeletal: Positive for back pain. Negative for arthralgias, gait problem, joint swelling, myalgias, neck pain and neck stiffness.  Allergic/Immunologic: Negative.   Neurological: Negative  for dizziness, weakness, light-headedness and headaches.  Psychiatric/Behavioral: Positive for confusion.    Social History   Tobacco Use  . Smoking status: Former Smoker    Last attempt to quit: 02/20/1976    Years since quitting: 41.9  . Smokeless tobacco: Never Used  Substance Use Topics  . Alcohol use: No   Objective:   BP 116/66 (BP Location: Right Arm, Patient Position: Sitting, Cuff Size: Large)   Pulse 64   Temp 97.9 F (36.6 C) (Oral)   Resp 16   Wt 200 lb (90.7 kg)   BMI 32.28 kg/m  Vitals:   01/09/18 1115  BP: 116/66  Pulse: 64  Resp: 16  Temp: 97.9 F (36.6 C)  TempSrc: Oral  Weight: 200 lb (90.7 kg)    Results for orders placed or performed in visit on 01/09/18  POCT glycosylated hemoglobin (Hb A1C)  Result Value Ref Range   Hemoglobin A1C 8.6 (A) 4.0 - 5.6 %      Physical Exam  Constitutional: He appears well-developed and well-nourished.  HENT:  Head: Normocephalic and atraumatic.  Right Ear: External ear normal.  Left Ear: External ear normal.  Nose: Nose normal.  Eyes: No scleral icterus.  Neck: No thyromegaly present.  Cardiovascular: Normal rate, regular rhythm and normal heart sounds.  Pulmonary/Chest: Effort normal and breath sounds normal.  Musculoskeletal: He exhibits no edema.  Neurological: He is alert.  Skin: Skin is warm.  Psychiatric: He has a normal mood and affect.        Assessment & Plan:      1. Type 2 diabetes mellitus without  complication, without long-term current use of insulin (HCC) Due to dementia goal less than 9 to avoid hypoglycemia. - POCT glycosylated hemoglobin (Hb A1C)--8.6 tod  2. Need for influenza vaccination  - Flu vaccine HIGH DOSE PF (Fluzone High dose)  3. Mass of upper lobe of right lung   4. Late onset Alzheimer's disease without behavioral disturbance (HCC) RTC 5-6 months.      I have done the exam and reviewed the above chart and it is accurate to the best of my knowledge. Risk manager has been used in this note in any air is in the dictation or transcription are unintentional.  Wilhemena Durie, MD  [place]

## 2018-02-10 ENCOUNTER — Other Ambulatory Visit: Payer: Self-pay

## 2018-02-10 ENCOUNTER — Encounter: Payer: Self-pay | Admitting: Radiation Oncology

## 2018-02-10 ENCOUNTER — Ambulatory Visit
Admission: RE | Admit: 2018-02-10 | Discharge: 2018-02-10 | Disposition: A | Discharge status: Home / Self Care | Payer: Medicare Other | Source: Ambulatory Visit | Attending: Radiation Oncology | Admitting: Radiation Oncology

## 2018-02-10 VITALS — BP 139/77 | HR 91 | Temp 97.7°F | Resp 18 | Wt 199.3 lb

## 2018-02-10 DIAGNOSIS — Z87891 Personal history of nicotine dependence: Secondary | ICD-10-CM | POA: Insufficient documentation

## 2018-02-10 DIAGNOSIS — R918 Other nonspecific abnormal finding of lung field: Secondary | ICD-10-CM | POA: Insufficient documentation

## 2018-02-10 DIAGNOSIS — Z923 Personal history of irradiation: Secondary | ICD-10-CM | POA: Diagnosis not present

## 2018-02-10 NOTE — Progress Notes (Signed)
Radiation Oncology Follow up Note  Name: Joel Grant   Date:   02/10/2018 MRN:  [id number] DOB: Oct 24, 1926    This 84 y.o. male presents to the clinic today for 5 month follow-up status post radiation therapy to his chest for stage IIIa non-small cell lung cancer of the right upper lobe.  REFERRING PROVIDER: Jerrol Banana.,*  HPI: patient is an 84 year old male with early dementia now out 5 months having completed radiation therapy to his right upper lobe for stage IIIa non-small cell lung cancer. Seen today in routine follow-up he is doing well. He specifically denies cough hemoptysis or chest tightness..he recently had back in September a CT scan of his chest showing residual 5.7 cm right upper lobe massmildly decreased in size compatible with stable chest.He had small mediastinal and right hilar nodes which are mildly improved.  COMPLICATIONS OF TREATMENT: none  FOLLOW UP COMPLIANCE: keeps appointments   PHYSICAL EXAM:  BP 139/77 (BP Location: Left Arm, Patient Position: Sitting)   Pulse 91   Temp 97.7 F (36.5 C) (Tympanic)   Resp 18   Wt 199 lb 4.7 oz (90.4 kg)   BMI 32.17 kg/m  Well-developed well-nourished patient in NAD. HEENT reveals PERLA, EOMI, discs not visualized.  Oral cavity is clear. No oral mucosal lesions are identified. Neck is clear without evidence of cervical or supraclavicular adenopathy. Lungs are clear to A&P. Cardiac examination is essentially unremarkable with regular rate and rhythm without murmur rub or thrill. Abdomen is benign with no organomegaly or masses noted. Motor sensory and DTR levels are equal and symmetric in the upper and lower extremities. Cranial nerves II through XII are grossly intact. Proprioception is intact. No peripheral adenopathy or edema is identified. No motor or sensory levels are noted. Crude visual fields are within normal range.  RADIOLOGY RESULTS: CT scan is reviewed and compatible with the above-stated findings  PLAN:  present time he is doing well. I have reviewed his CT scan which shows scarring in her area of previous high-dose radiation compatible with stable disease. I've asked to see him back in 6 months for follow-up. Will obtain a CT scan prior to that visit. Patient family know to call with any concerns.  I would like to take this opportunity to thank you for allowing me to participate in the care of your patient.Noreene Filbert, MD

## 2018-02-19 ENCOUNTER — Ambulatory Visit (INDEPENDENT_AMBULATORY_CARE_PROVIDER_SITE_OTHER): Payer: Medicare Other

## 2018-02-19 VITALS — BP 108/58 | HR 86 | Temp 98.8°F | Ht 67.0 in | Wt 202.2 lb

## 2018-02-19 DIAGNOSIS — Z0000 Encounter for general adult medical examination without abnormal findings: Secondary | ICD-10-CM

## 2018-02-19 NOTE — Progress Notes (Signed)
Subjective:   Joel Grant is a 84 y.o. male who presents for Medicare Annual/Subsequent preventive examination.  Review of Systems:  N/A  Cardiac Risk Factors include: advanced age (>51men, >76 women);male gender;obesity (BMI >30kg/m2);diabetes mellitus     Objective:    Vitals: BP (!) 108/58 (BP Location: Right Arm)   Pulse 86   Temp 98.8 F (37.1 C) (Oral)   Ht 5\' 7"  (1.702 m)   Wt 202 lb 3.2 oz (91.7 kg)   BMI 31.67 kg/m   Body mass index is 31.67 kg/m.  Advanced Directives 02/19/2018 02/10/2018 10/14/2017 06/17/2017 06/05/2017 02/18/2017 12/17/2016  Does Patient Have a Medical Advance Directive? Yes Yes Yes No Yes Yes Yes  Type of Advance Directive Living will Living will Living will Living will Living will Living will Living will;Healthcare Power of Attorney  Does patient want to make changes to medical advance directive? - No - Patient declined - - - - -  Copy of [place] in Chart? - No - copy requested No - copy requested No - copy requested - - -  Would patient like information on creating a medical advance directive? - - - No - Patient declined - - -    Tobacco Social History   Tobacco Use  Smoking Status Former Smoker  . Last attempt to quit: 02/20/1976  . Years since quitting: 42.0  Smokeless Tobacco Never Used     Counseling given: Not Answered   Clinical Intake:  Pre-visit preparation completed: Yes  Pain : No/denies pain Pain Score: 0-No pain    Diabetes:  Is the patient diabetic?  Yes , type 2 If diabetic, was a CBG obtained today?  No  Did the patient bring in their glucometer from home?  No  How often do you monitor your CBG's? Once daily.   Financial Strains and Diabetes Management:  Are you having any financial strains with the device, your supplies or your medication? No .  Does the patient want to be seen by Chronic Care Management for management of their diabetes?  No  Would the patient like to be referred to a  Nutritionist or for Diabetic Management?  No   Diabetic Exams:  Diabetic Eye Exam: Completed 09/22/15. Overdue for diabetic eye exam. Pt has been advised about the importance in completing this exam. Pt to schedule an apt with Dr Sandra Cockayne in the near future.   Diabetic Foot Exam: Completed 04/18/15. Pt has been advised about the importance in completing this exam. Note made to f/u on this at next OV.    Nutritional Status: BMI > 30  Obese Nutritional Risks: None   How often do you need to have someone help you when you read instructions, pamphlets, or other written materials from your doctor or pharmacy?: 1 - Never  Interpreter Needed?: No  Information entered by :: [facility], LPN  Past Medical History:  Diagnosis Date  . 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 Dementia ([place])   . Diabetes mellitus without complication ([place])   . Dyspnea   . Pneumonia   . Psoriasis   . Tuberculosis    per wife as a child   Past Surgical History:  Procedure Laterality Date  . CATARACT EXTRACTION     Family History  Problem Relation Age of Onset  . Heart disease Mother   . Dementia Mother   . Diabetes Father   . Heart disease Father   .  Peripheral vascular disease Brother   . Diabetes Brother    Social History   Socioeconomic History  . Marital status: Married    Spouse name: Not on file  . Number of children: 1  . Years of education: Not on file  . Highest education level: 7th grade  Occupational History  . Occupation: retired  Scientific laboratory technician  . Financial resource strain: Not hard at all  . Food insecurity:    Worry: Never true    Inability: Never true  . Transportation needs:    Medical: No    Non-medical: No  Tobacco Use  . Smoking status: Former Smoker    Last attempt to quit: 02/20/1976    Years since quitting: 42.0  . Smokeless tobacco: Never Used  Substance and Sexual Activity  . Alcohol use: No  . Drug use: No  . Sexual  activity: Not on file  Lifestyle  . Physical activity:    Days per week: 0 days    Minutes per session: 0 min  . Stress: Not at all  Relationships  . Social connections:    Talks on phone: Patient refused    Gets together: Patient refused    Attends religious service: Patient refused    Active member of club or organization: Patient refused    Attends meetings of clubs or organizations: Patient refused    Relationship status: Patient refused  Other Topics Concern  . Not on file  Social History Narrative  . Not on file    Outpatient Encounter Medications as of 02/19/2018  Medication Sig  . CVS LANCETS ULTRA-THIN 30G MISC USE 1 LANCET TO CHECK SUGAR TWICE A DAY  . metFORMIN (GLUCOPHAGE) 1000 MG tablet TAKE 1 TABLET (1,000 MG TOTAL) BY MOUTH DAILY AFTER SUPPER.   No facility-administered encounter medications on file as of 02/19/2018.     Activities of Daily Living In your present state of health, do you have any difficulty performing the following activities: 02/19/2018  Hearing? Y  Comment Does not wear hearing aids.   Vision? N  Comment Has eye glasses but does not wear them.  Difficulty concentrating or making decisions? Y  Comment Has dementia.   Walking or climbing stairs? Y  Comment Due to SOB.  Dressing or bathing? N  Doing errands, shopping? Y  Comment Does not drive.   Preparing Food and eating ? Y  Comment Wife cooks.   Using the Toilet? N  In the past six months, have you accidently leaked urine? N  Do you have problems with loss of bowel control? N  Managing your Medications? Y  Comment Wife manages medications.   Managing your Finances? Y  Comment Wife manages finances.   Housekeeping or managing your Housekeeping? Y  Comment Wife does housekeeping.   Some recent data might be hidden    Patient Care Team: Jerrol Banana., MD as PCP - General (Family Medicine) Lloyd Huger, MD as Consulting Physician (Oncology) Noreene Filbert, MD as  Referring Physician (Radiation Oncology) Estill Cotta, MD (Ophthalmology)   Assessment:   This is a routine wellness examination for Joel Grant.  Exercise Activities and Dietary recommendations Current Exercise Habits: The patient does not participate in regular exercise at present, Exercise limited by: orthopedic condition(s)  Goals    . DIET - INCREASE WATER INTAKE     Recommend increasing water intake to 4 glasses a day.     . Exercise 3x per week (30 min per time)  Recommend to exercise for 3 days a week for at least 30 minutes at a time.         Fall Risk Fall Risk  02/19/2018 10/14/2017 02/18/2017 02/16/2016 12/13/2014  Falls in the past year? 0 No No No No   FALL RISK PREVENTION PERTAINING TO THE HOME:  Any stairs in or around the home WITH handrails? No  Home free of loose throw rugs in walkways, pet beds, electrical cords, etc? Yes  Adequate lighting in your home to reduce risk of falls? Yes   ASSISTIVE DEVICES UTILIZED TO PREVENT FALLS:  Life alert? No  Use of a cane, walker or w/c? No  Grab bars in the bathroom? No  Shower chair or bench in shower? No  Elevated toilet seat or a handicapped toilet? Yes    TIMED UP AND GO:  Was the test performed? No .    Depression Screen PHQ 2/9 Scores 02/19/2018 10/14/2017 02/18/2017 02/16/2016  PHQ - 2 Score 0 0 0 0    Cognitive Function MMSE - Mini Mental State Exam 05/13/2017 09/17/2016  Orientation to time 0 1  Orientation to Place 5 2  Registration 1 1  Attention/ Calculation 0 0  Recall 0 0  Language- name 2 objects 1 1  Language- repeat 1 0  Language- follow 3 step command 3 3  Language- read & follow direction 1 1  Write a sentence 0 0  Copy design 0 1  Total score 12 10     6CIT Screen 06/04/2016 02/16/2016  What Year? 4 points 4 points  What month? 3 points 0 points  What time? 0 points 3 points  Count back from 20 0 points 4 points  Months in reverse 4 points 4 points  Repeat phrase 10 points 10  points  Total Score 21 25    Immunization History  Administered Date(s) Administered  . Influenza, High Dose Seasonal PF 12/13/2014, 12/16/2015, 01/07/2017, 01/09/2018  . Pneumococcal Conjugate-13 03/18/2014  . Pneumococcal Polysaccharide-23 01/14/2013  . Td 11/02/2002    Qualifies for Shingles Vaccine? Yes . Due for Shingrix. Education has been provided regarding the importance of this vaccine. Pt has been advised to call insurance company to determine out of pocket expense. Advised may also receive vaccine at local pharmacy or Health Dept. Verbalized acceptance and understanding.  Tdap: Up to date  Flu Vaccine: Up to date  Pneumococcal Vaccine: Up to date   Screening Tests Health Maintenance  Topic Date Due  . OPHTHALMOLOGY EXAM  02/02/1939  . FOOT EXAM  04/17/2016  . URINE MICROALBUMIN  06/04/2017  . HEMOGLOBIN A1C  07/11/2018  . TETANUS/TDAP  01/15/2023  . INFLUENZA VACCINE  Completed  . PNA vac Low Risk Adult  Completed   Cancer Screenings:  Colorectal Screening: No longer required.   Lung Cancer Screening: (Low Dose CT Chest recommended if Age 62-80 years, 30 pack-year currently smoking OR have quit w/in 15years.) does not qualify.    Additional Screening:  Vision Screening: Recommended annual ophthalmology exams for early detection of glaucoma and other disorders of the eye.  Dental Screening: Recommended annual dental exams for proper oral hygiene  Community Resource Referral:  CRR required this visit?  No        Plan:  I have personally reviewed and addressed the Medicare Annual Wellness questionnaire and have noted the following in the patient's chart:  A. Medical and social history B. Use of alcohol, tobacco or illicit drugs  C. Current medications and supplements D.  Functional ability and status E.  Nutritional status F.  Physical activity G. Advance directives H. List of other physicians I.  Hospitalizations, surgeries, and ER visits in  previous 12 months [facility] such as hearing and vision if needed, cognitive and depression L. Referrals and appointments - none  In addition, I have reviewed and discussed with patient certain preventive protocols, quality metrics, and best practice recommendations. A written personalized care plan for preventive services as well as general preventive health recommendations were provided to patient.  See attached scanned questionnaire for additional information.   Signed,  Fabio Neighbors, LPN Nurse Health Advisor   Nurse Recommendations: Pt needs a diabetic foot exam and urine check at next OV. Pt to set up an eye exam with Dr Sandra Cockayne in the near future.

## 2018-02-19 NOTE — Patient Instructions (Addendum)
[name] , Thank you for taking time to come for your Medicare Wellness Visit. I appreciate your ongoing commitment to your health goals. Please review the following plan we discussed and let me know if I can assist you in the future.   Screening recommendations/referrals: Colonoscopy: No longer required.  Recommended yearly ophthalmology/optometry visit for glaucoma screening and checkup Recommended yearly dental visit for hygiene and checkup  Vaccinations: Influenza vaccine: Up to date Pneumococcal vaccine: Completed series Tdap vaccine: Up to date, due 01/2023 Shingles vaccine: Pt declines today.     Advanced directives: Please bring a copy of your POA (Power of Attorney) and/or Living Will to your next appointment.   Conditions/risks identified: Obesity- recommend to exercise for 3 days a week for at least 30 minutes at a time. Continue increasing water intake to 4 glasses every day.   Next appointment: 06/11/18 with Dr Rosanna Randy.   Preventive Care 17 Years and Older, Male Preventive care refers to lifestyle choices and visits with your health care provider that can promote health and wellness. What does preventive care include?  A yearly physical exam. This is also called an annual well check.  Dental exams once or twice a year.  Routine eye exams. Ask your health care provider how often you should have your eyes checked.  Personal lifestyle choices, including:  Daily care of your teeth and gums.  Regular physical activity.  Eating a healthy diet.  Avoiding tobacco and drug use.  Limiting alcohol use.  Practicing safe sex.  Taking low doses of aspirin every day.  Taking vitamin and mineral supplements as recommended by your health care provider. What happens during an annual well check? The services and screenings done by your health care provider during your annual well check will depend on your age, overall health, lifestyle risk factors, and family history of  disease. Counseling  Your health care provider may ask you questions about your:  Alcohol use.  Tobacco use.  Drug use.  Emotional well-being.  Home and relationship well-being.  Sexual activity.  Eating habits.  History of falls.  Memory and ability to understand (cognition).  Work and work Statistician. Screening  You may have the following tests or measurements:  Height, weight, and BMI.  Blood pressure.  Lipid and cholesterol levels. These may be checked every 5 years, or more frequently if you are over 64 years old.  Skin check.  Lung cancer screening. You may have this screening every year starting at age 40 if you have a 30-pack-year history of smoking and currently smoke or have quit within the past 15 years.  Fecal occult blood test (FOBT) of the stool. You may have this test every year starting at age 53.  Flexible sigmoidoscopy or colonoscopy. You may have a sigmoidoscopy every 5 years or a colonoscopy every 10 years starting at age 3.  Prostate cancer screening. Recommendations will vary depending on your family history and other risks.  Hepatitis C blood test.  Hepatitis B blood test.  Sexually transmitted disease (STD) testing.  Diabetes screening. This is done by checking your blood sugar (glucose) after you have not eaten for a while (fasting). You may have this done every 1-3 years.  Abdominal aortic aneurysm (AAA) screening. You may need this if you are a current or former smoker.  Osteoporosis. You may be screened starting at age 19 if you are at high risk. Talk with your health care provider about your test results, treatment options, and if necessary, the  need for more tests. Vaccines  Your health care provider may recommend certain vaccines, such as:  Influenza vaccine. This is recommended every year.  Tetanus, diphtheria, and acellular pertussis (Tdap, Td) vaccine. You may need a Td booster every 10 years.  Zoster vaccine. You may  need this after age 62.  Pneumococcal 13-valent conjugate (PCV13) vaccine. One dose is recommended after age 23.  Pneumococcal polysaccharide (PPSV23) vaccine. One dose is recommended after age 51. Talk to your health care provider about which screenings and vaccines you need and how often you need them. This information is not intended to replace advice given to you by your health care provider. Make sure you discuss any questions you have with your health care provider. Document Released: 04/15/2015 Document Revised: 12/07/2015 Document Reviewed: 01/18/2015 Elsevier Interactive Patient Education  2017 [employer] Prevention in the Home Falls can cause injuries. They can happen to people of all ages. There are many things you can do to make your home safe and to help prevent falls. What can I do on the outside of my home?  Regularly fix the edges of walkways and driveways and fix any cracks.  Remove anything that might make you trip as you walk through a door, such as a raised step or threshold.  Trim any bushes or trees on the path to your home.  Use bright outdoor lighting.  Clear any walking paths of anything that might make someone trip, such as rocks or tools.  Regularly check to see if handrails are loose or broken. Make sure that both sides of any steps have handrails.  Any raised decks and porches should have guardrails on the edges.  Have any leaves, snow, or ice cleared regularly.  Use sand or salt on walking paths during winter.  Clean up any spills in your garage right away. This includes oil or grease spills. What can I do in the bathroom?  Use night lights.  Install grab bars by the toilet and in the tub and shower. Do not use towel bars as grab bars.  Use non-skid mats or decals in the tub or shower.  If you need to sit down in the shower, use a plastic, non-slip stool.  Keep the floor dry. Clean up any water that spills on the floor as soon as it  happens.  Remove soap buildup in the tub or shower regularly.  Attach bath mats securely with double-sided non-slip rug tape.  Do not have throw rugs and other things on the floor that can make you trip. What can I do in the bedroom?  Use night lights.  Make sure that you have a light by your bed that is easy to reach.  Do not use any sheets or blankets that are too big for your bed. They should not hang down onto the floor.  Have a firm chair that has side arms. You can use this for support while you get dressed.  Do not have throw rugs and other things on the floor that can make you trip. What can I do in the kitchen?  Clean up any spills right away.  Avoid walking on wet floors.  Keep items that you use a lot in easy-to-reach places.  If you need to reach something above you, use a strong step stool that has a grab bar.  Keep electrical cords out of the way.  Do not use floor polish or wax that makes floors slippery. If you must use wax,  use non-skid floor wax.  Do not have throw rugs and other things on the floor that can make you trip. What can I do with my stairs?  Do not leave any items on the stairs.  Make sure that there are handrails on both sides of the stairs and use them. Fix handrails that are broken or loose. Make sure that handrails are as long as the stairways.  Check any carpeting to make sure that it is firmly attached to the stairs. Fix any carpet that is loose or worn.  Avoid having throw rugs at the top or bottom of the stairs. If you do have throw rugs, attach them to the floor with carpet tape.  Make sure that you have a light switch at the top of the stairs and the bottom of the stairs. If you do not have them, ask someone to add them for you. What else can I do to help prevent falls?  Wear shoes that:  Do not have high heels.  Have rubber bottoms.  Are comfortable and fit you well.  Are closed at the toe. Do not wear sandals.  If you  use a stepladder:  Make sure that it is fully opened. Do not climb a closed stepladder.  Make sure that both sides of the stepladder are locked into place.  Ask someone to hold it for you, if possible.  Clearly mark and make sure that you can see:  Any grab bars or handrails.  First and last steps.  Where the edge of each step is.  Use tools that help you move around (mobility aids) if they are needed. These include:  Canes.  Walkers.  Scooters.  Crutches.  Turn on the lights when you go into a dark area. Replace any light bulbs as soon as they burn out.  Set up your furniture so you have a clear path. Avoid moving your furniture around.  If any of your floors are uneven, fix them.  If there are any pets around you, be aware of where they are.  Review your medicines with your doctor. Some medicines can make you feel dizzy. This can increase your chance of falling. Ask your doctor what other things that you can do to help prevent falls. This information is not intended to replace advice given to you by your health care provider. Make sure you discuss any questions you have with your health care provider. Document Released: 01/13/2009 Document Revised: 08/25/2015 Document Reviewed: 04/23/2014 Elsevier Interactive Patient Education  2017 [employer].

## 2018-03-13 ENCOUNTER — Telehealth: Payer: Self-pay | Admitting: Family Medicine

## 2018-03-13 DIAGNOSIS — E119 Type 2 diabetes mellitus without complications: Secondary | ICD-10-CM

## 2018-03-13 MED ORDER — GLUCOSE BLOOD VI STRP
ORAL_STRIP | 12 refills | Status: DC
Start: 2018-03-13 — End: 2018-03-18

## 2018-03-13 NOTE — Telephone Encounter (Signed)
Sent into the pharmacy.  

## 2018-03-13 NOTE — Telephone Encounter (Signed)
Pt requesting refill of Freestyle Lite Test Strips sent [facility] [address]

## 2018-03-17 ENCOUNTER — Emergency Department
Admission: EM | Admit: 2018-03-17 | Discharge: 2018-03-17 | Disposition: A | Discharge status: Home / Self Care | Payer: Medicare Other | Attending: Emergency Medicine | Admitting: Emergency Medicine

## 2018-03-17 ENCOUNTER — Emergency Department: Payer: Medicare Other

## 2018-03-17 ENCOUNTER — Encounter: Payer: Self-pay | Admitting: Emergency Medicine

## 2018-03-17 DIAGNOSIS — E039 Hypothyroidism, unspecified: Secondary | ICD-10-CM | POA: Diagnosis not present

## 2018-03-17 DIAGNOSIS — E1165 Type 2 diabetes mellitus with hyperglycemia: Secondary | ICD-10-CM | POA: Diagnosis not present

## 2018-03-17 DIAGNOSIS — R4020 Unspecified coma: Secondary | ICD-10-CM | POA: Diagnosis not present

## 2018-03-17 DIAGNOSIS — Z79899 Other long term (current) drug therapy: Secondary | ICD-10-CM | POA: Insufficient documentation

## 2018-03-17 DIAGNOSIS — E119 Type 2 diabetes mellitus without complications: Secondary | ICD-10-CM | POA: Diagnosis not present

## 2018-03-17 DIAGNOSIS — Z87891 Personal history of nicotine dependence: Secondary | ICD-10-CM | POA: Insufficient documentation

## 2018-03-17 DIAGNOSIS — R918 Other nonspecific abnormal finding of lung field: Secondary | ICD-10-CM | POA: Diagnosis not present

## 2018-03-17 DIAGNOSIS — F0390 Unspecified dementia without behavioral disturbance: Secondary | ICD-10-CM | POA: Diagnosis not present

## 2018-03-17 DIAGNOSIS — Z7984 Long term (current) use of oral hypoglycemic drugs: Secondary | ICD-10-CM | POA: Diagnosis not present

## 2018-03-17 DIAGNOSIS — R55 Syncope and collapse: Secondary | ICD-10-CM | POA: Diagnosis not present

## 2018-03-17 DIAGNOSIS — I4891 Unspecified atrial fibrillation: Secondary | ICD-10-CM | POA: Diagnosis not present

## 2018-03-17 DIAGNOSIS — R4182 Altered mental status, unspecified: Secondary | ICD-10-CM | POA: Diagnosis not present

## 2018-03-17 LAB — COMPREHENSIVE METABOLIC PANEL
ALT: 32 U/L (ref 0–44)
AST: 32 U/L (ref 15–41)
Albumin: 3.7 g/dL (ref 3.5–5.0)
Alkaline Phosphatase: 57 U/L (ref 38–126)
Anion gap: 11 (ref 5–15)
BUN: 21 mg/dL (ref 8–23)
CHLORIDE: 99 mmol/L (ref 98–111)
CO2: 23 mmol/L (ref 22–32)
Calcium: 8.8 mg/dL — ABNORMAL LOW (ref 8.9–10.3)
Creatinine, Ser: 1.27 mg/dL — ABNORMAL HIGH (ref 0.61–1.24)
GFR calc Af Amer: 58 mL/min — ABNORMAL LOW (ref >60)
GFR, EST NON AFRICAN AMERICAN: 50 mL/min — AB (ref >60)
Glucose, Bld: 293 mg/dL — ABNORMAL HIGH (ref 70–99)
POTASSIUM: 4.6 mmol/L (ref 3.5–5.1)
Sodium: 133 mmol/L — ABNORMAL LOW (ref 135–145)
Total Bilirubin: 0.4 mg/dL (ref 0.3–1.2)
Total Protein: 6.5 g/dL (ref 6.5–8.1)

## 2018-03-17 LAB — URINE DRUG SCREEN, QUALITATIVE (ARMC ONLY)
Amphetamines, Ur Screen: NOT DETECTED
BENZODIAZEPINE, UR SCRN: NOT DETECTED
Barbiturates, Ur Screen: NOT DETECTED
CANNABINOID 50 NG, UR ~~LOC~~: NOT DETECTED
Cocaine Metabolite,Ur ~~LOC~~: NOT DETECTED
MDMA (Ecstasy)Ur Screen: NOT DETECTED
Methadone Scn, Ur: NOT DETECTED
Opiate, Ur Screen: NOT DETECTED
PHENCYCLIDINE (PCP) UR S: NOT DETECTED
TRICYCLIC, UR SCREEN: NOT DETECTED

## 2018-03-17 LAB — LACTIC ACID, PLASMA: Lactic Acid, Venous: 3.4 mmol/L (ref 0.5–1.9)

## 2018-03-17 LAB — CBC WITH DIFFERENTIAL/PLATELET
Abs Immature Granulocytes: 0.03 10*3/uL (ref 0.00–0.07)
Basophils Absolute: 0 10*3/uL (ref 0.0–0.1)
Basophils Relative: 0 %
EOS ABS: 0.2 10*3/uL (ref 0.0–0.5)
EOS PCT: 4 %
HEMATOCRIT: 35.3 % — AB (ref 39.0–52.0)
HEMOGLOBIN: 12 g/dL — AB (ref 13.0–17.0)
Immature Granulocytes: 0 %
LYMPHS ABS: 1.8 10*3/uL (ref 0.7–4.0)
LYMPHS PCT: 27 %
MCH: 30.9 pg (ref 26.0–34.0)
MCHC: 34 g/dL (ref 30.0–36.0)
MCV: 91 fL (ref 80.0–100.0)
Monocytes Absolute: 0.6 10*3/uL (ref 0.1–1.0)
Monocytes Relative: 9 %
Neutro Abs: 4.1 10*3/uL (ref 1.7–7.7)
Neutrophils Relative %: 60 %
Platelets: 218 10*3/uL (ref 150–400)
RBC: 3.88 MIL/uL — ABNORMAL LOW (ref 4.22–5.81)
RDW: 13.6 % (ref 11.5–15.5)
WBC: 6.7 10*3/uL (ref 4.0–10.5)
nRBC: 0 % (ref 0.0–0.2)

## 2018-03-17 LAB — URINALYSIS, COMPLETE (UACMP) WITH MICROSCOPIC
BILIRUBIN URINE: NEGATIVE
Bacteria, UA: NONE SEEN
Glucose, UA: >=500 mg/dL — AB
HGB URINE DIPSTICK: NEGATIVE
KETONES UR: NEGATIVE mg/dL
Leukocytes, UA: NEGATIVE
NITRITE: NEGATIVE
Protein, ur: 30 mg/dL — AB
Specific Gravity, Urine: 1.02 (ref 1.005–1.030)
Squamous Epithelial / LPF: NONE SEEN (ref 0–5)
pH: 5 (ref 5.0–8.0)

## 2018-03-17 LAB — BRAIN NATRIURETIC PEPTIDE: B NATRIURETIC PEPTIDE 5: 62 pg/mL (ref 0.0–100.0)

## 2018-03-17 LAB — TROPONIN I

## 2018-03-17 MED ORDER — HALOPERIDOL LACTATE 5 MG/ML IJ SOLN
2.0000 mg | Freq: Once | INTRAMUSCULAR | Status: AC
  Administered 2018-03-17: 2 mg via INTRAVENOUS

## 2018-03-17 MED ORDER — SODIUM CHLORIDE 0.9 % IV SOLN
Freq: Once | INTRAVENOUS | Status: AC
  Administered 2018-03-17: 18:00:00 via INTRAVENOUS

## 2018-03-17 MED ORDER — SODIUM CHLORIDE 0.9 % IV SOLN
Freq: Once | INTRAVENOUS | Status: AC
  Administered 2018-03-17: 19:00:00 via INTRAVENOUS

## 2018-03-17 MED ORDER — HALOPERIDOL LACTATE 5 MG/ML IJ SOLN
INTRAMUSCULAR | Status: AC
  Administered 2018-03-17: 2 mg
  Filled 2018-03-17: qty 1

## 2018-03-17 NOTE — ED Notes (Signed)
Lactate sent to lab

## 2018-03-17 NOTE — ED Triage Notes (Signed)
Patient from home, sent to ed with c/o change in mental status. As per EMS patient with hx of dementia, but usually talkative and follows commands. Patient arrives with eyes opened non verbal.

## 2018-03-17 NOTE — Discharge Instructions (Signed)
Please follow-up with his regular doctor tomorrow and his cardiologist or you can see Dr.Arida who is on-call for cardiology.  Let them know what happened.  He would be safer if you could stay but I understand your worries about him in the hospital tonight.

## 2018-03-17 NOTE — ED Notes (Signed)
Lactic acid resulted 3.4 md aware.

## 2018-03-17 NOTE — ED Notes (Signed)
IV hep lock d/c patient given discharge instruction. Family verbalized understanding.

## 2018-03-17 NOTE — ED Notes (Signed)
Lunch box provided.

## 2018-03-17 NOTE — ED Notes (Signed)
hospitalist at bedside

## 2018-03-17 NOTE — ED Notes (Signed)
2nd liter normal saline infusing.

## 2018-03-17 NOTE — ED Provider Notes (Addendum)
[facility] Emergency Department Provider Note   ____________________________________________   First MD Initiated Contact with Patient 03/17/18 1652     (approximate)  I have reviewed the triage vital signs and the nursing notes.   HISTORY  Chief Complaint Altered Mental Status    HPI Joel Grant is a 84 y.o. male who had gone around with his wife to the store worse during the day and then he got home in the afternoon and sat down on the chair his wife went to take a nap and when she woke back up he was still sitting in the chair and was unarousable.  Her that his son came by and also could not arouse him and will he finally did manage to get him to wake up after several hours he said got is here and I am dying.  So they brought him into the hospital.  He is not usually this hard to arouse.  He has history of lung mass cancer and diabetes which she is had recently.   Past Medical History:  Diagnosis Date  . 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 Dementia ([place])   . Diabetes mellitus without complication ([place])   . Dyspnea   . Pneumonia   . Psoriasis   . Tuberculosis    per wife as a child    Patient Active Problem List   Diagnosis Date Noted  . Rash and nonspecific skin eruption 01/14/2017  . Mass of upper lobe of right lung 02/05/2016  . Sepsis ([place]) 01/28/2016  . Adenomatous colon polyp 12/09/2014  . Alzheimer's dementia ([place]) 12/09/2014  . 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 Diabetes ([place]) 12/09/2014  . Glaucoma 12/09/2014  . Adult hypothyroidism 12/09/2014  . Adiposity 12/09/2014  . Arthritis, degenerative 12/09/2014  . Hypercholesterolemia without hypertriglyceridemia 12/09/2014  . Avitaminosis D 12/09/2014    Past Surgical History:  Procedure Laterality Date  . CATARACT EXTRACTION      Prior to Admission medications   Medication Sig Start Date End Date Taking? Authorizing Provider    [facility] LANCETS ULTRA-THIN 30G MISC USE 1 LANCET TO CHECK SUGAR TWICE A DAY 06/04/16  Yes Jerrol Banana., MD  glucose blood (FREESTYLE LITE) test strip Use 1 lancet to check blood sugar twice daily. 03/13/18  Yes Jerrol Banana., MD  metFORMIN (GLUCOPHAGE) 1000 MG tablet TAKE 1 TABLET (1,000 MG TOTAL) BY MOUTH DAILY AFTER SUPPER. 09/16/17  Yes Jerrol Banana., MD    Allergies Ciprofloxacin; Hydrocodone-acetaminophen; Penicillins; and Sulfa antibiotics  Family History  Problem Relation Age of Onset  . Heart disease Mother   . Dementia Mother   . Diabetes Father   . Heart disease Father   . Peripheral vascular disease Brother   . Diabetes Brother     Social History Social History   Tobacco Use  . Smoking status: Former Smoker    Last attempt to quit: 02/20/1976    Years since quitting: 42.0  . Smokeless tobacco: Never Used  Substance Use Topics  . Alcohol use: No  . Drug use: No    Review of Systems  Constitutional: No fever/chills Eyes: No visual changes. ENT: No sore throat. Cardiovascular: Denies chest pain. Respiratory: Denies shortness of breath. Gastrointestinal: No abdominal pain.  No nausea, no vomiting.  No diarrhea.  No constipation. Genitourinary: Negative for dysuria. Musculoskeletal: Negative for back pain. Skin: Negative for rash. Neurological: Negative for headaches,  focal weakness  Allergic/Immunilogical: **}  ____________________________________________   PHYSICAL EXAM:  VITAL SIGNS: ED Triage Vitals [03/17/18 1655]  Enc Vitals Group     BP (!) 150/82     Pulse Rate 77     Resp      Temp 98.7 F (37.1 C)     Temp Source Axillary     SpO2 98 %     Weight      Height      Head Circumference      Peak Flow      Pain Score      Pain Loc      Pain Edu?      Excl. in [place]?     Constitutional: Alert and oriented. Well appearing and in no acute distress. Eyes: Conjunctivae are normal Head: Atraumatic. Nose: No  congestion/rhinnorhea. Mouth/Throat: Mucous membranes are moist.  Oropharynx non-erythematous. Neck: No stridor.  Cardiovascular: Normal rate, regular rhythm. Grossly normal heart sounds.  Good peripheral circulation. Respiratory: Normal respiratory effort.  No retractions. Lungs CTAB. Gastrointestinal: Soft and nontender. No distention. No abdominal bruits. No CVA tenderness. Musculoskeletal: No lower extremity tenderness nor edema.  No joint effusions. Neurologic:  Normal speech and language. No gross focal neurologic deficits are appreciated.  Skin:  Skin is warm, dry and intact. No rash noted. Psychiatric: Mood and affect are normal. Speech and behavior are normal.  ____________________________________________   LABS (all labs ordered are listed, but only abnormal results are displayed)  Labs Reviewed  COMPREHENSIVE METABOLIC PANEL - Abnormal; Notable for the following components:      Result Value   Sodium 133 (*)    Glucose, Bld 293 (*)    Creatinine, Ser 1.27 (*)    Calcium 8.8 (*)    GFR calc non Af Amer 50 (*)    GFR calc Af Amer 58 (*)    All other components within normal limits  URINALYSIS, COMPLETE (UACMP) WITH MICROSCOPIC - Abnormal; Notable for the following components:   Color, Urine YELLOW (*)    APPearance CLEAR (*)    Glucose, UA >=500 (*)    Protein, ur 30 (*)    All other components within normal limits  LACTIC ACID, PLASMA - Abnormal; Notable for the following components:   Lactic Acid, Venous 3.4 (*)    All other components within normal limits  CBC WITH DIFFERENTIAL/PLATELET - Abnormal; Notable for the following components:   RBC 3.88 (*)    Hemoglobin 12.0 (*)    HCT 35.3 (*)    All other components within normal limits  URINE DRUG SCREEN, QUALITATIVE ([facility] ONLY)  BRAIN NATRIURETIC PEPTIDE  TROPONIN I  LACTIC ACID, PLASMA  CBG MONITORING, ED    ____________________________________________  EKG   ____________________________________________  RADIOLOGY  ED MD interpretation: CT of the head read by radiology shows no acute disease chest x-ray read by radiology reviewed by me shows right upper lung mass with no new consolidation.  I agree.  Official radiology report(s): Ct Head Wo Contrast  Result Date: 03/17/2018 CLINICAL DATA:  Change in mental status, history of dementia and lung carcinoma EXAM: CT HEAD WITHOUT CONTRAST TECHNIQUE: Contiguous axial images were obtained from the base of the skull through the vertex without intravenous contrast. COMPARISON:  09/05/2017 FINDINGS: Brain: Generalized atrophy and chronic white matter ischemic changes are seen. No mass lesion is identified. No acute hemorrhage is seen. No findings to suggest acute infarct are noted. Vascular: No hyperdense vessel or unexpected calcification. Skull: Normal. Negative for  fracture or focal lesion. Sinuses/Orbits: Mucosal retention cysts are noted within the left maxillary antrum. The orbits are within normal limits. Other: None IMPRESSION: Chronic atrophic and ischemic changes without acute abnormality. Electronically Signed   By: Inez Catalina M.D.   On: 03/17/2018 17:22   Dg Chest Portable 1 View  Result Date: 03/17/2018 CLINICAL DATA:  84 y/o  M; change in mental status. EXAM: PORTABLE CHEST 1 VIEW COMPARISON:  09/05/2017 chest radiograph. 12/05/2017 CT chest. FINDINGS: Stable cardiac silhouette given projection and technique. Right upper lobe mass better characterized on prior CT. No new consolidation, effusion, or pneumothorax identified. Calcific aortic atherosclerosis. No acute osseous abnormality is evident. IMPRESSION: Right upper lobe mass better characterized on prior CT. No new consolidation identified. Electronically Signed   By: Kristine Garbe M.D.   On: 03/17/2018 17:19     ____________________________________________   PROCEDURES  Procedure(s) performed:   Procedures    ____________________________________________   INITIAL IMPRESSION / ASSESSMENT AND PLAN / ED COURSE  Patient's family reports they could not arouse him when EMS initially could not arouse him either.  When they did arouse him as I noted above he said he was dying and got was there.  Patient now wants to go home but his family is extremely reluctant to take him home.  I will see if needed observe him overnight for his syncope   ----------------------------------------- 9:09 PM on 03/17/2018 -----------------------------------------  As noted above patient offered admission patient's family initially wanted to have him come in the hospital but they were having increasing difficulty controlling his desire to go home and small dose of Haldol did not help enough.  They changed her mind want to go home with him.  I will let him go and he will follow-up with his regular doctor and cardiology for his syncope.     ____________________________________________   FINAL CLINICAL IMPRESSION(S) / ED DIAGNOSES  Final diagnoses:  Syncope, unspecified syncope type     ED Discharge Orders    None       Note:  This document was prepared using Dragon voice recognition software and may include unintentional dictation errors.    Nena Polio, MD 03/17/18 1947    Nena Polio, MD 03/17/18 2109

## 2018-03-17 NOTE — ED Notes (Signed)
Portable xray, ct head completed.

## 2018-03-17 NOTE — Consult Note (Signed)
[facility] NAME: Joel Grant    MR#:  [id number]  DATE OF BIRTH:  11-08-1926  DATE OF CONSULT:  03/17/2018  PRIMARY CARE PHYSICIAN: Jerrol Banana., MD   REQUESTING/REFERRING PHYSICIAN: Dr. Conni Slipper  CHIEF COMPLAINT:   Chief Complaint  Patient presents with  . Altered Mental Status    HISTORY OF PRESENT ILLNESS:  Joel Grant  is a 84 y.o. male with a known history of dementia, diabetes, lung cancer currently undergoing radiation treatment, history of psoriasis who presents to the hospital due to altered mental status.  Patient himself is a poor historian therefore most of the history obtained from the family at bedside and also the ER physician.  As per the patient's wife patient was in his usual state of health and usually takes a nap this afternoon and after he woke up from his nap he was not as arousable.  The patient's wife tried to open his eyes and also did a sternal rub but he was not very responsive.  The patient's son then arrived and attempted to lift his dad but his arms were flaccid.  EMS was called and he was brought to the hospital.  By the time he arrived to the ER his mental status was starting to improve.  Patient underwent a CT of the head in the ER which was negative for acute pathology, his metabolic and infectious work-up is also been essentially normal.  By the time I went to evaluate the patient he was back to his baseline.  As per the patient's wife patient had no seizure type activity and has not had any new medications or supplements.  She is not aware of any metastatic disease to his brain from his lung mass/cancer.  Hospitalist services were consulted for possible admission.  PAST MEDICAL HISTORY:   Past Medical History:  Diagnosis Date  . 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 Dementia ([place])   . Diabetes mellitus without complication ([place])   . Dyspnea   .  Pneumonia   . Psoriasis   . Tuberculosis    per wife as a child    PAST SURGICAL HISTOIRY:   Past Surgical History:  Procedure Laterality Date  . CATARACT EXTRACTION      SOCIAL HISTORY:   Social History   Tobacco Use  . Smoking status: Former Smoker    Last attempt to quit: 02/20/1976    Years since quitting: 42.0  . Smokeless tobacco: Never Used  Substance Use Topics  . Alcohol use: No    FAMILY HISTORY:   Family History  Problem Relation Age of Onset  . Heart disease Mother   . Dementia Mother   . Diabetes Father   . Heart disease Father   . Peripheral vascular disease Brother   . Diabetes Brother     DRUG ALLERGIES:   Allergies  Allergen Reactions  . Ciprofloxacin Anaphylaxis  . Hydrocodone-Acetaminophen Nausea And Vomiting  . Penicillins     rash  . Sulfa Antibiotics     REVIEW OF SYSTEMS:   Review of Systems  Unable to perform ROS: Dementia     MEDICATIONS AT HOME:   Prior to Admission medications   Medication Sig Start Date End Date Taking? Authorizing Provider  [facility] LANCETS ULTRA-THIN 30G MISC USE 1 LANCET TO CHECK SUGAR TWICE A DAY 06/04/16  Yes Jerrol Banana., MD  glucose  blood (FREESTYLE LITE) test strip Use 1 lancet to check blood sugar twice daily. 03/13/18  Yes Jerrol Banana., MD  metFORMIN (GLUCOPHAGE) 1000 MG tablet TAKE 1 TABLET (1,000 MG TOTAL) BY MOUTH DAILY AFTER SUPPER. 09/16/17  Yes Jerrol Banana., MD      VITAL SIGNS:  Blood pressure 139/74, pulse 79, temperature 98.7 F (37.1 C), temperature source Axillary, resp. rate (!) 23, SpO2 95 %.  PHYSICAL EXAMINATION:  GENERAL:  84 y.o.-year-old patient lying in  bed in no acute distress.  EYES: Pupils equal, round, reactive to light and accommodation. No scleral icterus. Extraocular muscles intact.  HEENT: Head atraumatic, normocephalic. Oropharynx and nasopharynx clear.  NECK:  Supple, no jugular venous distention. No thyroid enlargement, no tenderness.   LUNGS: Normal breath sounds bilaterally, no wheezing, rales, rhonchi . No use of accessory muscles of respiration.  CARDIOVASCULAR: S1, S2, RRR. No murmurs, rubs, gallops, clicks.  ABDOMEN: Soft, nontender, nondistended. Bowel sounds present. No organomegaly or mass.  EXTREMITIES: No pedal edema, cyanosis, or clubbing.  NEUROLOGIC: Cranial nerves II through XII are intact. No focal motor or sensory deficits appreciated bilaterally  PSYCHIATRIC: The patient is alert and oriented x 1.  SKIN: No obvious rash, lesion, or ulcer.   LABORATORY PANEL:   CBC Recent Labs  Lab 03/17/18 1659  WBC 6.7  HGB 12.0*  HCT 35.3*  PLT 218   ------------------------------------------------------------------------------------------------------------------  Chemistries  Recent Labs  Lab 03/17/18 1659  NA 133*  K 4.6  CL 99  CO2 23  GLUCOSE 293*  BUN 21  CREATININE 1.27*  CALCIUM 8.8*  AST 32  ALT 32  ALKPHOS 57  BILITOT 0.4   ------------------------------------------------------------------------------------------------------------------  Cardiac Enzymes Recent Labs  Lab 03/17/18 1659  TROPONINI <0.03   ------------------------------------------------------------------------------------------------------------------  RADIOLOGY:  Ct Head Wo Contrast  Result Date: 03/17/2018 CLINICAL DATA:  Change in mental status, history of dementia and lung carcinoma EXAM: CT HEAD WITHOUT CONTRAST TECHNIQUE: Contiguous axial images were obtained from the base of the skull through the vertex without intravenous contrast. COMPARISON:  09/05/2017 FINDINGS: Brain: Generalized atrophy and chronic white matter ischemic changes are seen. No mass lesion is identified. No acute hemorrhage is seen. No findings to suggest acute infarct are noted. Vascular: No hyperdense vessel or unexpected calcification. Skull: Normal. Negative for fracture or focal lesion. Sinuses/Orbits: Mucosal retention cysts are noted  within the left maxillary antrum. The orbits are within normal limits. Other: None IMPRESSION: Chronic atrophic and ischemic changes without acute abnormality. Electronically Signed   By: Inez Catalina M.D.   On: 03/17/2018 17:22   Dg Chest Portable 1 View  Result Date: 03/17/2018 CLINICAL DATA:  84 y/o  M; change in mental status. EXAM: PORTABLE CHEST 1 VIEW COMPARISON:  09/05/2017 chest radiograph. 12/05/2017 CT chest. FINDINGS: Stable cardiac silhouette given projection and technique. Right upper lobe mass better characterized on prior CT. No new consolidation, effusion, or pneumothorax identified. Calcific aortic atherosclerosis. No acute osseous abnormality is evident. IMPRESSION: Right upper lobe mass better characterized on prior CT. No new consolidation identified. Electronically Signed   By: Kristine Garbe M.D.   On: 03/17/2018 17:19     IMPRESSION AND PLAN:   84 year old male with past medical history of dementia, right lung mass currently ongoing radiation treatment, history of psoriasis, diabetes who presented to the hospital due to altered mental status.  1.  Altered mental status-etiology unclear presently.  Patient had an extensive work-up here in the ER including CT head which was  negative for acute pathology.  Patient's urinalysis is negative urine drug screen was negative.  There is no acute metabolic abnormalities on his electrolytes. - Patient has had no seizure type activity.  Patient's mental status is actually improved and is back to baseline here in the ER.  Patient is at high risk for going under sundowning and the family would prefer to take him home. - Patient could possibly benefit from an MRI of the brain to rule out brain metastases given his lung mass but this can be done as an outpatient.  Discussed this with the patient's family and also the ER physician and they are in agreement.  2.  Diabetes-patient will continue his metformin, he had no evidence of  hypoglycemia.  3.  Lung mass-patient has finished radiation treatment continue follow-up with oncology as an outpatient.  Patient is being discharged home from the ER with follow-up with primary care physician as an outpatient.    All the records are reviewed and case discussed with Consulting provider. Management plans discussed with the patient, family and they are in agreement.  CODE STATUS: Full code  TOTAL TIME TAKING CARE OF THIS PATIENT: 40 minutes.    Henreitta Leber M.D on 03/17/2018 at 9:24 PM  Between 7am to 6pm - Pager - [phone]  After 6pm go to [email or web address] - password [email or web address] [facility]  [facility]  Office  [phone]  CC: Primary care Physician: Jerrol Banana., MD

## 2018-03-18 ENCOUNTER — Telehealth: Payer: Self-pay

## 2018-03-18 ENCOUNTER — Ambulatory Visit (INDEPENDENT_AMBULATORY_CARE_PROVIDER_SITE_OTHER): Payer: Medicare Other | Admitting: Family Medicine

## 2018-03-18 VITALS — BP 120/64 | HR 67 | Temp 98.1°F | Resp 16 | Wt 205.0 lb

## 2018-03-18 DIAGNOSIS — F02818 Dementia in other diseases classified elsewhere, unspecified severity, with other behavioral disturbance: Secondary | ICD-10-CM

## 2018-03-18 DIAGNOSIS — E6609 Other obesity due to excess calories: Secondary | ICD-10-CM

## 2018-03-18 DIAGNOSIS — R918 Other nonspecific abnormal finding of lung field: Secondary | ICD-10-CM

## 2018-03-18 DIAGNOSIS — F0281 Dementia in other diseases classified elsewhere with behavioral disturbance: Secondary | ICD-10-CM | POA: Diagnosis not present

## 2018-03-18 DIAGNOSIS — R55 Syncope and collapse: Secondary | ICD-10-CM

## 2018-03-18 DIAGNOSIS — Z6832 Body mass index (BMI) 32.0-32.9, adult: Secondary | ICD-10-CM | POA: Diagnosis not present

## 2018-03-18 DIAGNOSIS — E119 Type 2 diabetes mellitus without complications: Secondary | ICD-10-CM

## 2018-03-18 DIAGNOSIS — G301 Alzheimer's disease with late onset: Secondary | ICD-10-CM | POA: Diagnosis not present

## 2018-03-18 NOTE — Telephone Encounter (Signed)
Attempted to schedule [facility] ed fu .  Patient wife wants him to see pcp first and will call if needing appt in future. Nothing further needed at this time

## 2018-03-18 NOTE — Progress Notes (Signed)
LAWSEN ARNOTT  MRN: [id number] DOB: 04-27-1926  Subjective:  HPI   The patient is an 84 year old male who p resents today for ER follow up.  The patient was seen yesterday at [facility] for altered mental status and inability to arouse, and possible syncope.    The patient did not want to stay in the hospital and therefore they were unable to admit the patient.   Wife and son are with the patient today.  His dementia is getting progressively worse.  He has mild behavioral disturbance and that he gets agitated.  I am worried about him wandering  Patient Active Problem List   Diagnosis Date Noted  . Rash and nonspecific skin eruption 01/14/2017  . Mass of upper lobe of right lung 02/05/2016  . Sepsis ([place]) 01/28/2016  . Adenomatous colon polyp 12/09/2014  . Alzheimer's dementia ([place]) 12/09/2014  . 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 Diabetes ([place]) 12/09/2014  . Glaucoma 12/09/2014  . Adult hypothyroidism 12/09/2014  . Adiposity 12/09/2014  . Arthritis, degenerative 12/09/2014  . Hypercholesterolemia without hypertriglyceridemia 12/09/2014  . Avitaminosis D 12/09/2014    Past Medical History:  Diagnosis Date  . 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 Dementia ([place])   . Diabetes mellitus without complication ([place])   . Dyspnea   . Pneumonia   . Psoriasis   . Tuberculosis    per wife as a child    Social History   Socioeconomic History  . Marital status: Married    Spouse name: Not on file  . Number of children: 1  . Years of education: Not on file  . Highest education level: 7th grade  Occupational History  . Occupation: retired  Scientific laboratory technician  . Financial resource strain: Not hard at all  . Food insecurity:    Worry: Never true    Inability: Never true  . Transportation needs:    Medical: No    Non-medical: No  Tobacco Use  . Smoking status: Former Smoker    Last attempt to quit: 02/20/1976    Years since quitting: 42.1    . Smokeless tobacco: Never Used  Substance and Sexual Activity  . Alcohol use: No  . Drug use: No  . Sexual activity: Not on file  Lifestyle  . Physical activity:    Days per week: 0 days    Minutes per session: 0 min  . Stress: Not at all  Relationships  . Social connections:    Talks on phone: Patient refused    Gets together: Patient refused    Attends religious service: Patient refused    Active member of club or organization: Patient refused    Attends meetings of clubs or organizations: Patient refused    Relationship status: Patient refused  . Intimate partner violence:    Fear of current or ex partner: Patient refused    Emotionally abused: Patient refused    Physically abused: Patient refused    Forced sexual activity: Patient refused  Other Topics Concern  . Not on file  Social History Narrative  . Not on file    Outpatient Encounter Medications as of 03/18/2018  Medication Sig  . [facility] LANCETS ULTRA-THIN 30G MISC USE 1 LANCET TO CHECK SUGAR TWICE A DAY  . metFORMIN (GLUCOPHAGE) 1000 MG tablet TAKE 1 TABLET (1,000 MG TOTAL) BY MOUTH DAILY AFTER SUPPER.  . [DISCONTINUED] glucose blood (FREESTYLE LITE) test strip Use 1 lancet  to check blood sugar twice daily.   No facility-administered encounter medications on file as of 03/18/2018.     Allergies  Allergen Reactions  . Ciprofloxacin Anaphylaxis  . Hydrocodone-Acetaminophen Nausea And Vomiting  . Penicillins     rash  . Sulfa Antibiotics     Review of Systems  Constitutional: Negative for fever.  HENT: Negative.   Eyes: Negative.   Respiratory: Negative for cough, shortness of breath and wheezing.   Cardiovascular: Negative for chest pain.  Gastrointestinal: Negative.   Musculoskeletal: Negative.   Skin: Negative.   Neurological: Negative.   Endo/Heme/Allergies: Negative.   Psychiatric/Behavioral: Negative.     Objective:  BP 120/64 (BP Location: Right Arm, Patient Position: Sitting, Cuff Size:  Normal)   Pulse 67   Temp 98.1 F (36.7 C) (Oral)   Resp 16   Wt 205 lb (93 kg)   SpO2 95%   BMI 32.11 kg/m   Physical Exam  Constitutional: He is well-developed, well-nourished, and in no distress.  HENT:  Head: Normocephalic and atraumatic.  Eyes: Conjunctivae are normal. No scleral icterus.  Neck: No thyromegaly present.  Cardiovascular: Normal rate, regular rhythm and normal heart sounds.  Pulmonary/Chest: Effort normal and breath sounds normal.  Abdominal: Soft.  Neurological: He is alert. Gait normal. GCS score is 15.  Skin: Skin is warm and dry.  Psychiatric: Mood and affect normal.  ECG reveals normal sinus rhythm incomplete bundle blanch block with no  Ischemic changes noted.  Assessment and Plan :   1. Syncope, unspecified syncope type After discussion with the family will not work this up anymore at this time.  Had a normal EKG labs and scan of the head. - EKG 12-Lead  2. Diabetes mellitus without complication ([place]) I do not think the syncope was hypoglycemia.  More than 50% of 30-minute visit is spent in counseling and coordination of care  3. Late onset Alzheimer's disease with behavioral disturbance ([place]) This is progressive and we may need to get CCM involved to help the wife deal with future issues.  I am worried about the patient wandering with the winter coming.  He has not wandered to this point and does not have any significant behavioral issues other than he gets easily agitated  4. Mass of upper lobe of right lung Most likely cancer.  5. Class 1 obesity due to excess calories without serious comorbidity with body mass index (BMI) of 32.0 to 32.9 in adult  I have done the exam and reviewed the chart and it is accurate to the best of my knowledge. Development worker, community has been used and  any errors in dictation or transcription are unintentional. Miguel Aschoff M.D. [facility]

## 2018-03-19 ENCOUNTER — Other Ambulatory Visit: Payer: Self-pay

## 2018-03-19 ENCOUNTER — Ambulatory Visit: Payer: Medicare Other | Admitting: Family Medicine

## 2018-03-19 DIAGNOSIS — E119 Type 2 diabetes mellitus without complications: Secondary | ICD-10-CM

## 2018-03-19 MED ORDER — GLUCOSE BLOOD VI STRP: ORAL_STRIP | 12 refills | Status: DC

## 2018-03-19 MED ORDER — ACCU-CHEK AVIVA DEVI: 0 refills | Status: DC

## 2018-03-19 MED ORDER — ACCU-CHEK AVIVA DEVI: 0 refills | Status: AC

## 2018-03-19 NOTE — Telephone Encounter (Signed)
Patients wife had called to get status of glucose test strips, I had advised her that it was sent today to [facility] order. Wife states that neither her or patient have ever used [employer] and is requesting that strips be sent to [facility] [address]. She request for CMA to give her a call back once it has been corrected. KW

## 2018-03-20 ENCOUNTER — Telehealth: Payer: Self-pay | Admitting: Family Medicine

## 2018-03-20 ENCOUNTER — Other Ambulatory Visit: Payer: Self-pay

## 2018-03-20 DIAGNOSIS — E119 Type 2 diabetes mellitus without complications: Secondary | ICD-10-CM

## 2018-03-20 MED ORDER — GLUCOSE BLOOD VI STRP: 1.0000 | ORAL_STRIP | Freq: Every day | 12 refills | Status: AC

## 2018-03-20 NOTE — Telephone Encounter (Signed)
done

## 2018-03-20 NOTE — Telephone Encounter (Signed)
Pt calling needing the test strips - pharmacy is needing the instructions to fill the Rx. glucose blood (ACCU-CHEK AVIVA) test strip  Please fill at:  [id number] [address], [place] [phone] (Phone) [phone] (Fax)   Thanks,  [employer]

## 2018-05-09 ENCOUNTER — Telehealth: Payer: Self-pay | Admitting: Family Medicine

## 2018-05-09 NOTE — Telephone Encounter (Signed)
It is on the cart with other orders that I sorted through this morning for you.

## 2018-05-09 NOTE — Telephone Encounter (Signed)
[place] [phone]   PT - OT - Speech Therapy orders were faxed on Wed. These need to be signed and returned.  Thanks, [employer]

## 2018-05-12 DIAGNOSIS — R41841 Cognitive communication deficit: Secondary | ICD-10-CM | POA: Diagnosis not present

## 2018-05-12 DIAGNOSIS — R278 Other lack of coordination: Secondary | ICD-10-CM | POA: Diagnosis not present

## 2018-05-12 DIAGNOSIS — F0391 Unspecified dementia with behavioral disturbance: Secondary | ICD-10-CM | POA: Diagnosis not present

## 2018-05-12 NOTE — Telephone Encounter (Signed)
M[place] with [facility] care called wanting to know if she can get orderd verbal this am.  She would like to start PT and speech.  CB#  [phone]  Thanks [employer]

## 2018-05-12 NOTE — Telephone Encounter (Signed)
Advised  ED 

## 2018-05-16 DIAGNOSIS — R41841 Cognitive communication deficit: Secondary | ICD-10-CM | POA: Diagnosis not present

## 2018-05-16 DIAGNOSIS — R278 Other lack of coordination: Secondary | ICD-10-CM | POA: Diagnosis not present

## 2018-05-16 DIAGNOSIS — F0391 Unspecified dementia with behavioral disturbance: Secondary | ICD-10-CM | POA: Diagnosis not present

## 2018-05-19 DIAGNOSIS — R278 Other lack of coordination: Secondary | ICD-10-CM | POA: Diagnosis not present

## 2018-05-19 DIAGNOSIS — R41841 Cognitive communication deficit: Secondary | ICD-10-CM | POA: Diagnosis not present

## 2018-05-19 DIAGNOSIS — F0391 Unspecified dementia with behavioral disturbance: Secondary | ICD-10-CM | POA: Diagnosis not present

## 2018-05-20 ENCOUNTER — Other Ambulatory Visit: Payer: Self-pay

## 2018-05-20 ENCOUNTER — Encounter: Payer: Self-pay | Admitting: Family Medicine

## 2018-05-20 ENCOUNTER — Ambulatory Visit (INDEPENDENT_AMBULATORY_CARE_PROVIDER_SITE_OTHER): Payer: Medicare HMO | Admitting: Family Medicine

## 2018-05-20 VITALS — BP 125/66 | HR 85 | Temp 98.6°F | Ht 67.0 in | Wt 201.2 lb

## 2018-05-20 DIAGNOSIS — R918 Other nonspecific abnormal finding of lung field: Secondary | ICD-10-CM | POA: Diagnosis not present

## 2018-05-20 DIAGNOSIS — E039 Hypothyroidism, unspecified: Secondary | ICD-10-CM | POA: Diagnosis not present

## 2018-05-20 DIAGNOSIS — Z6831 Body mass index (BMI) 31.0-31.9, adult: Secondary | ICD-10-CM | POA: Diagnosis not present

## 2018-05-20 DIAGNOSIS — Z6832 Body mass index (BMI) 32.0-32.9, adult: Secondary | ICD-10-CM

## 2018-05-20 DIAGNOSIS — M170 Bilateral primary osteoarthritis of knee: Secondary | ICD-10-CM | POA: Diagnosis not present

## 2018-05-20 DIAGNOSIS — E119 Type 2 diabetes mellitus without complications: Secondary | ICD-10-CM | POA: Diagnosis not present

## 2018-05-20 DIAGNOSIS — G301 Alzheimer's disease with late onset: Secondary | ICD-10-CM

## 2018-05-20 DIAGNOSIS — F0281 Dementia in other diseases classified elsewhere with behavioral disturbance: Secondary | ICD-10-CM | POA: Diagnosis not present

## 2018-05-20 DIAGNOSIS — E6609 Other obesity due to excess calories: Secondary | ICD-10-CM

## 2018-05-20 DIAGNOSIS — F02818 Dementia in other diseases classified elsewhere, unspecified severity, with other behavioral disturbance: Secondary | ICD-10-CM

## 2018-05-20 LAB — POCT GLYCOSYLATED HEMOGLOBIN (HGB A1C): Hemoglobin A1C: 9.5 % — AB (ref 4.0–5.6)

## 2018-05-20 NOTE — Progress Notes (Addendum)
Patient: Joel Grant Male    DOB: 1927-03-18   85 y.o.   MRN: [id number] Visit Date: 05/20/2018  Today's Provider: Wilhemena Durie, MD   Chief Complaint  Patient presents with  . Follow-up    2 month on syncope and diabetes   Subjective:     HPI   Follow up for syncope and diabetes  The patient was last seen for this 2 months ago. Changes made at last visit include no changes  He reports good compliance with treatment. He feels that condition is Unchanged. He is not having side effects.   ------------------------------------------------------------------------------------   Allergies  Allergen Reactions  . Ciprofloxacin Anaphylaxis  . Hydrocodone-Acetaminophen Nausea And Vomiting  . Penicillins     rash  . Sulfa Antibiotics      Current Outpatient Medications:  .  Blood Glucose Monitoring Suppl (ACCU-CHEK AVIVA) device, Use as instructed, Disp: 1 each, Rfl: 0 .  [facility]S LANCETS ULTRA-THIN 30G MISC, USE 1 LANCET TO CHECK SUGAR TWICE A DAY, Disp: 200 each, Rfl: 12 .  glucose blood (ACCU-CHEK AVIVA) test strip, 1 each by Other route daily. Use as instructed, Disp: 100 each, Rfl: 12 .  metFORMIN (GLUCOPHAGE) 1000 MG tablet, TAKE 1 TABLET (1,000 MG TOTAL) BY MOUTH DAILY AFTER SUPPER., Disp: 90 tablet, Rfl: 3  Review of Systems  Constitutional: Negative.   HENT: Negative.   Eyes: Negative.   Respiratory: Negative.   Cardiovascular: Negative.   Gastrointestinal: Negative.   Endocrine: Negative.   Genitourinary: Negative.   Musculoskeletal: Negative.   Skin: Negative.   Allergic/Immunologic: Negative.   Neurological: Negative.   Hematological: Negative.   Psychiatric/Behavioral: Negative.     Social History   Tobacco Use  . Smoking status: Former Smoker    Last attempt to quit: 02/20/1976    Years since quitting: 42.2  . Smokeless tobacco: Never Used  Substance Use Topics  . Alcohol use: No      Objective:   BP 125/66 (BP Location: Right Arm,  Patient Position: Sitting, Cuff Size: Normal)   Pulse 85   Temp 98.6 F (37 C) (Oral)   Ht 5\' 7"  (1.702 m)   Wt 201 lb 3.2 oz (91.3 kg)   BMI 31.51 kg/m  Vitals:   05/20/18 1345  BP: 125/66  Pulse: 85  Temp: 98.6 F (37 C)  TempSrc: Oral  Weight: 201 lb 3.2 oz (91.3 kg)  Height: 5\' 7"  (1.702 m)     Physical Exam Vitals signs reviewed.  Constitutional:      Appearance: He is well-developed.  HENT:     Head: Normocephalic and atraumatic.     Right Ear: External ear normal.     Left Ear: External ear normal.     Nose: Nose normal.  Eyes:     General: No scleral icterus.    Conjunctiva/sclera: Conjunctivae normal.  Neck:     Thyroid: No thyromegaly.  Cardiovascular:     Rate and Rhythm: Normal rate and regular rhythm.     Heart sounds: Normal heart sounds.  Pulmonary:     Effort: Pulmonary effort is normal.     Breath sounds: Normal breath sounds.  Abdominal:     Palpations: Abdomen is soft.  Skin:    General: Skin is warm and dry.  Neurological:     General: No focal deficit present.     Mental Status: He is alert. Mental status is at baseline.  Psychiatric:  Mood and Affect: Mood normal.        Behavior: Behavior normal.         Assessment & Plan    1. Type 2 diabetes mellitus without complication, without long-term current use of insulin (HCC)  - POCT glycosylated hemoglobin (Hb A1C)--8.7 today.  2. Adult hypothyroidism   3. Late onset Alzheimer's disease with behavioral disturbance [facility]) Patient still lives alone with his wife.  No aggressive behaviors.  He still wants to drive.  Have told him no driving.  Will follow with family.  May need placement in memory care unit at some point in time.  4. Mass of upper lobe of right lung   5. Class 1 obesity due to excess calories with serious comorbidity with body mass index (BMI) of 32.0 to 32.9 in adult With diabetes and arthritis.    I have done the exam and reviewed the above chart and it is  accurate to the best of my knowledge. Development worker, community has been used in this note in any air is in the dictation or transcription are unintentional.  Wilhemena Durie, MD  [place]

## 2018-05-21 DIAGNOSIS — F0391 Unspecified dementia with behavioral disturbance: Secondary | ICD-10-CM | POA: Diagnosis not present

## 2018-05-21 DIAGNOSIS — R41841 Cognitive communication deficit: Secondary | ICD-10-CM | POA: Diagnosis not present

## 2018-05-21 DIAGNOSIS — R278 Other lack of coordination: Secondary | ICD-10-CM | POA: Diagnosis not present

## 2018-05-23 DIAGNOSIS — F0391 Unspecified dementia with behavioral disturbance: Secondary | ICD-10-CM | POA: Diagnosis not present

## 2018-05-23 DIAGNOSIS — R41841 Cognitive communication deficit: Secondary | ICD-10-CM | POA: Diagnosis not present

## 2018-05-23 DIAGNOSIS — R278 Other lack of coordination: Secondary | ICD-10-CM | POA: Diagnosis not present

## 2018-05-26 DIAGNOSIS — F0391 Unspecified dementia with behavioral disturbance: Secondary | ICD-10-CM | POA: Diagnosis not present

## 2018-05-26 DIAGNOSIS — R41841 Cognitive communication deficit: Secondary | ICD-10-CM | POA: Diagnosis not present

## 2018-05-26 DIAGNOSIS — R278 Other lack of coordination: Secondary | ICD-10-CM | POA: Diagnosis not present

## 2018-05-28 DIAGNOSIS — F0391 Unspecified dementia with behavioral disturbance: Secondary | ICD-10-CM | POA: Diagnosis not present

## 2018-05-28 DIAGNOSIS — R278 Other lack of coordination: Secondary | ICD-10-CM | POA: Diagnosis not present

## 2018-05-28 DIAGNOSIS — R41841 Cognitive communication deficit: Secondary | ICD-10-CM | POA: Diagnosis not present

## 2018-05-30 DIAGNOSIS — F0391 Unspecified dementia with behavioral disturbance: Secondary | ICD-10-CM | POA: Diagnosis not present

## 2018-05-30 DIAGNOSIS — R278 Other lack of coordination: Secondary | ICD-10-CM | POA: Diagnosis not present

## 2018-05-30 DIAGNOSIS — R41841 Cognitive communication deficit: Secondary | ICD-10-CM | POA: Diagnosis not present

## 2018-06-08 NOTE — Progress Notes (Signed)
[place]  [phone] [phone]) [phone]  ID: Johnston Ebbs OB: [birth year]  MR#: [id number]  KVQ#:[id number]  Patient Care Team: Jerrol Banana., MD as PCP - General (Family Medicine) Lloyd Huger, MD as Consulting Physician (Oncology) Noreene Filbert, MD as Referring Physician (Radiation Oncology) Estill Cotta, MD (Ophthalmology)  CHIEF COMPLAINT: Right upper lobe lung mass.  INTERVAL HISTORY: Patient returns to clinic today for routine evaluation and discussion of his imaging results.  He currently is well.  Given his underlying dementia, the entire history is given by his wife and son.  He has no neurologic complaints. He denies any recent fevers. He has a good appetite and denies weight loss. He denies any chest pain, shortness of breath, cough, or hemoptysis.  He has no nausea, vomiting, constipation, or diarrhea. He has no urinary complaints.  Patient offers no specific complaints today.  REVIEW OF SYSTEMS:   Review of Systems  Constitutional: Negative.  Negative for fever, malaise/fatigue and weight loss.  Respiratory: Negative.  Negative for cough, hemoptysis, sputum production and shortness of breath.   Cardiovascular: Negative.  Negative for chest pain and leg swelling.  Gastrointestinal: Negative.  Negative for abdominal pain.  Genitourinary: Negative.  Negative for dysuria.  Musculoskeletal: Negative.  Negative for back pain.  Skin: Negative.  Negative for rash.  Neurological: Negative.  Negative for focal weakness, weakness and headaches.  Psychiatric/Behavioral: Positive for memory loss. The patient is not nervous/anxious.     As per HPI. Otherwise, a complete review of systems is negative.  PAST MEDICAL HISTORY: Past Medical History:  Diagnosis Date  . 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 Dementia ([place])   . Diabetes mellitus without complication ([place])   . Dyspnea   .  Pneumonia   . Psoriasis   . Tuberculosis    per wife as a child    PAST SURGICAL HISTORY: Past Surgical History:  Procedure Laterality Date  . CATARACT EXTRACTION      FAMILY HISTORY: Family History  Problem Relation Age of Onset  . Heart disease Mother   . Dementia Mother   . Diabetes Father   . Heart disease Father   . Peripheral vascular disease Brother   . Diabetes Brother     ADVANCED DIRECTIVES (Y/N):  N  HEALTH MAINTENANCE: Social History   Tobacco Use  . Smoking status: Former Smoker    Last attempt to quit: 02/20/1976    Years since quitting: 42.3  . Smokeless tobacco: Never Used  Substance Use Topics  . Alcohol use: No  . Drug use: No     Colonoscopy:  PAP:  Bone density:  Lipid panel:  Allergies  Allergen Reactions  . Ciprofloxacin Anaphylaxis  . Hydrocodone-Acetaminophen Nausea And Vomiting  . Penicillins     rash  . Sulfa Antibiotics     Current Outpatient Medications  Medication Sig Dispense Refill  . Blood Glucose Monitoring Suppl (ACCU-CHEK AVIVA) device Use as instructed 1 each 0  . [employer] LANCETS ULTRA-THIN 30G MISC USE 1 LANCET TO CHECK SUGAR TWICE A DAY 200 each 12  . glucose blood (ACCU-CHEK AVIVA) test strip 1 each by Other route daily. Use as instructed 100 each 12  . metFORMIN (GLUCOPHAGE) 1000 MG tablet TAKE 1 TABLET (1,000 MG TOTAL) BY MOUTH DAILY AFTER SUPPER. 90 tablet 3   No current facility-administered medications for this visit.     OBJECTIVE: Vitals:   06/12/18 1136  BP: 114/68  Pulse: 81  Temp: (!) 97.5 F (36.4 C)     Body mass index is 31.03 kg/m.    ECOG FS:1 - Symptomatic but completely ambulatory  General: Well-developed, well-nourished, no acute distress. Eyes: Pink conjunctiva, anicteric sclera. HEENT: Normocephalic, moist mucous membranes. Lungs: Clear to auscultation bilaterally. Heart: Regular rate and rhythm. No rubs, murmurs, or gallops. Abdomen: Soft, nontender, nondistended. No organomegaly  noted, normoactive bowel sounds. Musculoskeletal: No edema, cyanosis, or clubbing. Neuro: Alert, confused. Cranial nerves grossly intact. Skin: No rashes or petechiae noted. Psych: Normal affect.  LAB RESULTS:  Lab Results  Component Value Date   NA 133 (L) 03/17/2018   K 4.6 03/17/2018   CL 99 03/17/2018   CO2 23 03/17/2018   GLUCOSE 293 (H) 03/17/2018   BUN 21 03/17/2018   CREATININE 1.60 (H) 06/10/2018   CALCIUM 8.8 (L) 03/17/2018   PROT 6.5 03/17/2018   ALBUMIN 3.7 03/17/2018   AST 32 03/17/2018   ALT 32 03/17/2018   ALKPHOS 57 03/17/2018   BILITOT 0.4 03/17/2018   GFRNONAA 50 (L) 03/17/2018   GFRAA 58 (L) 03/17/2018    Lab Results  Component Value Date   WBC 6.7 03/17/2018   NEUTROABS 4.1 03/17/2018   HGB 12.0 (L) 03/17/2018   HCT 35.3 (L) 03/17/2018   MCV 91.0 03/17/2018   PLT 218 03/17/2018     STUDIES: Ct Chest W Contrast  Result Date: 06/10/2018 CLINICAL DATA:  Enlarging right upper lobe lung mass, presumed non-small cell lung cancer (no biopsy performed), status post radiation therapy completed June 2019. Restaging. EXAM: CT CHEST WITH CONTRAST TECHNIQUE: Multidetector CT imaging of the chest was performed during intravenous contrast administration. CONTRAST:  26mL OMNIPAQUE IOHEXOL 300 MG/ML  SOLN COMPARISON:  12/05/2017 chest CT. FINDINGS: Cardiovascular: Normal heart size. No significant pericardial effusion/thickening. Left main and three-vessel coronary atherosclerosis. Atherosclerotic nonaneurysmal thoracic aorta. Normal caliber pulmonary arteries. No central pulmonary emboli. Mediastinum/Nodes: Stable hypodense bilateral thyroid nodules, largest 1.3 cm on the right. Unremarkable esophagus. No axillary adenopathy. Stable calcified right paratracheal and bilateral hilar nonenlarged nodes suggesting prior granulomatous disease. No pathologically enlarged noncalcified mediastinal or hilar nodes. Lungs/Pleura: No pneumothorax. No pleural effusion. Mild  centrilobular emphysema. Right upper lobe 5.5 x 2.3 cm masslike focus of consolidation (series 3/image 40), previously 5.9 x 2.6 cm using similar measurement technique, mildly decreased. New patchy nodular consolidation in the posterior right upper lobe (series 3/image 50) appears bandlike on the sagittal reformats (series 6/image 59), favor evolving postradiation change. Left lower lobe ground-glass 1.2 cm pulmonary nodule (series 3/image 99) is stable since 01/28/2016 chest CT. Scattered tiny solid pulmonary nodules in the left upper lobe measuring up to 4 mm (series 3/image 61) are all stable since 01/28/2016 chest CT and considered benign. Sub solid medial left upper lobe 9 x 5 mm nodule (series 3/image 49), previously 10 x 7 mm, slightly decreased in size and density. No additional significant pulmonary nodules. Upper abdomen: Diffuse hepatic steatosis. Partially visualized subcentimeter hypodense lateral upper right renal cortical lesion, too small to characterize. Musculoskeletal: No aggressive appearing focal osseous lesions. Moderate thoracic spondylosis. IMPRESSION: 1. Masslike consolidation in the right upper lobe is mildly decreased in size, compatible with decreasing tumor and/or evolving radiation fibrosis. 2. No findings highly suspicious for new or progressive metastatic disease in the chest. No thoracic adenopathy. 3. New patchy nodular consolidation in the posterior right upper lobe, which appears bandlike on the sagittal reformats, favor evolving postradiation change, recommend attention on continued chest  CT follow-up. 4. Subsolid left upper lobe pulmonary nodule is slightly decreased in size and density. Ground-glass left lower lobe pulmonary nodule is stable. Continued attention on chest CT follow-up advised. Aortic Atherosclerosis (ICD10-I70.0) and Emphysema (ICD10-J43.9). Electronically Signed   By: Ilona Sorrel M.D.   On: 06/10/2018 10:54    ASSESSMENT: Right upper lobe lung  mass.  PLAN:    1. Right upper lobe lung mass: Although no biopsy was able to be performed, given the continued growth this is highly suspicious for underlying malignancy and patient underwent palliative XRT completing in June 2019.  CT scan results from June 10, 2018 reviewed independently and report as above with right upper lobe lesion mildly decreased in size.  Patient also noted to have evolving radiation fibrosis.  No further intervention is needed.  Return to clinic in 6 months with repeat imaging and further evaluation. 2. Dementia: Patient appears to be at his baseline.  I spent a total of 20 minutes face-to-face with the patient of which greater than 50% of the visit was spent in counseling and coordination of care as detailed above.    Patient expressed understanding and was in agreement with this plan. He also understands that He can call clinic at any time with any questions, concerns, or complaints.   Cancer Staging No matching staging information was found for the patient.  Lloyd Huger, MD   06/14/2018 8:48 AM

## 2018-06-10 ENCOUNTER — Ambulatory Visit
Admission: RE | Admit: 2018-06-10 | Discharge: 2018-06-10 | Disposition: A | Discharge status: Home / Self Care | Payer: Medicare HMO | Source: Ambulatory Visit | Attending: Oncology | Admitting: Oncology

## 2018-06-10 ENCOUNTER — Other Ambulatory Visit: Payer: Self-pay

## 2018-06-10 DIAGNOSIS — J181 Lobar pneumonia, unspecified organism: Secondary | ICD-10-CM | POA: Diagnosis not present

## 2018-06-10 DIAGNOSIS — R918 Other nonspecific abnormal finding of lung field: Secondary | ICD-10-CM

## 2018-06-10 LAB — POCT I-STAT CREATININE: Creatinine, Ser: 1.6 mg/dL — ABNORMAL HIGH (ref 0.61–1.24)

## 2018-06-10 MED ORDER — IOHEXOL 300 MG/ML  SOLN
75.0000 mL | Freq: Once | INTRAMUSCULAR | Status: AC | PRN
  Administered 2018-06-10: 60 mL via INTRAVENOUS

## 2018-06-11 ENCOUNTER — Ambulatory Visit: Payer: Medicare Other | Admitting: Family Medicine

## 2018-06-12 ENCOUNTER — Other Ambulatory Visit: Payer: Self-pay

## 2018-06-12 ENCOUNTER — Inpatient Hospital Stay: Payer: Medicare HMO | Attending: Oncology | Admitting: Oncology

## 2018-06-12 ENCOUNTER — Encounter: Payer: Self-pay | Admitting: Oncology

## 2018-06-12 VITALS — BP 114/68 | HR 81 | Temp 97.5°F | Wt 198.1 lb

## 2018-06-12 DIAGNOSIS — E042 Nontoxic multinodular goiter: Secondary | ICD-10-CM | POA: Diagnosis not present

## 2018-06-12 DIAGNOSIS — F0390 Unspecified dementia without behavioral disturbance: Secondary | ICD-10-CM | POA: Diagnosis not present

## 2018-06-12 DIAGNOSIS — Z79899 Other long term (current) drug therapy: Secondary | ICD-10-CM | POA: Insufficient documentation

## 2018-06-12 DIAGNOSIS — Z87891 Personal history of nicotine dependence: Secondary | ICD-10-CM

## 2018-06-12 DIAGNOSIS — Z7984 Long term (current) use of oral hypoglycemic drugs: Secondary | ICD-10-CM | POA: Diagnosis not present

## 2018-06-12 DIAGNOSIS — R918 Other nonspecific abnormal finding of lung field: Secondary | ICD-10-CM | POA: Insufficient documentation

## 2018-06-12 DIAGNOSIS — E119 Type 2 diabetes mellitus without complications: Secondary | ICD-10-CM | POA: Diagnosis not present

## 2018-06-12 DIAGNOSIS — Z923 Personal history of irradiation: Secondary | ICD-10-CM

## 2018-06-12 NOTE — Progress Notes (Signed)
Patient here today for follow up.  Patient states no new concerns today  

## 2018-08-11 ENCOUNTER — Ambulatory Visit: Payer: Medicare Other | Admitting: Radiation Oncology

## 2018-09-08 ENCOUNTER — Other Ambulatory Visit: Payer: Self-pay | Admitting: Family Medicine

## 2018-09-08 DIAGNOSIS — E119 Type 2 diabetes mellitus without complications: Secondary | ICD-10-CM

## 2018-09-08 NOTE — Telephone Encounter (Signed)
Please review

## 2018-09-18 ENCOUNTER — Ambulatory Visit: Payer: Self-pay | Admitting: Family Medicine

## 2018-11-11 ENCOUNTER — Other Ambulatory Visit: Payer: Self-pay

## 2018-12-05 NOTE — Progress Notes (Signed)
[facility]  [phone] [phone]) [phone]  ID: Johnston Ebbs OB: 1926-10-09  MR#: [id number]  YDX#:[id number]  Patient Care Team: Jerrol Banana., MD as PCP - General (Family Medicine) Lloyd Huger, MD as Consulting Physician (Oncology) Noreene Filbert, MD as Referring Physician (Radiation Oncology) Estill Cotta, MD (Ophthalmology)  CHIEF COMPLAINT: Right upper lobe lung mass.  INTERVAL HISTORY: Patient returns to clinic today for routine evaluation and discussion of his imaging results.  Given his underlying dementia, the entire history is given by his son.  He reports his dementia is getting worse and his performance status is declining. He has no neurologic complaints. He denies any recent fevers. He has a good appetite and denies weight loss. He denies any chest pain, shortness of breath, cough, or hemoptysis.  He has no nausea, vomiting, constipation, or diarrhea. He has no urinary complaints.  Patient/patient's son offers no further specific complaints today.  REVIEW OF SYSTEMS:   Review of Systems  Constitutional: Negative.  Negative for fever, malaise/fatigue and weight loss.  Respiratory: Negative.  Negative for cough, hemoptysis, sputum production and shortness of breath.   Cardiovascular: Negative.  Negative for chest pain and leg swelling.  Gastrointestinal: Negative.  Negative for abdominal pain.  Genitourinary: Negative.  Negative for dysuria.  Musculoskeletal: Negative.  Negative for back pain.  Skin: Negative.  Negative for rash.  Neurological: Negative.  Negative for focal weakness, weakness and headaches.  Psychiatric/Behavioral: Positive for memory loss. The patient is not nervous/anxious.     As per HPI. Otherwise, a complete review of systems is negative.  PAST MEDICAL HISTORY: Past Medical History:  Diagnosis Date  . 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 Dementia  ([place])   . Diabetes mellitus without complication ([place])   . Dyspnea   . Pneumonia   . Psoriasis   . Tuberculosis    per wife as a child    PAST SURGICAL HISTORY: Past Surgical History:  Procedure Laterality Date  . CATARACT EXTRACTION      FAMILY HISTORY: Family History  Problem Relation Age of Onset  . Heart disease Mother   . Dementia Mother   . Diabetes Father   . Heart disease Father   . Peripheral vascular disease Brother   . Diabetes Brother     ADVANCED DIRECTIVES (Y/N):  N  HEALTH MAINTENANCE: Social History   Tobacco Use  . Smoking status: Former Smoker    Quit date: 02/20/1976    Years since quitting: 42.8  . Smokeless tobacco: Never Used  Substance Use Topics  . Alcohol use: No  . Drug use: No     Colonoscopy:  PAP:  Bone density:  Lipid panel:  Allergies  Allergen Reactions  . Ciprofloxacin Anaphylaxis  . Hydrocodone-Acetaminophen Nausea And Vomiting  . Penicillins     rash  . Sulfa Antibiotics     Current Outpatient Medications  Medication Sig Dispense Refill  . Blood Glucose Monitoring Suppl (ACCU-CHEK AVIVA) device Use as instructed 1 each 0  . [employer]S LANCETS ULTRA-THIN 30G MISC USE 1 LANCET TO CHECK SUGAR TWICE A DAY 200 each 12  . glucose blood (ACCU-CHEK AVIVA) test strip 1 each by Other route daily. Use as instructed 100 each 12  . metFORMIN (GLUCOPHAGE) 1000 MG tablet TAKE 1 TABLET (1,000 MG TOTAL) BY MOUTH DAILY AFTER SUPPER. 90 tablet 3   No current facility-administered medications for this visit.     OBJECTIVE:  Vitals:   12/11/18 1139  BP: 123/66  Pulse: 79  Temp: 98 F (36.7 C)     Body mass index is 30.07 kg/m.    ECOG FS:1 - Symptomatic but completely ambulatory  General: Well-developed, well-nourished, no acute distress. Eyes: Pink conjunctiva, anicteric sclera. HEENT: Normocephalic, moist mucous membranes. Lungs: Clear to auscultation bilaterally. Heart: Regular rate and rhythm. No rubs, murmurs, or gallops.  Abdomen: Soft, nontender, nondistended. No organomegaly noted, normoactive bowel sounds. Musculoskeletal: No edema, cyanosis, or clubbing. Neuro: Alert, answering all questions appropriately. Cranial nerves grossly intact. Skin: No rashes or petechiae noted. Psych: Normal affect.  LAB RESULTS:  Lab Results  Component Value Date   NA 133 (L) 03/17/2018   K 4.6 03/17/2018   CL 99 03/17/2018   CO2 23 03/17/2018   GLUCOSE 293 (H) 03/17/2018   BUN 21 03/17/2018   CREATININE 1.40 (H) 12/10/2018   CALCIUM 8.8 (L) 03/17/2018   PROT 6.5 03/17/2018   ALBUMIN 3.7 03/17/2018   AST 32 03/17/2018   ALT 32 03/17/2018   ALKPHOS 57 03/17/2018   BILITOT 0.4 03/17/2018   GFRNONAA 50 (L) 03/17/2018   GFRAA 58 (L) 03/17/2018    Lab Results  Component Value Date   WBC 6.7 03/17/2018   NEUTROABS 4.1 03/17/2018   HGB 12.0 (L) 03/17/2018   HCT 35.3 (L) 03/17/2018   MCV 91.0 03/17/2018   PLT 218 03/17/2018     STUDIES: Ct Chest W Contrast  Result Date: 12/10/2018 CLINICAL DATA:  Follow-up right upper lobe lung mass, status post radiation therapy EXAM: CT CHEST WITH CONTRAST TECHNIQUE: Multidetector CT imaging of the chest was performed during intravenous contrast administration. CONTRAST:  62mL OMNIPAQUE IOHEXOL 300 MG/ML  SOLN COMPARISON:  06/10/2018, 12/05/2017, PET-CT, 06/24/2017 FINDINGS: Cardiovascular: Aortic atherosclerosis. Normal heart size. Three-vessel coronary artery calcifications. No pericardial effusion. Mediastinum/Nodes: No change in small right hilar and mediastinal lymph nodes, some of which are calcified, the largest node measuring approximately 10 mm in short axis (series 2, image 58). Redemonstrated small hypodense nodules of the thyroid gland. Trachea, and esophagus demonstrate no significant findings. Lungs/Pleura: There has been interval enlargement of an irregular mass of the suprahilar right upper lobe, which measures approximately 5.6 x 4.2 cm, previously 5.5 x 2.2 cm when  measured similarly (series 2, image 44). There is likely a small degree of postobstructive airspace disease of the right upper lobe. Unchanged, likely incidental and benign infectious or inflammatory small solid and ground-glass nodules elsewhere, a left lower lobe ground-glass nodule measuring 12 mm (series 3, image 107) and a solid nodule of right lower lobe measuring 3 mm (series 3, image 119). No pleural effusion or pneumothorax. Upper Abdomen: No acute abnormality.  Hepatic steatosis. Musculoskeletal: No chest wall mass or suspicious bone lesions identified. IMPRESSION: 1. There has been interval enlargement of an irregular mass of the suprahilar right upper lobe, which measures approximately 5.6 x 4.2 cm, previously 5.5 x 2.2 cm when measured similarly (series 2, image 44). There is likely a small degree of postobstructive airspace disease of the right upper lobe. Findings are consistent with progression of malignancy. 2. No change in small right hilar and mediastinal lymph nodes, some of which are calcified, the largest node measuring approximately 10 mm in short axis (series 2, image 58). 3. Unchanged, likely incidental and benign infectious or inflammatory small solid and ground-glass nodules elsewhere, a left lower lobe ground-glass nodule measuring 12 mm (series 3, image 107) and a solid nodule of right lower lobe  measuring 3 mm (series 3, image 119). Attention on follow-up. 4.  Coronary artery disease and aortic atherosclerosis. Electronically Signed   By: Eddie Candle M.D.   On: 12/10/2018 11:58    ASSESSMENT: Right upper lobe lung mass.  PLAN:    1. Right upper lobe lung mass: Although no biopsy was able to be performed, given the continued growth this is highly suspicious for underlying malignancy and patient underwent palliative XRT completing in June 2019.  CT scan results from December 10, 2018 reviewed independently and report as above with interval enlargement of the right upper lobe mass.   This is likely progression of disease.  After lengthy discussion with the son, the family is unclear if they wish to pursue any further imaging.  They are also unclear if they would pursue additional XRT.  Chemotherapy is not an option given his underlying dementia.  Return to clinic in 3 months for further evaluation at which point we will decide if further imaging is necessary.    I spent a total of 30 minutes face-to-face with the patient of which greater than 50% of the visit was spent in counseling and coordination of care as detailed above.   Patient expressed understanding and was in agreement with this plan. He also understands that He can call clinic at any time with any questions, concerns, or complaints.   Cancer Staging No matching staging information was found for the patient.  Lloyd Huger, MD   12/11/2018 7:00 PM

## 2018-12-10 ENCOUNTER — Other Ambulatory Visit: Payer: Self-pay

## 2018-12-10 ENCOUNTER — Ambulatory Visit
Admission: RE | Admit: 2018-12-10 | Discharge: 2018-12-10 | Disposition: A | Discharge status: Home / Self Care | Payer: Medicare HMO | Source: Ambulatory Visit | Attending: Oncology | Admitting: Oncology

## 2018-12-10 ENCOUNTER — Encounter: Payer: Self-pay | Admitting: Oncology

## 2018-12-10 DIAGNOSIS — R918 Other nonspecific abnormal finding of lung field: Secondary | ICD-10-CM | POA: Diagnosis not present

## 2018-12-10 LAB — POCT I-STAT CREATININE: Creatinine, Ser: 1.4 mg/dL — ABNORMAL HIGH (ref 0.61–1.24)

## 2018-12-10 MED ORDER — IOHEXOL 300 MG/ML  SOLN
75.0000 mL | Freq: Once | INTRAMUSCULAR | Status: AC | PRN
  Administered 2018-12-10: 09:00:00 75 mL via INTRAVENOUS

## 2018-12-10 NOTE — Progress Notes (Signed)
Pre screening completed no complaints today.

## 2018-12-11 ENCOUNTER — Other Ambulatory Visit: Payer: Self-pay

## 2018-12-11 ENCOUNTER — Inpatient Hospital Stay: Payer: Medicare HMO | Attending: Oncology | Admitting: Oncology

## 2018-12-11 VITALS — BP 123/66 | HR 79 | Temp 98.0°F | Wt 192.0 lb

## 2018-12-11 DIAGNOSIS — F0390 Unspecified dementia without behavioral disturbance: Secondary | ICD-10-CM | POA: Diagnosis not present

## 2018-12-11 DIAGNOSIS — I700 Atherosclerosis of aorta: Secondary | ICD-10-CM | POA: Insufficient documentation

## 2018-12-11 DIAGNOSIS — I2510 Atherosclerotic heart disease of native coronary artery without angina pectoris: Secondary | ICD-10-CM | POA: Insufficient documentation

## 2018-12-11 DIAGNOSIS — E042 Nontoxic multinodular goiter: Secondary | ICD-10-CM | POA: Insufficient documentation

## 2018-12-11 DIAGNOSIS — Z87891 Personal history of nicotine dependence: Secondary | ICD-10-CM | POA: Diagnosis not present

## 2018-12-11 DIAGNOSIS — E119 Type 2 diabetes mellitus without complications: Secondary | ICD-10-CM | POA: Diagnosis not present

## 2018-12-11 DIAGNOSIS — R918 Other nonspecific abnormal finding of lung field: Secondary | ICD-10-CM | POA: Diagnosis not present

## 2018-12-11 DIAGNOSIS — Z7984 Long term (current) use of oral hypoglycemic drugs: Secondary | ICD-10-CM | POA: Insufficient documentation

## 2019-01-15 ENCOUNTER — Ambulatory Visit (INDEPENDENT_AMBULATORY_CARE_PROVIDER_SITE_OTHER): Payer: Medicare HMO | Admitting: Family Medicine

## 2019-01-15 ENCOUNTER — Other Ambulatory Visit: Payer: Self-pay

## 2019-01-15 ENCOUNTER — Encounter: Payer: Self-pay | Admitting: Family Medicine

## 2019-01-15 VITALS — BP 128/72 | HR 78 | Temp 97.1°F | Resp 18 | Wt 192.0 lb

## 2019-01-15 DIAGNOSIS — Z23 Encounter for immunization: Secondary | ICD-10-CM

## 2019-01-15 DIAGNOSIS — E039 Hypothyroidism, unspecified: Secondary | ICD-10-CM

## 2019-01-15 DIAGNOSIS — E7800 Pure hypercholesterolemia, unspecified: Secondary | ICD-10-CM | POA: Diagnosis not present

## 2019-01-15 DIAGNOSIS — E119 Type 2 diabetes mellitus without complications: Secondary | ICD-10-CM | POA: Diagnosis not present

## 2019-01-15 DIAGNOSIS — F02818 Dementia in other diseases classified elsewhere, unspecified severity, with other behavioral disturbance: Secondary | ICD-10-CM

## 2019-01-15 DIAGNOSIS — F0281 Dementia in other diseases classified elsewhere with behavioral disturbance: Secondary | ICD-10-CM

## 2019-01-15 DIAGNOSIS — R918 Other nonspecific abnormal finding of lung field: Secondary | ICD-10-CM | POA: Diagnosis not present

## 2019-01-15 DIAGNOSIS — G301 Alzheimer's disease with late onset: Secondary | ICD-10-CM

## 2019-01-15 NOTE — Progress Notes (Signed)
Patient: Joel [facility] Male    DOB: May 05, 1926   85 y.o.   MRN: [id number] Visit Date: 01/15/2019  Today's Provider: Wilhemena Durie, MD   Chief Complaint  Patient presents with  . Diabetes   Subjective:     HPI  Allergies  Allergen Reactions  . Ciprofloxacin Anaphylaxis  . Hydrocodone-Acetaminophen Nausea And Vomiting  . Penicillins     rash  . Sulfa Antibiotics      Current Outpatient Medications:  .  Blood Glucose Monitoring Suppl (ACCU-CHEK AVIVA) device, Use as instructed, Disp: 1 each, Rfl: 0 .  [employer]S LANCETS ULTRA-THIN 30G MISC, USE 1 LANCET TO CHECK SUGAR TWICE A DAY, Disp: 200 each, Rfl: 12 .  glucose blood (ACCU-CHEK AVIVA) test strip, 1 each by Other route daily. Use as instructed, Disp: 100 each, Rfl: 12 .  metFORMIN (GLUCOPHAGE) 1000 MG tablet, TAKE 1 TABLET (1,000 MG TOTAL) BY MOUTH DAILY AFTER SUPPER., Disp: 90 tablet, Rfl: 3  Review of Systems  Constitutional: Negative.  Negative for fatigue.  HENT: Negative.   Eyes: Negative.   Respiratory: Negative.   Cardiovascular: Negative.  Negative for chest pain.  Gastrointestinal: Negative.   Endocrine: Negative for cold intolerance, heat intolerance, polydipsia, polyphagia and polyuria.  Musculoskeletal: Positive for back pain. Negative for arthralgias, gait problem, joint swelling, myalgias, neck pain and neck stiffness.  Allergic/Immunologic: Negative.   Neurological: Negative for dizziness, weakness, light-headedness and headaches.  Psychiatric/Behavioral: Positive for confusion.    Social History   Tobacco Use  . Smoking status: Former Smoker    Quit date: 02/20/1976    Years since quitting: 42.9  . Smokeless tobacco: Never Used  Substance Use Topics  . Alcohol use: No      Objective:   BP 128/72 (BP Location: Left Arm, Patient Position: Sitting, Cuff Size: Large)   Pulse 78   Temp (!) 97.1 F (36.2 C) (Temporal)   Resp 18   Wt 192 lb (87.1 kg)   SpO2 98%   BMI 30.07 kg/m   Vitals:   01/15/19 1048  BP: 128/72  Pulse: 78  Resp: 18  Temp: (!) 97.1 F (36.2 C)  TempSrc: Temporal  SpO2: 98%  Weight: 192 lb (87.1 kg)  Body mass index is 30.07 kg/m.   Physical Exam Vitals signs reviewed.  Constitutional:      Appearance: He is well-developed.  HENT:     Head: Normocephalic and atraumatic.     Right Ear: External ear normal.     Left Ear: External ear normal.     Nose: Nose normal.  Eyes:     General: No scleral icterus. Neck:     Thyroid: No thyromegaly.  Cardiovascular:     Rate and Rhythm: Normal rate and regular rhythm.     Heart sounds: Normal heart sounds.  Pulmonary:     Effort: Pulmonary effort is normal.     Breath sounds: Normal breath sounds.  Abdominal:     Palpations: Abdomen is soft.  Skin:    General: Skin is warm and dry.  Neurological:     General: No focal deficit present.     Mental Status: He is alert.  Psychiatric:        Mood and Affect: Mood normal.      No results found for any visits on 01/15/19.     Assessment & Plan    1. Late onset Alzheimer's disease with behavioral disturbance (HCC) Progressive.. Wife fatigued. - CBC w/Diff/Platelet -  TSH  2. Adult hypothyroidism  - Comp. Metabolic Panel (12) - TSH  3. Diabetes mellitus without complication (HCC)  - HgB A1c - Comp. Metabolic Panel (12) - TSH  4. Mass of upper lobe of right lung Per oncology - CBC w/Diff/Platelet - Comp. Metabolic Panel (12)  5. Hypercholesterolemia without hypertriglyceridemia  - Comp. Metabolic Panel (12)  6. Need for influenza vaccination  - Flu Vaccine QUAD High Dose(Fluad)     Wilhemena Durie, MD  [facility]

## 2019-01-16 LAB — CBC WITH DIFFERENTIAL/PLATELET
Basophils Absolute: 0 10*3/uL (ref 0.0–0.2)
Basos: 0 %
EOS (ABSOLUTE): 0.3 10*3/uL (ref 0.0–0.4)
Eos: 3 %
Hematocrit: 38.2 % (ref 37.5–51.0)
Hemoglobin: 12.9 g/dL — ABNORMAL LOW (ref 13.0–17.7)
Immature Grans (Abs): 0 10*3/uL (ref 0.0–0.1)
Immature Granulocytes: 0 %
Lymphocytes Absolute: 2 10*3/uL (ref 0.7–3.1)
Lymphs: 23 %
MCH: 29.7 pg (ref 26.6–33.0)
MCHC: 33.8 g/dL (ref 31.5–35.7)
MCV: 88 fL (ref 79–97)
Monocytes Absolute: 0.7 10*3/uL (ref 0.1–0.9)
Monocytes: 8 %
Neutrophils Absolute: 5.9 10*3/uL (ref 1.4–7.0)
Neutrophils: 66 %
Platelets: 238 10*3/uL (ref 150–450)
RBC: 4.34 x10E6/uL (ref 4.14–5.80)
RDW: 13.2 % (ref 11.6–15.4)
WBC: 9 10*3/uL (ref 3.4–10.8)

## 2019-01-16 LAB — COMP. METABOLIC PANEL (12)
AST: 16 IU/L (ref 0–40)
Albumin/Globulin Ratio: 1.9 (ref 1.2–2.2)
Albumin: 4.1 g/dL (ref 3.6–4.6)
Alkaline Phosphatase: 63 IU/L (ref 39–117)
BUN/Creatinine Ratio: 15 (ref 10–24)
BUN: 21 mg/dL (ref 8–27)
Bilirubin Total: 0.3 mg/dL (ref 0.0–1.2)
Calcium: 9.3 mg/dL (ref 8.6–10.2)
Chloride: 99 mmol/L (ref 96–106)
Creatinine, Ser: 1.39 mg/dL — ABNORMAL HIGH (ref 0.76–1.27)
GFR calc Af Amer: 52 mL/min/{1.73_m2} — ABNORMAL LOW (ref >59)
GFR calc non Af Amer: 45 mL/min/{1.73_m2} — ABNORMAL LOW (ref >59)
Globulin, Total: 2.2 g/dL (ref 1.5–4.5)
Glucose: 218 mg/dL — ABNORMAL HIGH (ref 65–99)
Potassium: 5 mmol/L (ref 3.5–5.2)
Sodium: 135 mmol/L (ref 134–144)
Total Protein: 6.3 g/dL (ref 6.0–8.5)

## 2019-01-16 LAB — HEMOGLOBIN A1C
Est. average glucose Bld gHb Est-mCnc: 171 mg/dL
Hgb A1c MFr Bld: 7.6 % — ABNORMAL HIGH (ref 4.8–5.6)

## 2019-01-16 LAB — TSH: TSH: 1.9 u[IU]/mL (ref 0.450–4.500)

## 2019-02-11 ENCOUNTER — Encounter: Payer: Self-pay | Admitting: Physician Assistant

## 2019-02-11 ENCOUNTER — Other Ambulatory Visit: Payer: Self-pay

## 2019-02-11 ENCOUNTER — Ambulatory Visit (INDEPENDENT_AMBULATORY_CARE_PROVIDER_SITE_OTHER): Payer: Medicare HMO | Admitting: Physician Assistant

## 2019-02-11 VITALS — BP 136/73 | HR 77 | Temp 96.8°F | Resp 16 | Wt 192.0 lb

## 2019-02-11 DIAGNOSIS — R208 Other disturbances of skin sensation: Secondary | ICD-10-CM | POA: Diagnosis not present

## 2019-02-11 DIAGNOSIS — C44329 Squamous cell carcinoma of skin of other parts of face: Secondary | ICD-10-CM | POA: Diagnosis not present

## 2019-02-11 DIAGNOSIS — R300 Dysuria: Secondary | ICD-10-CM

## 2019-02-11 DIAGNOSIS — R350 Frequency of micturition: Secondary | ICD-10-CM | POA: Diagnosis not present

## 2019-02-11 DIAGNOSIS — D485 Neoplasm of uncertain behavior of skin: Secondary | ICD-10-CM | POA: Diagnosis not present

## 2019-02-11 LAB — POCT URINALYSIS DIPSTICK
Bilirubin, UA: NEGATIVE
Blood, UA: NEGATIVE
Glucose, UA: POSITIVE — AB
Ketones, UA: NEGATIVE
Leukocytes, UA: NEGATIVE
Nitrite, UA: NEGATIVE
Protein, UA: POSITIVE — AB
Spec Grav, UA: <=1.005 — AB (ref 1.010–1.025)
Urobilinogen, UA: 0.2 E.U./dL
pH, UA: 7 (ref 5.0–8.0)

## 2019-02-11 NOTE — Patient Instructions (Signed)

## 2019-02-11 NOTE — Progress Notes (Signed)
Patient: Joel Grant Male    DOB: Dec 08, 1926   85 y.o.   MRN: [id number] Visit Date: 02/11/2019  Today's Provider: Trinna Post, PA-C   Chief Complaint  Patient presents with  . Dysuria   Subjective:    Joel Grant is a 85 year old man with late stage Alzheimer's who presents today with c/o possible UTI. He presents with his wife who supplies the history. She reports yesterday he seemed to have some increased frequency but no other symptoms. Today the symptoms have seemed to improve.   Dysuria  This is a new problem. The current episode started yesterday. The problem has been unchanged. The patient is experiencing no pain. There has been no fever. Associated symptoms include frequency and urgency. Pertinent negatives include no chills, discharge, flank pain, hematuria, hesitancy, nausea, possible pregnancy, sweats or vomiting. He has tried nothing for the symptoms.    Allergies  Allergen Reactions  . Ciprofloxacin Anaphylaxis  . Hydrocodone-Acetaminophen Nausea And Vomiting  . Penicillins     rash  . Sulfa Antibiotics      Current Outpatient Medications:  .  Blood Glucose Monitoring Suppl (ACCU-CHEK AVIVA) device, Use as instructed, Disp: 1 each, Rfl: 0 .  [employer] LANCETS ULTRA-THIN 30G MISC, USE 1 LANCET TO CHECK SUGAR TWICE A DAY, Disp: 200 each, Rfl: 12 .  glucose blood (ACCU-CHEK AVIVA) test strip, 1 each by Other route daily. Use as instructed, Disp: 100 each, Rfl: 12 .  metFORMIN (GLUCOPHAGE) 1000 MG tablet, TAKE 1 TABLET (1,000 MG TOTAL) BY MOUTH DAILY AFTER SUPPER., Disp: 90 tablet, Rfl: 3  Review of Systems  Constitutional: Negative.  Negative for chills.  Gastrointestinal: Negative.  Negative for nausea and vomiting.  Genitourinary: Positive for dysuria, frequency and urgency. Negative for flank pain, hematuria and hesitancy.    Social History   Tobacco Use  . Smoking status: Former Smoker    Quit date: 02/20/1976    Years since quitting: 43.0  .  Smokeless tobacco: Never Used  Substance Use Topics  . Alcohol use: No      Objective:   BP 136/73   Pulse 77   Temp (!) 96.8 F (36 C) (Oral)   Resp 16   Wt 192 lb (87.1 kg)   BMI 30.07 kg/m  Vitals:   02/11/19 1455  BP: 136/73  Pulse: 77  Resp: 16  Temp: (!) 96.8 F (36 C)  TempSrc: Oral  Weight: 192 lb (87.1 kg)  Body mass index is 30.07 kg/m.   Physical Exam Constitutional:      Appearance: Normal appearance.  Cardiovascular:     Rate and Rhythm: Normal rate and regular rhythm.     Heart sounds: Normal heart sounds.  Pulmonary:     Effort: Pulmonary effort is normal.     Breath sounds: Normal breath sounds.  Abdominal:     General: Bowel sounds are normal.     Palpations: Abdomen is soft.     Tenderness: There is no right CVA tenderness or left CVA tenderness.  Skin:    General: Skin is warm and dry.  Neurological:     Mental Status: He is alert and oriented to person, place, and time. Mental status is at baseline.  Psychiatric:        Mood and Affect: Mood normal.        Behavior: Behavior normal.      No results found for any visits on 02/11/19.  Assessment & Plan    1. Dysuria  Urine dipstick negative for blood, leukocytes and nitrites. Will send for culture. Will await culture before starting antibiotics. Counseled on return precautions.   - POCT urinalysis dipstick - CULTURE, URINE COMPREHENSIVE  2. Urinary frequency  - CULTURE, URINE COMPREHENSIVE  The entirety of the information documented in the History of Present Illness, Review of Systems and Physical Exam were personally obtained by me. Portions of this information were initially documented by [facility], CMA and reviewed by me for thoroughness and accuracy.   F/u PRN       Trinna Post, PA-C  [facility]

## 2019-02-15 LAB — CULTURE, URINE COMPREHENSIVE

## 2019-02-16 ENCOUNTER — Telehealth: Payer: Self-pay

## 2019-02-16 NOTE — Telephone Encounter (Signed)
Patient's wife Stanton Kidney was advised per DRP.

## 2019-02-16 NOTE — Telephone Encounter (Signed)
-----   Message from Trinna Post, [place] sent at 02/13/2019  3:16 PM EST ----- Looks like his urine culture is growing out a contaminant and not an infection. It is not finalized but I wanted to touch base with the family prior to the weekend.

## 2019-03-11 ENCOUNTER — Ambulatory Visit: Payer: Medicare HMO | Admitting: Oncology

## 2019-05-16 NOTE — Progress Notes (Signed)
  [place]  Telephone:(336) [phone] Fax:(336) [phone]  ID: Johnston Ebbs OB: 1926-11-06  MR#: [id number]  MQT#:[id number]   Lloyd Huger, MD   05/20/2019 6:56 AM    This encounter was created in error - please disregard.

## 2019-05-18 ENCOUNTER — Other Ambulatory Visit: Payer: Self-pay

## 2019-05-18 ENCOUNTER — Encounter: Payer: Self-pay | Admitting: Oncology

## 2019-05-19 ENCOUNTER — Inpatient Hospital Stay: Payer: Medicare HMO | Admitting: Oncology

## 2019-05-22 ENCOUNTER — Other Ambulatory Visit: Payer: Self-pay

## 2019-05-22 ENCOUNTER — Encounter: Payer: Self-pay | Admitting: Oncology

## 2019-05-22 ENCOUNTER — Inpatient Hospital Stay: Payer: Medicare HMO | Attending: Oncology | Admitting: Oncology

## 2019-05-22 DIAGNOSIS — R918 Other nonspecific abnormal finding of lung field: Secondary | ICD-10-CM

## 2019-05-22 NOTE — Progress Notes (Signed)
Patient prescreened for appointment. Patient has no concerns or questions.  

## 2019-05-22 NOTE — Progress Notes (Signed)
[place]  Telephone:([phone] Fax:(336) [phone]  ID: Joel Grant OB: [birth year]  MR#: [id number]  TKZ#:[id number]  Patient Care Team: Jerrol Banana., MD as PCP - General (Family Medicine) Lloyd Huger, MD as Consulting Physician (Oncology) Noreene Filbert, MD as Referring Physician (Radiation Oncology) Dingeldein, Remo Lipps, MD (Ophthalmology)  I connected with Joel Grant on 05/22/19 at  1:30 PM EST by video enabled telemedicine visit and verified that I am speaking with the correct person using two identifiers.   I discussed the limitations, risks, security and privacy concerns of performing an evaluation and management service by telemedicine and the availability of in-person appointments. I also discussed with the patient that there may be a patient responsible charge related to this service. The patient expressed understanding and agreed to proceed.   Other persons participating in the visit and their role in the encounter: Patient's son, patient's wife, MD.  Patient's location: Home. Provider's location: Clinic.  CHIEF COMPLAINT: Right upper lobe lung mass.  INTERVAL HISTORY: Patient's family history to video enabled telemedicine visit to further discuss whether additional imaging was necessary or desired.  Patient remained asleep throughout the entire video visit.  His son and wife report that his dementia is approximately baseline, but he spends much of the day sleeping in the chair. He has no neurologic complaints. He denies any recent fevers. He has a good appetite and denies weight loss. He denies any chest pain, shortness of breath, cough, or hemoptysis.  He has no nausea, vomiting, constipation, or diarrhea. He has no urinary complaints.  Patient's wife and son did not report any further complaints.  REVIEW OF SYSTEMS:   Review of Systems  Unable to perform ROS: Dementia   PAST MEDICAL HISTORY: Past Medical History:  Diagnosis  Date  . 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 Dementia ([place])   . Diabetes mellitus without complication ([place])   . Dyspnea   . Pneumonia   . Psoriasis   . Tuberculosis    per wife as a child    PAST SURGICAL HISTORY: Past Surgical History:  Procedure Laterality Date  . CATARACT EXTRACTION      FAMILY HISTORY: Family History  Problem Relation Age of Onset  . Heart disease Mother   . Dementia Mother   . Diabetes Father   . Heart disease Father   . Peripheral vascular disease Brother   . Diabetes Brother     ADVANCED DIRECTIVES (Y/N):  N  HEALTH MAINTENANCE: Social History   Tobacco Use  . Smoking status: Former Smoker    Quit date: 02/20/1976    Years since quitting: 43.2  . Smokeless tobacco: Never Used  Substance Use Topics  . Alcohol use: No  . Drug use: No     Colonoscopy:  PAP:  Bone density:  Lipid panel:  Allergies  Allergen Reactions  . Ciprofloxacin Anaphylaxis  . Hydrocodone-Acetaminophen Nausea And Vomiting  . Penicillins     rash  . Sulfa Antibiotics     Current Outpatient Medications  Medication Sig Dispense Refill  . CVS LANCETS ULTRA-THIN 30G MISC USE 1 LANCET TO CHECK SUGAR TWICE A DAY 200 each 12  . glucose blood (ACCU-CHEK AVIVA) test strip 1 each by Other route daily. Use as instructed 100 each 12  . metFORMIN (GLUCOPHAGE) 1000 MG tablet TAKE 1 TABLET (1,000 MG TOTAL) BY MOUTH DAILY AFTER SUPPER. 90 tablet 3   No current facility-administered  medications for this visit.    OBJECTIVE: Vitals:     There is no height or weight on file to calculate BMI.    ECOG FS:3 - Symptomatic, >50% confined to bed  LAB RESULTS:  Lab Results  Component Value Date   NA 135 01/15/2019   K 5.0 01/15/2019   CL 99 01/15/2019   CO2 23 03/17/2018   GLUCOSE 218 (H) 01/15/2019   BUN 21 01/15/2019   CREATININE 1.39 (H) 01/15/2019   CALCIUM 9.3 01/15/2019   PROT 6.3 01/15/2019   ALBUMIN 4.1 01/15/2019    AST 16 01/15/2019   ALT 32 03/17/2018   ALKPHOS 63 01/15/2019   BILITOT 0.3 01/15/2019   GFRNONAA 45 (L) 01/15/2019   GFRAA 52 (L) 01/15/2019    Lab Results  Component Value Date   WBC 9.0 01/15/2019   NEUTROABS 5.9 01/15/2019   HGB 12.9 (L) 01/15/2019   HCT 38.2 01/15/2019   MCV 88 01/15/2019   PLT 238 01/15/2019     STUDIES: No results found.  ASSESSMENT: Right upper lobe lung mass.  PLAN:    1. Right upper lobe lung mass: Although no biopsy was able to be performed, given the continued growth this is highly suspicious for underlying malignancy and patient underwent palliative XRT completing in June 2019.  CT scan results from December 10, 2018 reviewed independently with interval enlargement of the right upper lobe mass.  This is likely progression of disease.  After additional discussion with patient's son and wife, they wish to have another surveillance CT scan in March 2021 to further assess interval change.  Will have video assisted telemedicine visit 1 to 2 days after to discuss the results.  They have expressed interest in possibly pursuing additional XRT, but understand that surgery or chemotherapy is not an option given his underlying dementia.    I provided 20 minutes of face-to-face video visit time during this encounter which included chart review, counseling, and coordination of care as documented above.   Patient expressed understanding and was in agreement with this plan. He also understands that He can call clinic at any time with any questions, concerns, or complaints.   Cancer Staging No matching staging information was found for the patient.  Lloyd Huger, MD   05/22/2019 3:45 PM

## 2019-06-12 NOTE — Progress Notes (Deleted)
[place]  Telephone:([phone] Fax:(336) [phone]  ID: Joel Grant OB: 1927-02-17  MR#: [id number]  CWC#:[id number]  Patient Care Team: Jerrol Banana., MD as PCP - General (Family Medicine) Lloyd Huger, MD as Consulting Physician (Oncology) Noreene Filbert, MD as Referring Physician (Radiation Oncology) Estill Cotta, MD (Ophthalmology)  I connected with Joel Grant on 06/12/19 at  2:45 PM EDT by {Blank single:19197::"video enabled telemedicine visit","telephone visit"} and verified that I am speaking with the correct person using two identifiers.   I discussed the limitations, risks, security and privacy concerns of performing an evaluation and management service by telemedicine and the availability of in-person appointments. I also discussed with the patient that there may be a patient responsible charge related to this service. The patient expressed understanding and agreed to proceed.   Other persons participating in the visit and their role in the encounter: Patient, MD.  Patient's location: Home. Provider's location: Clinic.  CHIEF COMPLAINT: Right upper lobe lung mass.  INTERVAL HISTORY: Patient's family history to video enabled telemedicine visit to further discuss whether additional imaging was necessary or desired.  Patient remained asleep throughout the entire video visit.  His son and wife report that his dementia is approximately baseline, but he spends much of the day sleeping in the chair. He has no neurologic complaints. He denies any recent fevers. He has a good appetite and denies weight loss. He denies any chest pain, shortness of breath, cough, or hemoptysis.  He has no nausea, vomiting, constipation, or diarrhea. He has no urinary complaints.  Patient's wife and son did not report any further complaints.  REVIEW OF SYSTEMS:   Review of Systems  Unable to perform ROS: Dementia   PAST MEDICAL HISTORY: Past Medical  History:  Diagnosis Date  . 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 Dementia ([place])   . Diabetes mellitus without complication ([place])   . Dyspnea   . Pneumonia   . Psoriasis   . Tuberculosis    per wife as a child    PAST SURGICAL HISTORY: Past Surgical History:  Procedure Laterality Date  . CATARACT EXTRACTION      FAMILY HISTORY: Family History  Problem Relation Age of Onset  . Heart disease Mother   . Dementia Mother   . Diabetes Father   . Heart disease Father   . Peripheral vascular disease Brother   . Diabetes Brother     ADVANCED DIRECTIVES (Y/N):  N  HEALTH MAINTENANCE: Social History   Tobacco Use  . Smoking status: Former Smoker    Quit date: 02/20/1976    Years since quitting: 43.3  . Smokeless tobacco: Never Used  Substance Use Topics  . Alcohol use: No  . Drug use: No     Colonoscopy:  PAP:  Bone density:  Lipid panel:  Allergies  Allergen Reactions  . Ciprofloxacin Anaphylaxis  . Hydrocodone-Acetaminophen Nausea And Vomiting  . Penicillins     rash  . Sulfa Antibiotics     Current Outpatient Medications  Medication Sig Dispense Refill  . CVS LANCETS ULTRA-THIN 30G MISC USE 1 LANCET TO CHECK SUGAR TWICE A DAY 200 each 12  . glucose blood (ACCU-CHEK AVIVA) test strip 1 each by Other route daily. Use as instructed 100 each 12  . metFORMIN (GLUCOPHAGE) 1000 MG tablet TAKE 1 TABLET (1,000 MG TOTAL) BY MOUTH DAILY AFTER SUPPER. 90 tablet 3   No current facility-administered medications  for this visit.    OBJECTIVE: There were no vitals filed for this visit.   There is no height or weight on file to calculate BMI.    ECOG FS:3 - Symptomatic, >50% confined to bed  LAB RESULTS:  Lab Results  Component Value Date   NA 135 01/15/2019   K 5.0 01/15/2019   CL 99 01/15/2019   CO2 23 03/17/2018   GLUCOSE 218 (H) 01/15/2019   BUN 21 01/15/2019   CREATININE 1.39 (H) 01/15/2019   CALCIUM 9.3 01/15/2019     PROT 6.3 01/15/2019   ALBUMIN 4.1 01/15/2019   AST 16 01/15/2019   ALT 32 03/17/2018   ALKPHOS 63 01/15/2019   BILITOT 0.3 01/15/2019   GFRNONAA 45 (L) 01/15/2019   GFRAA 52 (L) 01/15/2019    Lab Results  Component Value Date   WBC 9.0 01/15/2019   NEUTROABS 5.9 01/15/2019   HGB 12.9 (L) 01/15/2019   HCT 38.2 01/15/2019   MCV 88 01/15/2019   PLT 238 01/15/2019     STUDIES: No results found.  ASSESSMENT: Right upper lobe lung mass.  PLAN:    1. Right upper lobe lung mass: Although no biopsy was able to be performed, given the continued growth this is highly suspicious for underlying malignancy and patient underwent palliative XRT completing in June 2019.  CT scan results from December 10, 2018 reviewed independently with interval enlargement of the right upper lobe mass.  This is likely progression of disease.  After additional discussion with patient's son and wife, they wish to have another surveillance CT scan in March 2021 to further assess interval change.  Will have video assisted telemedicine visit 1 to 2 days after to discuss the results.  They have expressed interest in possibly pursuing additional XRT, but understand that surgery or chemotherapy is not an option given his underlying dementia.    I provided *** minutes of {Blank single:19197::"face-to-face video visit time","non face-to-face telephone visit time"} during this encounter which included chart review, counseling, and coordination of care as documented above.   Patient expressed understanding and was in agreement with this plan. He also understands that He can call clinic at any time with any questions, concerns, or complaints.   Cancer Staging No matching staging information was found for the patient.  Lloyd Huger, MD   06/12/2019 12:15 PM

## 2019-06-16 ENCOUNTER — Ambulatory Visit: Payer: Medicare HMO

## 2019-06-16 ENCOUNTER — Other Ambulatory Visit: Payer: Self-pay

## 2019-06-16 ENCOUNTER — Ambulatory Visit
Admission: RE | Admit: 2019-06-16 | Discharge: 2019-06-16 | Disposition: A | Discharge status: Home / Self Care | Payer: Medicare HMO | Source: Ambulatory Visit | Attending: Oncology | Admitting: Oncology

## 2019-06-16 DIAGNOSIS — R918 Other nonspecific abnormal finding of lung field: Secondary | ICD-10-CM | POA: Diagnosis not present

## 2019-06-16 DIAGNOSIS — C3490 Malignant neoplasm of unspecified part of unspecified bronchus or lung: Secondary | ICD-10-CM | POA: Diagnosis not present

## 2019-06-16 LAB — POCT I-STAT CREATININE: Creatinine, Ser: 1.3 mg/dL — ABNORMAL HIGH (ref 0.61–1.24)

## 2019-06-16 MED ORDER — IOHEXOL 300 MG/ML  SOLN
60.0000 mL | Freq: Once | INTRAMUSCULAR | Status: AC | PRN
  Administered 2019-06-16: 60 mL via INTRAVENOUS

## 2019-06-16 MED ORDER — IOHEXOL 300 MG/ML  SOLN: 75.0000 mL | Freq: Once | INTRAMUSCULAR | Status: DC | PRN

## 2019-06-18 ENCOUNTER — Telehealth: Payer: Medicare HMO | Admitting: Oncology

## 2019-06-18 ENCOUNTER — Encounter: Payer: Self-pay | Admitting: Oncology

## 2019-06-18 ENCOUNTER — Inpatient Hospital Stay: Payer: Medicare HMO | Attending: Oncology | Admitting: Oncology

## 2019-06-18 ENCOUNTER — Other Ambulatory Visit: Payer: Self-pay

## 2019-06-18 DIAGNOSIS — R918 Other nonspecific abnormal finding of lung field: Secondary | ICD-10-CM

## 2019-06-18 NOTE — Progress Notes (Signed)
[place]  Telephone:(336) [phone] Fax:(336) [phone]  ID: Joel Grant OB: Jan 05, 1927  MR#: [id number]  IWL#:[id number]  Patient Care Team: Jerrol Banana., MD as PCP - General (Family Medicine) Lloyd Huger, MD as Consulting Physician (Oncology) Noreene Filbert, MD as Referring Physician (Radiation Oncology) Estill Cotta, MD (Ophthalmology)  I connected with Joel Grant on 06/18/19 at  3:30 PM EDT by video enabled telemedicine visit and verified that I am speaking with the correct person using two identifiers.   I discussed the limitations, risks, security and privacy concerns of performing an evaluation and management service by telemedicine and the availability of in-person appointments. I also discussed with the patient that there may be a patient responsible charge related to this service. The patient expressed understanding and agreed to proceed.   Other persons participating in the visit and their role in the encounter: Patient, patient's son and wife, MD.  Patient's location: Home. Provider's location: Clinic.  CHIEF COMPLAINT: Right upper lobe lung mass.  INTERVAL HISTORY: Patient's son and wife agreed to video enabled telemedicine visit for further evaluation and discussion of his imaging results.  Given patient's underlying dementia, the entire history is given by his family.  He currently is alert and sitting up in the chair. His son and wife report that his dementia is approximately baseline, but he spends much of the day sleeping in the chair. He has no neurologic complaints.  There is no report of any recent fever or illness.  He has a good appetite and denies weight loss. He denies any chest pain, shortness of breath, cough, or hemoptysis.  He has no nausea, vomiting, constipation, or diarrhea. He has no urinary complaints.  Patient's son and wife report no further specific complaints.  REVIEW OF SYSTEMS:   Review of Systems    Unable to perform ROS: Dementia   PAST MEDICAL HISTORY: Past Medical History:  Diagnosis Date  . 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 Dementia ([place])   . Diabetes mellitus without complication ([place])   . Dyspnea   . Pneumonia   . Psoriasis   . Tuberculosis    per wife as a child    PAST SURGICAL HISTORY: Past Surgical History:  Procedure Laterality Date  . CATARACT EXTRACTION      FAMILY HISTORY: Family History  Problem Relation Age of Onset  . Heart disease Mother   . Dementia Mother   . Diabetes Father   . Heart disease Father   . Peripheral vascular disease Brother   . Diabetes Brother     ADVANCED DIRECTIVES (Y/N):  N  HEALTH MAINTENANCE: Social History   Tobacco Use  . Smoking status: Former Smoker    Quit date: 02/20/1976    Years since quitting: 43.3  . Smokeless tobacco: Never Used  Substance Use Topics  . Alcohol use: No  . Drug use: No     Colonoscopy:  PAP:  Bone density:  Lipid panel:  Allergies  Allergen Reactions  . Ciprofloxacin Anaphylaxis  . Hydrocodone-Acetaminophen Nausea And Vomiting  . Penicillins     rash  . Sulfa Antibiotics     Current Outpatient Medications  Medication Sig Dispense Refill  . CVS LANCETS ULTRA-THIN 30G MISC USE 1 LANCET TO CHECK SUGAR TWICE A DAY 200 each 12  . glucose blood (ACCU-CHEK AVIVA) test strip 1 each by Other route daily. Use as instructed 100 each 12  . metFORMIN (  GLUCOPHAGE) 1000 MG tablet TAKE 1 TABLET (1,000 MG TOTAL) BY MOUTH DAILY AFTER SUPPER. 90 tablet 3   No current facility-administered medications for this visit.    OBJECTIVE: Vitals:     There is no height or weight on file to calculate BMI.    ECOG FS:2 - Symptomatic, <50% confined to bed   General: Well-developed, well-nourished, no acute distress. HEENT: Normocephalic. Neuro: Alert, answering all questions appropriately. Cranial nerves grossly intact. Psych: Normal affect.  LAB  RESULTS:  Lab Results  Component Value Date   NA 135 01/15/2019   K 5.0 01/15/2019   CL 99 01/15/2019   CO2 23 03/17/2018   GLUCOSE 218 (H) 01/15/2019   BUN 21 01/15/2019   CREATININE 1.30 (H) 06/16/2019   CALCIUM 9.3 01/15/2019   PROT 6.3 01/15/2019   ALBUMIN 4.1 01/15/2019   AST 16 01/15/2019   ALT 32 03/17/2018   ALKPHOS 63 01/15/2019   BILITOT 0.3 01/15/2019   GFRNONAA 45 (L) 01/15/2019   GFRAA 52 (L) 01/15/2019    Lab Results  Component Value Date   WBC 9.0 01/15/2019   NEUTROABS 5.9 01/15/2019   HGB 12.9 (L) 01/15/2019   HCT 38.2 01/15/2019   MCV 88 01/15/2019   PLT 238 01/15/2019     STUDIES: CT CHEST W CONTRAST  Result Date: 06/16/2019 CLINICAL DATA:  Restaging lung cancer. Status post XRT. EXAM: CT CHEST WITH CONTRAST TECHNIQUE: Multidetector CT imaging of the chest was performed during intravenous contrast administration. CONTRAST:  54mL OMNIPAQUE IOHEXOL 300 MG/ML  SOLN COMPARISON:  12/10/2018 FINDINGS: Cardiovascular: The heart size is mildly enlarged. Aortic atherosclerosis. Left main, lad and RCA coronary artery calcifications noted. Mediastinum/Nodes: Small bilateral thyroid nodules are noted. There is decreased AP diameter of the trachea which may be seen with tracheobronchomalacia. The trachea is otherwise midline and remains patent. No mediastinal or hilar adenopathy identified. Lungs/Pleura: Small right pleural effusion is new from previous exam. -index right upper lobe lung mass measures 5.8 x 3.6 cm, image 39/2. Increase surrounding ground-glass attenuation, fibrosis and airspace consolidation compatible with changes of external beam radiation. On the previous exam this measured 5.6 x 3.7 cm. -previous left lower lobe ground-glass attenuating nodule is resolved. -Solid left upper lobe lung nodule measures 3 mm, image 54/3. Unchanged. -Calcified granuloma noted in the anterior left upper lobe. Upper Abdomen: Marked diffuse hepatic steatosis. No acute  abnormality. Musculoskeletal: No chest wall abnormality. No acute or significant osseous findings. IMPRESSION: 1. No significant interval change in size of index right upper lobe lung mass. However, progressive changes of external beam radiation are noted within the right upper lobe surrounding index lesion. 2. Resolution of previous left lower lobe ground-glass attenuating nodule. 3. New small right pleural effusion. 4. Left main, lad and RCA coronary artery calcifications. 5. Hepatic steatosis. Aortic Atherosclerosis (ICD10-I70.0). Electronically Signed   By: Kerby Moors M.D.   On: 06/16/2019 14:58    ASSESSMENT: Right upper lobe lung mass.  PLAN:    1. Right upper lobe lung mass: Although no biopsy was able to be performed, given the continued growth, this is highly suspicious for underlying malignancy and patient underwent palliative XRT completing in June 2019.  CT scan results from May 31, 2016 measured right upper lobe lung mass at 3.9 x 3.5 cm.  His most recent imaging on June 16, 2019 reviewed independently and reported as above with mass measuring 5.8 x 3.6 cm.  Although increasing in size over the last 3 years, there has been  no appreciable change over the last 6 months when compared to September 2020.  Patient son and wife understand that treatment options are limited, but wished to continue simple surveillance with CT scans every 6 months.  Will reimage in September 2021 with video assisted telemedicine visit 1 to 2 days later.   I spent a total of 20 minutes reviewing chart data, face-to-face evaluation with the patient, counseling and coordination of care as detailed above.   Patient expressed understanding and was in agreement with this plan. He also understands that He can call clinic at any time with any questions, concerns, or complaints.   Cancer Staging No matching staging information was found for the patient.  Lloyd Huger, MD   06/18/2019 3:51 PM

## 2019-06-19 ENCOUNTER — Inpatient Hospital Stay: Payer: Medicare HMO | Admitting: Oncology

## 2019-07-06 ENCOUNTER — Ambulatory Visit (INDEPENDENT_AMBULATORY_CARE_PROVIDER_SITE_OTHER): Payer: Medicare HMO | Admitting: Family Medicine

## 2019-07-06 ENCOUNTER — Other Ambulatory Visit: Payer: Self-pay

## 2019-07-06 ENCOUNTER — Encounter: Payer: Self-pay | Admitting: Family Medicine

## 2019-07-06 VITALS — BP 133/76 | HR 93 | Temp 96.9°F | Wt 194.6 lb

## 2019-07-06 DIAGNOSIS — E119 Type 2 diabetes mellitus without complications: Secondary | ICD-10-CM | POA: Diagnosis not present

## 2019-07-06 DIAGNOSIS — L602 Onychogryphosis: Secondary | ICD-10-CM

## 2019-07-06 DIAGNOSIS — G301 Alzheimer's disease with late onset: Secondary | ICD-10-CM | POA: Diagnosis not present

## 2019-07-06 DIAGNOSIS — F0281 Dementia in other diseases classified elsewhere with behavioral disturbance: Secondary | ICD-10-CM

## 2019-07-06 NOTE — Progress Notes (Signed)
Established patient visit      Patient: Joel Grant   DOB: June 01, 1926   86 y.o. Male  MRN: [id number] Visit Date: 07/06/2019  Today's healthcare provider: Vernie Murders, PA  Subjective:    Chief Complaint  Patient presents with   Toe Pain   Toe Pain  Incident onset: 1 day ago. There was no injury mechanism. The pain is present in the right toes. The quality of the pain is described as aching. The pain has been worsening since onset. He reports no foreign bodies present. The symptoms are aggravated by weight bearing. He has tried nothing for the symptoms.     Patient Active Problem List   Diagnosis Date Noted   Rash and nonspecific skin eruption 01/14/2017   Mass of upper lobe of right lung 02/05/2016   Sepsis ([place]) 01/28/2016   Adenomatous colon polyp 12/09/2014   Alzheimer's dementia ([place]) 12/09/2014   Benign fibroma of prostate 12/09/2014   Diabetes ([place]) 12/09/2014   Glaucoma 12/09/2014   Adult hypothyroidism 12/09/2014   Adiposity 12/09/2014   Arthritis, degenerative 12/09/2014   Hypercholesterolemia without hypertriglyceridemia 12/09/2014   Avitaminosis D 12/09/2014   Past Medical History:  Diagnosis Date   Cancer ([place])    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   Dementia ([place])    Diabetes mellitus without complication ([place])    Dyspnea    Pneumonia    Psoriasis    Tuberculosis    per wife as a child   Social History   Socioeconomic History   Marital status: Married    Spouse name: Not on file   Number of children: 1   Years of education: Not on file   Highest education level: 7th grade  Occupational History   Occupation: retired  Tobacco Use   Smoking status: Former Smoker    Quit date: 02/20/1976    Years since quitting: 43.4   Smokeless tobacco: Never Used  Substance and Sexual Activity   Alcohol use: No   Drug use: No   Sexual activity: Not on file  Other Topics Concern   Not on file    Social History Narrative   Not on file   Social Determinants of Health   Financial Resource Strain:    Difficulty of Paying Living Expenses:   Food Insecurity:    Worried About Charity fundraiser in the Last Year:    Arboriculturist in the Last Year:   Transportation Needs:    Film/video editor (Medical):    Lack of Transportation (Non-Medical):   Physical Activity:    Days of Exercise per Week:    Minutes of Exercise per Session:   Stress:    Feeling of Stress :   Social Connections:    Frequency of Communication with Friends and Family:    Frequency of Social Gatherings with Friends and Family:    Attends Religious Services:    Active Member of Clubs or Organizations:    Attends Music therapist:    Marital Status:   Intimate Partner Violence:    Fear of Current or Ex-Partner:    Emotionally Abused:    Physically Abused:    Sexually Abused:    Family History  Problem Relation Age of Onset   Heart disease Mother    Dementia Mother    Diabetes Father    Heart disease Father    Peripheral vascular disease Brother    Diabetes Brother  Allergies  Allergen Reactions   Ciprofloxacin Anaphylaxis   Hydrocodone-Acetaminophen Nausea And Vomiting   Penicillins     rash   Sulfa Antibiotics        Medications: Outpatient Medications Prior to Visit  Medication Sig   [employer] LANCETS ULTRA-THIN 30G MISC USE 1 LANCET TO CHECK SUGAR TWICE A DAY   glucose blood (ACCU-CHEK AVIVA) test strip 1 each by Other route daily. Use as instructed   metFORMIN (GLUCOPHAGE) 1000 MG tablet TAKE 1 TABLET (1,000 MG TOTAL) BY MOUTH DAILY AFTER SUPPER.   No facility-administered medications prior to visit.    Review of Systems  Constitutional: Negative.   Respiratory: Negative.   Cardiovascular: Negative.   Musculoskeletal: Negative.         Objective:    BP 133/76 (BP Location: Right Arm, Patient Position: Sitting, Cuff Size:  Normal)    Pulse 93    Temp (!) 96.9 F (36.1 C) (Temporal)    Wt 194 lb 9.6 oz (88.3 kg)    BMI 30.48 kg/m    Physical Exam Constitutional:      General: He is not in acute distress.    Appearance: He is well-developed.  HENT:     Head: Normocephalic and atraumatic.     Right Ear: Hearing normal.     Left Ear: Hearing normal.     Nose: Nose normal.  Eyes:     General: Lids are normal. No scleral icterus.       Right eye: No discharge.        Left eye: No discharge.     Conjunctiva/sclera: Conjunctivae normal.  Pulmonary:     Effort: Pulmonary effort is normal. No respiratory distress.  Musculoskeletal:        General: Normal range of motion.     Comments: Long toenails curling under the plantar surface on both feet.  Skin:    Findings: No lesion or rash.  Neurological:     Mental Status: He is alert. He is disoriented.  Psychiatric:        Speech: Speech normal.        Behavior: Behavior normal.        Thought Content: Thought content normal.          Assessment & Plan:    1. Long toenail Very long nails on multiple toes of each foot that curl around the tip of the toe to the plantar surface. Trimmed the longest of 2 toes on the right foot and left foot to decrease irritation to skin. Needs podiatry referral since we do not have instruments to trim more completely. - Ambulatory referral to Podiatry  2. Late onset Alzheimer's disease with behavioral disturbance ([place]) Disoriented and confused. Cooperative today.  3. Type 2 diabetes mellitus without complication, without long-term current use of insulin (HCC) No sign of infection under the toenails that were trimmed today. Unable to assess for neuropathy due to dementia. Schedule podiatry referral. Follow up with Dr. Rosanna Randy to assess diabetes control. - Ambulatory referral to [place], [place] [phone] (phone) [phone] (fax)  [place]

## 2019-07-13 ENCOUNTER — Ambulatory Visit: Payer: Medicare HMO | Admitting: Podiatry

## 2019-07-13 ENCOUNTER — Encounter: Payer: Self-pay | Admitting: Podiatry

## 2019-07-13 ENCOUNTER — Other Ambulatory Visit: Payer: Self-pay

## 2019-07-13 DIAGNOSIS — M79674 Pain in right toe(s): Secondary | ICD-10-CM | POA: Diagnosis not present

## 2019-07-13 DIAGNOSIS — E119 Type 2 diabetes mellitus without complications: Secondary | ICD-10-CM | POA: Diagnosis not present

## 2019-07-13 DIAGNOSIS — B351 Tinea unguium: Secondary | ICD-10-CM

## 2019-07-13 DIAGNOSIS — M79675 Pain in left toe(s): Secondary | ICD-10-CM | POA: Diagnosis not present

## 2019-07-13 NOTE — Progress Notes (Signed)
This patient returns to my office for at risk foot care.  This patient requires this care by a professional since this patient will be at risk due to having diabetic neuropathy.  He presents to the office with his wife.   Patient was referred to this office for nail care.  This patient is unable to cut nails himself since the patient cannot reach his nails.These nails are painful walking and wearing shoes.  This patient presents for at risk foot care today.  General Appearance  Alert, conversant and in no acute stress.  Vascular  Dorsalis pedis and posterior tibial  pulses are palpable  bilaterally.  Capillary return is within normal limits  bilaterally. Temperature is within normal limits  bilaterally.  Neurologic  Deferred due to Alxheimer disease  Nails Thick disfigured discolored nails with subungual debris  from hallux to fifth toes bilaterally. No evidence of bacterial infection or drainage bilaterally.  Orthopedic  No limitations of motion  feet .  No crepitus or effusions noted.  No bony pathology or digital deformities noted.  Skin  normotropic skin with no porokeratosis noted bilaterally.  No signs of infections or ulcers noted.   Necrotic tissue 4th interspace right foot.  Onychomycosis  Pain in right toes  Pain in left toes  Consent was obtained for treatment procedures.   Mechanical debridement of nails 1-5  bilaterally performed with a nail nipper.  Filed with dremel without incident.    Return office visit   3 months                   Told patient to return for periodic foot care and evaluation due to potential at risk complications.   Gardiner Barefoot DPM

## 2019-07-15 NOTE — Progress Notes (Signed)
Subjective:   Joel Grant is a 86 y.o. male who presents for Medicare Annual/Subsequent preventive examination.  Review of Systems:  N/A  Cardiac Risk Factors include: advanced age (>52men, >32 women);diabetes mellitus;male gender     Objective:    Vitals: BP (!) 118/56 (BP Location: Right Arm)   Pulse 94   Temp 97.8 F (36.6 C) (Oral)   Ht 5\' 7"  (1.702 m)   Wt 193 lb (87.5 kg)   BMI 30.23 kg/m   Body mass index is 30.23 kg/m.  Advanced Directives 07/16/2019 06/18/2019 05/22/2019 05/18/2019 12/10/2018 06/12/2018 02/19/2018  Does Patient Have a Medical Advance Directive? Yes Yes Yes Yes Yes Yes Yes  Type of Advance Directive Living will Living will Living will Living will [place];Living will [place];Living will Living will  Does patient want to make changes to medical advance directive? - No - Patient declined No - Patient declined No - Patient declined No - Patient declined - -  Copy of [place] in Chart? - No - copy requested No - copy requested - No - copy requested - -  Would patient like information on creating a medical advance directive? - - - - No - Patient declined - -    Tobacco Social History   Tobacco Use  Smoking Status Former Smoker  . Quit date: 02/20/1976  . Years since quitting: 43.4  Smokeless Tobacco Never Used     Counseling given: Not Answered   Clinical Intake:  Pre-visit preparation completed: Yes  Pain : No/denies pain Pain Score: 0-No pain     Nutritional Status: BMI > 30  Obese Nutritional Risks: None Diabetes: Yes CBG done?: No Did pt. bring in CBG monitor from home?: No  How often do you need to have someone help you when you read instructions, pamphlets, or other written materials from your doctor or pharmacy?: 1 - Never   Diabetes:  Is the patient diabetic?  Yes  If diabetic, was a CBG obtained today?  No  Did the patient bring in their glucometer from home?  No    How often do you monitor your CBG's? Twice a week.   Financial Strains and Diabetes Management:  Are you having any financial strains with the device, your supplies or your medication? No .  Does the patient want to be seen by Chronic Care Management for management of their diabetes?  No  Would the patient like to be referred to a Nutritionist or for Diabetic Management?  No   Diabetic Exams:  Diabetic Eye Exam: Completed 09/22/15. Overdue for diabetic eye exam. Pt has been advised about the importance in completing this exam. Unable to complete due to dementia.  Diabetic Foot Exam: Completed 07/13/19. Repeat yearly.  Interpreter Needed?: No  Information entered by :: [facility], LPN  Past Medical History:  Diagnosis Date  . 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 Dementia ([place])   . Diabetes mellitus without complication ([place])   . Dyspnea   . Pneumonia   . Psoriasis   . Tuberculosis    per wife as a child   Past Surgical History:  Procedure Laterality Date  . CATARACT EXTRACTION     Family History  Problem Relation Age of Onset  . Heart disease Mother   . Dementia Mother   . Diabetes Father   . Heart disease Father   . Peripheral vascular disease Brother   .  Diabetes Brother    Social History   Socioeconomic History  . Marital status: Married    Spouse name: Not on file  . Number of children: 1  . Years of education: Not on file  . Highest education level: 7th grade  Occupational History  . Occupation: retired  Tobacco Use  . Smoking status: Former Smoker    Quit date: 02/20/1976    Years since quitting: 43.4  . Smokeless tobacco: Never Used  Substance and Sexual Activity  . Alcohol use: No  . Drug use: No  . Sexual activity: Not on file  Other Topics Concern  . Not on file  Social History Narrative  . Not on file   Social Determinants of Health   Financial Resource Strain: Low Risk   . Difficulty of Paying  Living Expenses: Not hard at all  Food Insecurity: No Food Insecurity  . Worried About Charity fundraiser in the Last Year: Never true  . Ran Out of Food in the Last Year: Never true  Transportation Needs: No Transportation Needs  . Lack of Transportation (Medical): No  . Lack of Transportation (Non-Medical): No  Physical Activity: Inactive  . Days of Exercise per Week: 0 days  . Minutes of Exercise per Session: 0 min  Stress: No Stress Concern Present  . Feeling of Stress : Not at all  Social Connections: Somewhat Isolated  . Frequency of Communication with Friends and Family: Never  . Frequency of Social Gatherings with Friends and Family: Three times a week  . Attends Religious Services: Never  . Active Member of Clubs or Organizations: No  . Attends Archivist Meetings: Never  . Marital Status: Married    Outpatient Encounter Medications as of 07/16/2019  Medication Sig  . [employer]S LANCETS ULTRA-THIN 30G MISC USE 1 LANCET TO CHECK SUGAR TWICE A DAY  . glucose blood (ACCU-CHEK AVIVA) test strip 1 each by Other route daily. Use as instructed  . metFORMIN (GLUCOPHAGE) 1000 MG tablet TAKE 1 TABLET (1,000 MG TOTAL) BY MOUTH DAILY AFTER SUPPER.   No facility-administered encounter medications on file as of 07/16/2019.    Activities of Daily Living In your present state of health, do you have any difficulty performing the following activities: 07/16/2019 07/06/2019  Hearing? Y Y  Comment Does not wear hearing aids. -  Vision? N N  Difficulty concentrating or making decisions? Y Y  Comment Has Dementia. -  Walking or climbing stairs? N Y  Dressing or bathing? Y Y  Comment Wife assists with bathing. -  Doing errands, shopping? Y Y  Comment Does not drive. -  Preparing Food and eating ? Y -  Comment Does not cook or prep food. -  Using the Toilet? N -  In the past six months, have you accidently leaked urine? Y -  Comment Wears depends daily. -  Do you have problems with  loss of bowel control? Y -  Comment Wears depends daily. -  Managing your Medications? Y -  Comment Wife manages medications. -  Managing your Finances? Y -  Comment Wife manages medications. -  Housekeeping or managing your Housekeeping? Y -  Comment Does not clean. -  Some recent data might be hidden    Patient Care Team: Jerrol Banana., MD as PCP - General (Family Medicine) Grayland Ormond, Kathlene November, MD as Consulting Physician (Oncology) Noreene Filbert, MD as Referring Physician (Radiation Oncology) Gardiner Barefoot, DPM as Consulting Physician (Podiatry)   Assessment:  This is a routine wellness examination for [place].  Exercise Activities and Dietary recommendations Current Exercise Habits: The patient does not participate in regular exercise at present, Exercise limited by: Other - see comments(Has dementia.)  Goals    . DIET - INCREASE WATER INTAKE     Recommend increasing water intake to 4 glasses a day.     . Exercise 3x per week (30 min per time)     Recommend to exercise for 3 days a week for at least 30 minutes at a time.         Fall Risk Fall Risk  07/16/2019 07/06/2019 05/20/2018 02/19/2018 10/14/2017  Falls in the past year? 0 0 0 0 No  Number falls in past yr: 0 0 - - -  Injury with Fall? 0 0 - - -   FALL RISK PREVENTION PERTAINING TO THE HOME:  Any stairs in or around the home? No  If so, are there any without handrails? N/A  Home free of loose throw rugs in walkways, pet beds, electrical cords, etc? Yes  Adequate lighting in your home to reduce risk of falls? Yes   ASSISTIVE DEVICES UTILIZED TO PREVENT FALLS:  Life alert? No  Use of a cane, walker or w/c? No  Grab bars in the bathroom? No  Shower chair or bench in shower? No  Elevated toilet seat or a handicapped toilet? Yes    TIMED UP AND GO:  Was the test performed? No .    Depression Screen PHQ 2/9 Scores 07/06/2019 05/20/2018 02/19/2018 10/14/2017  PHQ - 2 Score 0 0 0 0    Cognitive  Function: Unable to complete due to Dementia.  MMSE - Mini Mental State Exam 05/13/2017 09/17/2016  Orientation to time 0 1  Orientation to Place 5 2  Registration 1 1  Attention/ Calculation 0 0  Recall 0 0  Language- name 2 objects 1 1  Language- repeat 1 0  Language- follow 3 step command 3 3  Language- read & follow direction 1 1  Write a sentence 0 0  Copy design 0 1  Total score 12 10     6CIT Screen 06/04/2016 02/16/2016  What Year? 4 points 4 points  What month? 3 points 0 points  What time? 0 points 3 points  Count back from 20 0 points 4 points  Months in reverse 4 points 4 points  Repeat phrase 10 points 10 points  Total Score 21 25    Immunization History  Administered Date(s) Administered  . Fluad Quad(high Dose 65+) 01/15/2019  . Influenza, High Dose Seasonal PF 12/13/2014, 12/16/2015, 01/07/2017, 01/09/2018  . Pneumococcal Conjugate-13 03/18/2014  . Pneumococcal Polysaccharide-23 01/14/2013  . Td 11/02/2002    Qualifies for Shingles Vaccine? Yes . Due for Shingrix. Pt has been advised to call insurance company to determine out of pocket expense. Advised may also receive vaccine at local pharmacy or Health Dept. Verbalized acceptance and understanding.  Tdap: Up to date  Flu Vaccine: Up to date  Pneumococcal Vaccine: Completed series  Screening Tests Health Maintenance  Topic Date Due  . OPHTHALMOLOGY EXAM  Never done  . URINE MICROALBUMIN  06/04/2017  . HEMOGLOBIN A1C  07/16/2019  . INFLUENZA VACCINE  11/01/2019  . FOOT EXAM  07/12/2020  . TETANUS/TDAP  01/15/2023  . PNA vac Low Risk Adult  Completed   Cancer Screenings:  Colorectal Screening: No longer required.   Lung Cancer Screening: (Low Dose CT Chest recommended if Age 24-80 years, 24  pack-year currently smoking OR have quit w/in 15years.) does not qualify.   Additional Screening:  Dental Screening: Recommended annual dental exams for proper oral hygiene  Community Resource  Referral:  CRR required this visit?  No        Plan:  I have personally reviewed and addressed the Medicare Annual Wellness questionnaire and have noted the following in the patient's chart:  A. Medical and social history B. Use of alcohol, tobacco or illicit drugs  C. Current medications and supplements D. Functional ability and status E.  Nutritional status F.  Physical activity G. Advance directives H. List of other physicians I.  Hospitalizations, surgeries, and ER visits in previous 12 months J.  [place] such as hearing and vision if needed, cognitive and depression L. Referrals and appointments   In addition, I have reviewed and discussed with patient certain preventive protocols, quality metrics, and best practice recommendations. A written personalized care plan for preventive services as well as general preventive health recommendations were provided to patient.   Glendora Score, [place]  11/17/5629 Nurse Health Advisor  Nurse Notes: Pt needs a urine check and Hgb A1c check at today's OV. Declined scheduling an eye exam at this time due to dementia.

## 2019-07-15 NOTE — Progress Notes (Signed)
Complete physical exam     Patient: Joel Grant   DOB: [birth year]   86 y.o. Male  MRN: [id number] Visit Date: 07/16/2019  Today's healthcare provider: Wilhemena Durie, MD  Subjective:    Chief Complaint  Patient presents with  . Diabetes Mellitus  . Hypothyroidism    Joel Grant is a 86 y.o. male who presents today for a complete physical exam.  He reports consuming a general diet. The patient does not participate in regular exercise at present. He generally feels fairly well. He reports sleeping fairly well. He does not have additional problems to discuss today.  HPI  Patient is in for follow-up.  He still lives independently with his wife.  He has progressive dementia.  He also has a lung mass is probably cancer.  This is being followed by Dr. Grayland Ormond.  He does not wander at home but has agitation with his dementia.  He came out of the room 6 times while he is waiting for me to come in to see him. Patient had AWV today with [facility].  Past Medical History:  Diagnosis Date  . 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 Dementia ([place])   . Diabetes mellitus without complication ([place])   . Dyspnea   . Pneumonia   . Psoriasis   . Tuberculosis    per wife as a child   Past Surgical History:  Procedure Laterality Date  . CATARACT EXTRACTION     Social History   Socioeconomic History  . Marital status: Married    Spouse name: Not on file  . Number of children: 1  . Years of education: Not on file  . Highest education level: 7th grade  Occupational History  . Occupation: retired  Tobacco Use  . Smoking status: Former Smoker    Quit date: 02/20/1976    Years since quitting: 43.4  . Smokeless tobacco: Never Used  Substance and Sexual Activity  . Alcohol use: No  . Drug use: No  . Sexual activity: Not on file  Other Topics Concern  . Not on file  Social History Narrative  . Not on file   Social Determinants of Health    Financial Resource Strain: Low Risk   . Difficulty of Paying Living Expenses: Not hard at all  Food Insecurity: No Food Insecurity  . Worried About Charity fundraiser in the Last Year: Never true  . Ran Out of Food in the Last Year: Never true  Transportation Needs: No Transportation Needs  . Lack of Transportation (Medical): No  . Lack of Transportation (Non-Medical): No  Physical Activity: Inactive  . Days of Exercise per Week: 0 days  . Minutes of Exercise per Session: 0 min  Stress: No Stress Concern Present  . Feeling of Stress : Not at all  Social Connections: Somewhat Isolated  . Frequency of Communication with Friends and Family: Never  . Frequency of Social Gatherings with Friends and Family: Three times a week  . Attends Religious Services: Never  . Active Member of Clubs or Organizations: No  . Attends Archivist Meetings: Never  . Marital Status: Married  Human resources officer Violence: Not At Risk  . Fear of Current or Ex-Partner: No  . Emotionally Abused: No  . Physically Abused: No  . Sexually Abused: No   Family Status  Relation Name Status  . Mother  Deceased at age 67  CVA  . Father  Deceased  . Sister  Deceased  . Brother  Deceased at age 83 yrs ol  . Brother  Deceased at age 51 months old  . Brother  Deceased at age 63  . Brother  Deceased at age 54  . Sister  Deceased  . Sister  Deceased  . Son  Alive   Family History  Problem Relation Age of Onset  . Heart disease Mother   . Dementia Mother   . Diabetes Father   . Heart disease Father   . Peripheral vascular disease Brother   . Diabetes Brother    Allergies  Allergen Reactions  . Ciprofloxacin Anaphylaxis  . Hydrocodone-Acetaminophen Nausea And Vomiting  . Penicillins     rash  . Sulfa Antibiotics     Patient Care Team: Jerrol Banana., MD as PCP - General (Family Medicine) Lloyd Huger, MD as Consulting Physician (Oncology) Noreene Filbert, MD as Referring  Physician (Radiation Oncology) Gardiner Barefoot, DPM as Consulting Physician (Podiatry)   Medications: Outpatient Medications Prior to Visit  Medication Sig  . [employer] LANCETS ULTRA-THIN 30G MISC USE 1 LANCET TO CHECK SUGAR TWICE A DAY  . glucose blood (ACCU-CHEK AVIVA) test strip 1 each by Other route daily. Use as instructed  . metFORMIN (GLUCOPHAGE) 1000 MG tablet TAKE 1 TABLET (1,000 MG TOTAL) BY MOUTH DAILY AFTER SUPPER.   No facility-administered medications prior to visit.    Review of Systems  Constitutional: Negative.   HENT: Negative.   Eyes: Negative.   Respiratory: Negative.   Cardiovascular: Negative.   Gastrointestinal: Negative.   Endocrine: Negative.   Genitourinary: Negative.   Allergic/Immunologic: Negative.   Neurological:       Progressive memory loss.  Hematological: Negative.   Psychiatric/Behavioral: Positive for agitation.         Objective:    BP (!) 118/56 (BP Location: Right Arm, Patient Position: Sitting)   Pulse 94   Temp 97.8 F (36.6 C) (Oral)   Ht 5\' 7"  (1.702 m)   Wt 193 lb (87.5 kg)   BMI 30.23 kg/m  Wt Readings from Last 3 Encounters:  07/16/19 193 lb (87.5 kg)  07/16/19 193 lb (87.5 kg)  07/06/19 194 lb 9.6 oz (88.3 kg)      Physical Exam Vitals reviewed.  Constitutional:      Appearance: He is well-developed.  HENT:     Head: Normocephalic and atraumatic.     Right Ear: External ear normal.     Left Ear: External ear normal.     Nose: Nose normal.  Eyes:     General: No scleral icterus.    Conjunctiva/sclera: Conjunctivae normal.  Neck:     Thyroid: No thyromegaly.  Cardiovascular:     Rate and Rhythm: Normal rate and regular rhythm.     Heart sounds: Normal heart sounds.  Pulmonary:     Effort: Pulmonary effort is normal.     Breath sounds: Normal breath sounds.     Comments: Decreased breath sounds in both upper lobes. Abdominal:     Palpations: Abdomen is soft.  Skin:    General: Skin is warm and dry.   Neurological:     General: No focal deficit present.     Mental Status: He is alert and oriented to person, place, and time.  Psychiatric:        Mood and Affect: Mood normal.        Behavior: Behavior normal.  Depression Screen  PHQ 2/9 Scores 07/06/2019 05/20/2018 02/19/2018  PHQ - 2 Score 0 0 0    No results found for any visits on 07/16/19.    Assessment & Plan:    Routine Health Maintenance and Physical Exam  Exercise Activities and Dietary recommendations Goals    . DIET - INCREASE WATER INTAKE     Recommend increasing water intake to 4 glasses a day.     . Exercise 3x per week (30 min per time)     Recommend to exercise for 3 days a week for at least 30 minutes at a time.         Immunization History  Administered Date(s) Administered  . Fluad Quad(high Dose 65+) 01/15/2019  . Influenza, High Dose Seasonal PF 12/13/2014, 12/16/2015, 01/07/2017, 01/09/2018  . Pneumococcal Conjugate-13 03/18/2014  . Pneumococcal Polysaccharide-23 01/14/2013  . Td 11/02/2002    Health Maintenance  Topic Date Due  . OPHTHALMOLOGY EXAM  Never done  . URINE MICROALBUMIN  06/04/2017  . HEMOGLOBIN A1C  07/16/2019  . INFLUENZA VACCINE  11/01/2019  . FOOT EXAM  07/12/2020  . TETANUS/TDAP  01/15/2023  . PNA vac Low Risk Adult  Completed    Discussed health benefits of physical activity, and encouraged him to engage in regular exercise appropriate for his age and condition. 1. Annual physical exam Age-appropriate exam is done.  Patient's diabetes is controlled but dementia is worsening.  May need placement in the next few months.  He also has a lung mass that is probably cancerous.  Followed by oncology  2. Adult hypothyroidism Check TSH - CBC with Differential/Platelet - Comprehensive metabolic panel - TSH - Hemoglobin A1c  3. Late onset Alzheimer's disease with behavioral disturbance ([place]) Consult CCM for help for wife.  Also meet may need to fill out FL 2 for  placement in memory care unit - CBC with Differential/Platelet - Comprehensive metabolic panel - TSH - Hemoglobin A1c  4. Type 2 diabetes mellitus without complication, without long-term current use of insulin (HCC) A1c today.  I will see him back in 4 months. - CBC with Differential/Platelet - Comprehensive metabolic panel - TSH - Hemoglobin A1c  5. Class 1 obesity due to excess calories without serious comorbidity with body mass index (BMI) of 32.0 to 32.9 in adult  - CBC with Differential/Platelet - Comprehensive metabolic panel - TSH - Hemoglobin A1c  6. Primary osteoarthritis of both knees  - CBC with Differential/Platelet - Comprehensive metabolic panel - TSH - Hemoglobin A1c 7.  Lung mass Followed by oncology in September    No follow-ups on file.         Cranford Mon, MD  [facility] [phone] (phone) [phone] (fax)  [place]

## 2019-07-16 ENCOUNTER — Ambulatory Visit (INDEPENDENT_AMBULATORY_CARE_PROVIDER_SITE_OTHER): Payer: Medicare HMO

## 2019-07-16 ENCOUNTER — Ambulatory Visit (INDEPENDENT_AMBULATORY_CARE_PROVIDER_SITE_OTHER): Payer: Medicare HMO | Admitting: Family Medicine

## 2019-07-16 ENCOUNTER — Encounter: Payer: Self-pay | Admitting: Family Medicine

## 2019-07-16 ENCOUNTER — Other Ambulatory Visit: Payer: Self-pay

## 2019-07-16 VITALS — BP 118/56 | HR 94 | Temp 97.8°F | Ht 67.0 in | Wt 193.0 lb

## 2019-07-16 DIAGNOSIS — Z0000 Encounter for general adult medical examination without abnormal findings: Secondary | ICD-10-CM | POA: Diagnosis not present

## 2019-07-16 DIAGNOSIS — F0281 Dementia in other diseases classified elsewhere with behavioral disturbance: Secondary | ICD-10-CM | POA: Diagnosis not present

## 2019-07-16 DIAGNOSIS — R918 Other nonspecific abnormal finding of lung field: Secondary | ICD-10-CM | POA: Diagnosis not present

## 2019-07-16 DIAGNOSIS — Z6832 Body mass index (BMI) 32.0-32.9, adult: Secondary | ICD-10-CM

## 2019-07-16 DIAGNOSIS — E119 Type 2 diabetes mellitus without complications: Secondary | ICD-10-CM

## 2019-07-16 DIAGNOSIS — M170 Bilateral primary osteoarthritis of knee: Secondary | ICD-10-CM

## 2019-07-16 DIAGNOSIS — E039 Hypothyroidism, unspecified: Secondary | ICD-10-CM

## 2019-07-16 DIAGNOSIS — E6609 Other obesity due to excess calories: Secondary | ICD-10-CM | POA: Diagnosis not present

## 2019-07-16 DIAGNOSIS — F02818 Dementia in other diseases classified elsewhere, unspecified severity, with other behavioral disturbance: Secondary | ICD-10-CM

## 2019-07-16 DIAGNOSIS — G301 Alzheimer's disease with late onset: Secondary | ICD-10-CM

## 2019-07-16 NOTE — Patient Instructions (Signed)
Joel Grant , Thank you for taking time to come for your Medicare Wellness Visit. I appreciate your ongoing commitment to your health goals. Please review the following plan we discussed and let me know if I can assist you in the future.   Screening recommendations/referrals: Colonoscopy: No longer required.  Recommended yearly ophthalmology/optometry visit for glaucoma screening and checkup Recommended yearly dental visit for hygiene and checkup  Vaccinations: Influenza vaccine: Up to date Pneumococcal vaccine: Completed series Tdap vaccine: Up to date Shingles vaccine: Pt declines today.     Advanced directives: Please bring a copy of your POA (Power of Attorney) and/or Living Will to your next appointment.   Conditions/risks identified: Recommend to increase water intake to 6-8 8 oz glasses a day.   Next appointment: 10:40 AM with Dr Rosanna Randy. Declined scheduling an AWV for 2022 at this time.   Preventive Care 84 Years and Older, Male Preventive care refers to lifestyle choices and visits with your health care provider that can promote health and wellness. What does preventive care include?  A yearly physical exam. This is also called an annual well check.  Dental exams once or twice a year.  Routine eye exams. Ask your health care provider how often you should have your eyes checked.  Personal lifestyle choices, including:  Daily care of your teeth and gums.  Regular physical activity.  Eating a healthy diet.  Avoiding tobacco and drug use.  Limiting alcohol use.  Practicing safe sex.  Taking low doses of aspirin every day.  Taking vitamin and mineral supplements as recommended by your health care provider. What happens during an annual well check? The services and screenings done by your health care provider during your annual well check will depend on your age, overall health, lifestyle risk factors, and family history of disease. Counseling  Your health care  provider may ask you questions about your:  Alcohol use.  Tobacco use.  Drug use.  Emotional well-being.  Home and relationship well-being.  Sexual activity.  Eating habits.  History of falls.  Memory and ability to understand (cognition).  Work and work Statistician. Screening  You may have the following tests or measurements:  Height, weight, and BMI.  Blood pressure.  Lipid and cholesterol levels. These may be checked every 5 years, or more frequently if you are over 69 years old.  Skin check.  Lung cancer screening. You may have this screening every year starting at age 32 if you have a 30-pack-year history of smoking and currently smoke or have quit within the past 15 years.  Fecal occult blood test (FOBT) of the stool. You may have this test every year starting at age 37.  Flexible sigmoidoscopy or colonoscopy. You may have a sigmoidoscopy every 5 years or a colonoscopy every 10 years starting at age 64.  Prostate cancer screening. Recommendations will vary depending on your family history and other risks.  Hepatitis C blood test.  Hepatitis B blood test.  Sexually transmitted disease (STD) testing.  Diabetes screening. This is done by checking your blood sugar (glucose) after you have not eaten for a while (fasting). You may have this done every 1-3 years.  Abdominal aortic aneurysm (AAA) screening. You may need this if you are a current or former smoker.  Osteoporosis. You may be screened starting at age 53 if you are at high risk. Talk with your health care provider about your test results, treatment options, and if necessary, the need for more tests. Vaccines  Your health care provider may recommend certain vaccines, such as:  Influenza vaccine. This is recommended every year.  Tetanus, diphtheria, and acellular pertussis (Tdap, Td) vaccine. You may need a Td booster every 10 years.  Zoster vaccine. You may need this after age 36.  Pneumococcal  13-valent conjugate (PCV13) vaccine. One dose is recommended after age 8.  Pneumococcal polysaccharide (PPSV23) vaccine. One dose is recommended after age 58. Talk to your health care provider about which screenings and vaccines you need and how often you need them. This information is not intended to replace advice given to you by your health care provider. Make sure you discuss any questions you have with your health care provider. Document Released: 04/15/2015 Document Revised: 12/07/2015 Document Reviewed: 01/18/2015 Elsevier Interactive Patient Education  2017 [place] Prevention in the Home Falls can cause injuries. They can happen to people of all ages. There are many things you can do to make your home safe and to help prevent falls. What can I do on the outside of my home?  Regularly fix the edges of walkways and driveways and fix any cracks.  Remove anything that might make you trip as you walk through a door, such as a raised step or threshold.  Trim any bushes or trees on the path to your home.  Use bright outdoor lighting.  Clear any walking paths of anything that might make someone trip, such as rocks or tools.  Regularly check to see if handrails are loose or broken. Make sure that both sides of any steps have handrails.  Any raised decks and porches should have guardrails on the edges.  Have any leaves, snow, or ice cleared regularly.  Use sand or salt on walking paths during winter.  Clean up any spills in your garage right away. This includes oil or grease spills. What can I do in the bathroom?  Use night lights.  Install grab bars by the toilet and in the tub and shower. Do not use towel bars as grab bars.  Use non-skid mats or decals in the tub or shower.  If you need to sit down in the shower, use a plastic, non-slip stool.  Keep the floor dry. Clean up any water that spills on the floor as soon as it happens.  Remove soap buildup in the  tub or shower regularly.  Attach bath mats securely with double-sided non-slip rug tape.  Do not have throw rugs and other things on the floor that can make you trip. What can I do in the bedroom?  Use night lights.  Make sure that you have a light by your bed that is easy to reach.  Do not use any sheets or blankets that are too big for your bed. They should not hang down onto the floor.  Have a firm chair that has side arms. You can use this for support while you get dressed.  Do not have throw rugs and other things on the floor that can make you trip. What can I do in the kitchen?  Clean up any spills right away.  Avoid walking on wet floors.  Keep items that you use a lot in easy-to-reach places.  If you need to reach something above you, use a strong step stool that has a grab bar.  Keep electrical cords out of the way.  Do not use floor polish or wax that makes floors slippery. If you must use wax, use non-skid floor wax.  Do  not have throw rugs and other things on the floor that can make you trip. What can I do with my stairs?  Do not leave any items on the stairs.  Make sure that there are handrails on both sides of the stairs and use them. Fix handrails that are broken or loose. Make sure that handrails are as long as the stairways.  Check any carpeting to make sure that it is firmly attached to the stairs. Fix any carpet that is loose or worn.  Avoid having throw rugs at the top or bottom of the stairs. If you do have throw rugs, attach them to the floor with carpet tape.  Make sure that you have a light switch at the top of the stairs and the bottom of the stairs. If you do not have them, ask someone to add them for you. What else can I do to help prevent falls?  Wear shoes that:  Do not have high heels.  Have rubber bottoms.  Are comfortable and fit you well.  Are closed at the toe. Do not wear sandals.  If you use a stepladder:  Make sure that it  is fully opened. Do not climb a closed stepladder.  Make sure that both sides of the stepladder are locked into place.  Ask someone to hold it for you, if possible.  Clearly mark and make sure that you can see:  Any grab bars or handrails.  First and last steps.  Where the edge of each step is.  Use tools that help you move around (mobility aids) if they are needed. These include:  Canes.  Walkers.  Scooters.  Crutches.  Turn on the lights when you go into a dark area. Replace any light bulbs as soon as they burn out.  Set up your furniture so you have a clear path. Avoid moving your furniture around.  If any of your floors are uneven, fix them.  If there are any pets around you, be aware of where they are.  Review your medicines with your doctor. Some medicines can make you feel dizzy. This can increase your chance of falling. Ask your doctor what other things that you can do to help prevent falls. This information is not intended to replace advice given to you by your health care provider. Make sure you discuss any questions you have with your health care provider. Document Released: 01/13/2009 Document Revised: 08/25/2015 Document Reviewed: 04/23/2014 Elsevier Interactive Patient Education  2017 [employer].

## 2019-07-17 LAB — CBC WITH DIFFERENTIAL/PLATELET
Basophils Absolute: 0 10*3/uL (ref 0.0–0.2)
Basos: 1 %
EOS (ABSOLUTE): 0.3 10*3/uL (ref 0.0–0.4)
Eos: 3 %
Hematocrit: 34.6 % — ABNORMAL LOW (ref 37.5–51.0)
Hemoglobin: 11.9 g/dL — ABNORMAL LOW (ref 13.0–17.7)
Immature Grans (Abs): 0 10*3/uL (ref 0.0–0.1)
Immature Granulocytes: 0 %
Lymphocytes Absolute: 1.8 10*3/uL (ref 0.7–3.1)
Lymphs: 20 %
MCH: 29.8 pg (ref 26.6–33.0)
MCHC: 34.4 g/dL (ref 31.5–35.7)
MCV: 87 fL (ref 79–97)
Monocytes Absolute: 0.8 10*3/uL (ref 0.1–0.9)
Monocytes: 9 %
Neutrophils Absolute: 5.9 10*3/uL (ref 1.4–7.0)
Neutrophils: 67 %
Platelets: 276 10*3/uL (ref 150–450)
RBC: 4 x10E6/uL — ABNORMAL LOW (ref 4.14–5.80)
RDW: 13.5 % (ref 11.6–15.4)
WBC: 8.8 10*3/uL (ref 3.4–10.8)

## 2019-07-17 LAB — COMPREHENSIVE METABOLIC PANEL
ALT: 14 IU/L (ref 0–44)
AST: 17 IU/L (ref 0–40)
Albumin/Globulin Ratio: 1.7 (ref 1.2–2.2)
Albumin: 4 g/dL (ref 3.5–4.6)
Alkaline Phosphatase: 66 IU/L (ref 39–117)
BUN/Creatinine Ratio: 16 (ref 10–24)
BUN: 25 mg/dL (ref 10–36)
Bilirubin Total: 0.3 mg/dL (ref 0.0–1.2)
CO2: 21 mmol/L (ref 20–29)
Calcium: 9.1 mg/dL (ref 8.6–10.2)
Chloride: 101 mmol/L (ref 96–106)
Creatinine, Ser: 1.53 mg/dL — ABNORMAL HIGH (ref 0.76–1.27)
GFR calc Af Amer: 46 mL/min/{1.73_m2} — ABNORMAL LOW (ref >59)
GFR calc non Af Amer: 39 mL/min/{1.73_m2} — ABNORMAL LOW (ref >59)
Globulin, Total: 2.4 g/dL (ref 1.5–4.5)
Glucose: 198 mg/dL — ABNORMAL HIGH (ref 65–99)
Potassium: 5 mmol/L (ref 3.5–5.2)
Sodium: 136 mmol/L (ref 134–144)
Total Protein: 6.4 g/dL (ref 6.0–8.5)

## 2019-07-17 LAB — HEMOGLOBIN A1C
Est. average glucose Bld gHb Est-mCnc: 174 mg/dL
Hgb A1c MFr Bld: 7.7 % — ABNORMAL HIGH (ref 4.8–5.6)

## 2019-07-17 LAB — TSH: TSH: 1.6 u[IU]/mL (ref 0.450–4.500)

## 2019-08-18 ENCOUNTER — Telehealth: Payer: Self-pay | Admitting: Adult Health Nurse Practitioner

## 2019-08-18 NOTE — Telephone Encounter (Signed)
Spoke with patient's wife  regarding Palliative services and she has requested that I call her back in a couple of weeks due to she has some medical issues going on and a lot of MD appointments and tests coming up.  I agreed to f/u with wife in a couple of weeks.

## 2019-09-02 ENCOUNTER — Telehealth: Payer: Self-pay | Admitting: Adult Health Nurse Practitioner

## 2019-09-02 NOTE — Telephone Encounter (Signed)
Called wife back to f/u with her about scheduling a Palliative Consult with her husband and after talking with her she stated that they now have found an abdominal mass in her they aren't able to do surgery and she has a f/u appointment today at the [facility] to find out how they are going to treat it.  I asked her if she would be interested in receiving Palliative services and she said yes, so I told her to mention this to the MD today and that I would call her back tomorrow to f/u and she was in agreement with this.

## 2019-09-04 ENCOUNTER — Telehealth: Payer: Self-pay | Admitting: Adult Health Nurse Practitioner

## 2019-09-04 NOTE — Telephone Encounter (Signed)
Spoke with patient's wife, Stanton Kidney, to see if she wanted to schedule a Palliative Consult for patient and she has declined services at this time.  I instructed her to contact patient's MD if they should change their mind later on and wanted Palliative services and she was in agreement with this.

## 2019-09-07 ENCOUNTER — Telehealth: Payer: Self-pay | Admitting: Adult Health Nurse Practitioner

## 2019-09-08 ENCOUNTER — Telehealth: Payer: Self-pay | Admitting: Adult Health Nurse Practitioner

## 2019-09-08 NOTE — Telephone Encounter (Signed)
Rec'd call from Charlotte Sanes, [place] Record Coordinator, stating she had rec'd a call from patient's wife stating that she needs help in the home.  Pamala Hurry explained to her that it sounded as if she needed Palliative services instead of Hospice. Pamala Hurry stated that the patient was confused and overwhelmed with everything that has been going on with her medically and suggested I contact her back and speak with her again.  Called and spoke with patient's wife and asked her about needing help in the home and she said yes.  I asked her if it was okay if I spoke with her son to explain our services with him and she agreed to this but she he wasn't home at the time.  I told her that I would contact him tomorrow morning and f/u with him and she was in agreement with this.

## 2019-09-08 NOTE — Telephone Encounter (Signed)
Spoke with patient's son, Octavia Bruckner, regarding Palliative services and all questions were answered and I rec'd verbal consent from patient's wife to start Palliative services.  I have scheduled an In-person Consult for 09/15/19 @ 10:30  AM.  Son asked if they could request a referral for his Mom, she has an appointment with Dr. Rogue Bussing tomorrow, 09/09/19, and I told him yes.  He wrote down the name of the company and said he would request.  I told him that if we rec'd the referral that we could see both of them next week and he was in agreement with this.

## 2019-09-09 ENCOUNTER — Other Ambulatory Visit: Payer: Self-pay | Admitting: Family Medicine

## 2019-09-09 DIAGNOSIS — E119 Type 2 diabetes mellitus without complications: Secondary | ICD-10-CM

## 2019-09-09 NOTE — Telephone Encounter (Signed)
Requested Prescriptions  Pending Prescriptions Disp Refills  . metFORMIN (GLUCOPHAGE) 1000 MG tablet [Pharmacy Med Name: METFORMIN HCL 1,000 MG TABLET] 90 tablet 1    Sig: TAKE 1 TABLET (1,000 MG TOTAL) BY MOUTH DAILY AFTER SUPPER.     Endocrinology:  Diabetes - Biguanides Failed - 09/09/2019  1:17 AM      Failed - Cr in normal range and within 360 days    Creatinine, Ser  Date Value Ref Range Status  07/16/2019 1.53 (H) 0.76 - 1.27 mg/dL Final         Failed - eGFR in normal range and within 360 days    GFR calc Af Amer  Date Value Ref Range Status  07/16/2019 46 (L) >59 mL/min/1.73 Final   GFR calc non Af Amer  Date Value Ref Range Status  07/16/2019 39 (L) >59 mL/min/1.73 Final         Passed - HBA1C is between 0 and 7.9 and within 180 days    Hgb A1c MFr Bld  Date Value Ref Range Status  07/16/2019 7.7 (H) 4.8 - 5.6 % Final    Comment:             Prediabetes: 5.7 - 6.4          Diabetes: >6.4          Glycemic control for adults with diabetes: <7.0          Passed - Valid encounter within last 6 months    Recent Outpatient Visits          1 month ago Annual physical exam   [facility] Jerrol Banana., MD   2 months ago [place], [place], [place]   7 months ago [place], [place], [place]   7 months ago Late onset Alzheimer's disease with behavioral disturbance [facility])   [facility] Jerrol Banana., MD   1 year ago Type 2 diabetes mellitus without complication, without long-term current use of insulin [facility])   [facility] Jerrol Banana., MD      Future Appointments            In 2 months Jerrol Banana., MD [facility], [place]   In 3 months Grayland Ormond Kathlene November, MD [facility]           Labs on 07/16/2019 reviewed by Dr. Rosanna Randy and are stable for this patient per lab  note.

## 2019-09-15 ENCOUNTER — Other Ambulatory Visit: Payer: Self-pay | Admitting: Adult Health Nurse Practitioner

## 2019-09-15 ENCOUNTER — Other Ambulatory Visit: Payer: Self-pay

## 2019-09-15 DIAGNOSIS — F02818 Dementia in other diseases classified elsewhere, unspecified severity, with other behavioral disturbance: Secondary | ICD-10-CM

## 2019-09-15 DIAGNOSIS — Z515 Encounter for palliative care: Secondary | ICD-10-CM

## 2019-09-15 DIAGNOSIS — F0281 Dementia in other diseases classified elsewhere with behavioral disturbance: Secondary | ICD-10-CM

## 2019-09-15 DIAGNOSIS — R918 Other nonspecific abnormal finding of lung field: Secondary | ICD-10-CM

## 2019-09-15 NOTE — Progress Notes (Signed)
[facility] Consult Note Telephone: [phone]  Fax: [phone]  PATIENT NAME: Joel Grant DOB: [birth year] MRN: [id number]  PRIMARY CARE PROVIDER:   Jerrol Banana., MD  REFERRING PROVIDER: Billey Chang NP  RESPONSIBLE PARTY:   Mattheus Rauls, white [phone] Shah Insley, son [phone]     RECOMMENDATIONS and PLAN:  1.  Advanced care planning.  Patient full code.  Started discussion on MOST form left MOST form with family to review together.  Son does state that his father does have a living well and is encouraged to email me a copy sit to be uploaded into epic.  2.  Right upper lobe lung mass.  Patient does have history of lung cancer.  Wife states that years ago he did undergo chemo and radiation.  Patient did undergo palliative XRT in June 2019.  He does get checked every 6 months by oncology with Dr. Grayland Ormond.  Currently is just under surveillance but last scan did show growth of the lung mass.  Continue follow-up and recommendations by Dr. Grayland Ormond.  3.  Alzheimer's dementia.  FAST 6c.  Patient is able to ambulate without assistive devices.  He does require assistance with bathing.  Is able to dress himself and to go to the bathroom by himself.  Is continent of bowel and bladder.  Does have occasional accidents.  Patient is able to feed himself and his appetite is good with no reported weight loss.  Patient has not had any falls.  Patient is unsteady on his feet at times with no reported falls.  Family states that patient sleeps well at night.  Patient does not have any anxiety/agitation.  Continue supportive care at home.  Patient stable at this time but this may change with increasing size of lung mass.  Denies pain, shortness of breath, cough, nausea, vomiting, diarrhea, constipation, headaches, dizziness.  Palliative will continue to monitor for symptom management/decline.  Will call in 4 weeks for follow-up for follow-up  appointment.  I spent 60 minutes providing this consultation,  from 12:00 to 1:00 including time spent with patient/family, chart review, provider coordination, documentation. More than 50% of the time in this consultation was spent coordinating communication.   HISTORY OF PRESENT ILLNESS:  Joel Grant is a 86 y.o. year old male with multiple medical problems including dementia, DMT2, lung mass to right upper lobe. Palliative Care was asked to help address goals of care.   CODE STATUS: see above  PPS: 50% HOSPICE ELIGIBILITY/DIAGNOSIS: TBD  PHYSICAL EXAM:  BP 120/56 HR 73 O2 99% on room air General: NAD, frail appearing Cardiovascular: regular rate and rhythm Pulmonary: Lung sounds clear; normal respiratory effort Abdomen: soft, nontender, + bowel sounds GU: no suprapubic tenderness Extremities: no edema, no joint deformities Skin: no rashes on exposed skin Neurological: Weakness; alert and oriented to person and place  PAST MEDICAL HISTORY:  Past Medical History:  Diagnosis Date  . 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 Dementia ([place])   . Diabetes mellitus without complication ([place])   . Dyspnea   . Pneumonia   . Psoriasis   . Tuberculosis    per wife as a child    SOCIAL HX:  Social History   Tobacco Use  . Smoking status: Former Smoker    Quit date: 02/20/1976    Years since quitting: 43.5  . Smokeless tobacco: Never Used  Substance Use Topics  .  Alcohol use: No    ALLERGIES:  Allergies  Allergen Reactions  . Ciprofloxacin Anaphylaxis  . Hydrocodone-Acetaminophen Nausea And Vomiting  . Penicillins     rash  . Sulfa Antibiotics      PERTINENT MEDICATIONS:  Outpatient Encounter Medications as of 09/15/2019  Medication Sig  . [employer] LANCETS ULTRA-THIN 30G MISC USE 1 LANCET TO CHECK SUGAR TWICE A DAY  . glucose blood (ACCU-CHEK AVIVA) test strip 1 each by Other route daily. Use as instructed  . metFORMIN (GLUCOPHAGE) 1000  MG tablet TAKE 1 TABLET (1,000 MG TOTAL) BY MOUTH DAILY AFTER SUPPER.   No facility-administered encounter medications on file as of 09/15/2019.      Vamsi Apfel Jenetta Downer, NP

## 2019-09-16 ENCOUNTER — Telehealth: Payer: Self-pay

## 2019-09-16 NOTE — Telephone Encounter (Signed)
Copied from [place] [id number]. Topic: General - Inquiry >> Sep 16, 2019 11:03 AM Richardo Priest, NT wrote: Reason for CRM: Patient's son Octavia Bruckner called in stating they would like PCP and possible his PA write a letter stating that patient has alzheimer's, as he is currently's wifes POA, however is not capable of making those decisions. [name] would like these letter whenever PCP has a chance and a call when ready to pick up. Please advise.

## 2019-09-17 ENCOUNTER — Encounter: Payer: Self-pay | Admitting: *Deleted

## 2019-09-17 NOTE — Telephone Encounter (Signed)
Please  write a note for me saying patient has Alzheimer's dementia and is unable to be power of attorney for his wife.

## 2019-09-17 NOTE — Telephone Encounter (Signed)
Patient's son was advised.

## 2019-09-17 NOTE — Telephone Encounter (Signed)
Letter written

## 2019-09-22 ENCOUNTER — Telehealth: Payer: Self-pay | Admitting: *Deleted

## 2019-09-22 ENCOUNTER — Encounter: Payer: Self-pay | Admitting: Emergency Medicine

## 2019-09-22 NOTE — Telephone Encounter (Signed)
Son called requesting a letter stating that this patient has Alzheimer's due to the fact that he has POA of wife Joel Grant and son is trying to get POA for care reasons. [name] states that he has a [employer]ar[employer] manager from PCP but he has to have a letter form 2 different doctors in order to get this taken care of.

## 2019-09-22 NOTE — Telephone Encounter (Signed)
That makes no sense why they would need 2 physicians.  I do not evaluate or treat him for that.  We can state that from medical records he carries this diagnosis.

## 2019-09-22 NOTE — Telephone Encounter (Signed)
I put it down at the desk

## 2019-09-22 NOTE — Telephone Encounter (Signed)
Joel Grant would like to pick letter up at registration desk by noon today

## 2019-09-22 NOTE — Telephone Encounter (Signed)
I have a letter that he can pick up, or can be faxed.

## 2019-10-12 ENCOUNTER — Ambulatory Visit: Payer: Medicare HMO | Admitting: Podiatry

## 2019-10-12 ENCOUNTER — Encounter: Payer: Self-pay | Admitting: Podiatry

## 2019-10-12 ENCOUNTER — Other Ambulatory Visit: Payer: Self-pay

## 2019-10-12 DIAGNOSIS — E119 Type 2 diabetes mellitus without complications: Secondary | ICD-10-CM | POA: Diagnosis not present

## 2019-10-12 DIAGNOSIS — M79674 Pain in right toe(s): Secondary | ICD-10-CM | POA: Diagnosis not present

## 2019-10-12 DIAGNOSIS — B351 Tinea unguium: Secondary | ICD-10-CM | POA: Diagnosis not present

## 2019-10-12 DIAGNOSIS — M79675 Pain in left toe(s): Secondary | ICD-10-CM | POA: Diagnosis not present

## 2019-10-12 NOTE — Progress Notes (Signed)
This patient returns to my office for at risk foot care.  This patient requires this care by a professional since this patient will be at risk due to having diabetic neuropathy.  He presents to the office with his son..   Patient was referred to this office for nail care.  This patient is unable to cut nails himself since the patient cannot reach his nails.These nails are painful walking and wearing shoes.  This patient presents for at risk foot care today.  General Appearance  Alert, conversant and in no acute stress.  Vascular  Dorsalis pedis and posterior tibial  pulses are palpable  bilaterally.  Capillary return is within normal limits  bilaterally. Temperature is within normal limits  bilaterally.  Neurologic  Deferred due to Alxheimer disease  Nails Thick disfigured discolored nails with subungual debris  from hallux to fifth toes bilaterally. No evidence of bacterial infection or drainage bilaterally.  Orthopedic  No limitations of motion  feet .  No crepitus or effusions noted.  No bony pathology or digital deformities noted.  Skin  normotropic skin with no porokeratosis noted bilaterally.  No signs of infections or ulcers noted.   Necrotic tissue 4th interspace right foot.  Onychomycosis  Pain in right toes  Pain in left toes  Consent was obtained for treatment procedures.   Mechanical debridement of nails 1-5  bilaterally performed with a nail nipper.  Filed with dremel without incident.    Return office visit   3 months                   Told patient to return for periodic foot care and evaluation due to potential at risk complications.   Gardiner Barefoot DPM

## 2019-11-16 NOTE — Progress Notes (Signed)
I,Joel Grant,acting as a scribe for Joel Durie, MD.,have documented all relevant documentation on the behalf of Joel Durie, MD,as directed by  Joel Durie, MD while in the presence of Joel Durie, MD.   Established patient visit   Patient: Joel Grant   DOB: Joel 12, 1928   86 y.o. Male  MRN: [id number] Visit Date: 11/18/2019  Today's healthcare provider: Wilhemena Durie, MD   Chief Complaint  Patient presents with  . Diabetes  . Follow-up   Subjective    HPI  Patient continues to live at home and is cared for by his son now.  His wife is in failing health with metastatic lymphoma. Diabetes Mellitus Type II, follow-up  Lab Results  Component Value Date   HGBA1C 8.3 (A) 11/18/2019   HGBA1C 7.7 (H) 07/16/2019   HGBA1C 7.6 (H) 01/15/2019   Last seen for diabetes 4 months ago.  Management since then includes continuing the same treatment. He reports good compliance with treatment. He is not having side effects. nonoe  Home blood sugar records: fasting range: not checking  Episodes of hypoglycemia? No none   Current insulin regiment: n/a Most Recent Eye Exam: due  --------------------------------------------------------------------  Late onset Alzheimer's Disease with Behavioral Disturbance  From 07/16/2019-Consult CCM for help for wife.  Also meet may need to fill out FL 2 for placement in memory care unit.      Medications: Outpatient Medications Prior to Visit  Medication Sig  . CVS LANCETS ULTRA-THIN 30G MISC USE 1 LANCET TO CHECK SUGAR TWICE A DAY  . glucose blood (ACCU-CHEK AVIVA) test strip 1 each by Other route daily. Use as instructed  . metFORMIN (GLUCOPHAGE) 1000 MG tablet TAKE 1 TABLET (1,000 MG TOTAL) BY MOUTH DAILY AFTER SUPPER.   No facility-administered medications prior to visit.    Review of Systems  Constitutional: Negative for appetite change, chills and fever.  Respiratory: Negative for chest tightness,  shortness of breath and wheezing.   Cardiovascular: Negative for chest pain and palpitations.  Gastrointestinal: Negative for abdominal pain, nausea and vomiting.    Last hemoglobin A1c Lab Results  Component Value Date   HGBA1C 8.3 (A) 11/18/2019      Objective    BP 115/68 (BP Location: Left Arm, Patient Position: Sitting, Cuff Size: Large)   Pulse 90   Temp 98.8 F (37.1 C) (Oral)   Resp 18   Ht 5\' 7"  (1.702 m)   Wt 192 lb (87.1 kg)   SpO2 97%   BMI 30.07 kg/m  BP Readings from Last 3 Encounters:  11/18/19 115/68  07/16/19 (!) 118/56  07/16/19 (!) 118/56   Wt Readings from Last 3 Encounters:  11/18/19 192 lb (87.1 kg)  07/16/19 193 lb (87.5 kg)  07/16/19 193 lb (87.5 kg)      Physical Exam Vitals reviewed.  Constitutional:      Appearance: He is well-developed.  HENT:     Head: Normocephalic and atraumatic.     Right Ear: External ear normal.     Left Ear: External ear normal.     Nose: Nose normal.  Eyes:     General: No scleral icterus. Neck:     Thyroid: No thyromegaly.  Cardiovascular:     Rate and Rhythm: Normal rate and regular rhythm.     Heart sounds: Normal heart sounds.  Pulmonary:     Effort: Pulmonary effort is normal.     Breath sounds: Normal breath sounds.  Abdominal:  Palpations: Abdomen is soft.  Skin:    General: Skin is warm and dry.  Neurological:     General: No focal deficit present.     Mental Status: He is alert.  Psychiatric:        Mood and Affect: Mood normal.       Results for orders placed or performed in visit on 11/18/19  POCT glycosylated hemoglobin (Hb A1C)  Result Value Ref Range   Hemoglobin A1C 8.3 (A) 4.0 - 5.6 %   Est. average glucose Bld gHb Est-mCnc 192     Assessment & Plan     1. Type 2 diabetes mellitus without complication, without long-term current use of insulin ([place]) Once he is 8.3.  Son to check his fasting blood sugar couple times a week so they know if it is trending up.  No evidence  of hypoglycemia. - POCT glycosylated hemoglobin (Hb A1C)  2. Late onset Alzheimer's disease with behavioral disturbance ([place]) Monitor for now.  Still living at home.  Not wandered yet.  3. Mass of upper lobe of right lung Followed by oncology.   No follow-ups on file.         Avilyn Virtue Cranford Mon, MD  [facility] [phone] (phone) [phone] (fax)  [place]

## 2019-11-18 ENCOUNTER — Encounter: Payer: Self-pay | Admitting: Family Medicine

## 2019-11-18 ENCOUNTER — Ambulatory Visit (INDEPENDENT_AMBULATORY_CARE_PROVIDER_SITE_OTHER): Payer: Medicare HMO | Admitting: Family Medicine

## 2019-11-18 ENCOUNTER — Other Ambulatory Visit: Payer: Self-pay

## 2019-11-18 VITALS — BP 115/68 | HR 90 | Temp 98.8°F | Resp 18 | Ht 67.0 in | Wt 192.0 lb

## 2019-11-18 DIAGNOSIS — R918 Other nonspecific abnormal finding of lung field: Secondary | ICD-10-CM | POA: Diagnosis not present

## 2019-11-18 DIAGNOSIS — F0281 Dementia in other diseases classified elsewhere with behavioral disturbance: Secondary | ICD-10-CM

## 2019-11-18 DIAGNOSIS — E119 Type 2 diabetes mellitus without complications: Secondary | ICD-10-CM

## 2019-11-18 DIAGNOSIS — G301 Alzheimer's disease with late onset: Secondary | ICD-10-CM | POA: Diagnosis not present

## 2019-11-18 DIAGNOSIS — F02818 Dementia in other diseases classified elsewhere, unspecified severity, with other behavioral disturbance: Secondary | ICD-10-CM

## 2019-11-18 LAB — POCT GLYCOSYLATED HEMOGLOBIN (HGB A1C)
Est. average glucose Bld gHb Est-mCnc: 192
Hemoglobin A1C: 8.3 % — AB (ref 4.0–5.6)

## 2019-12-14 NOTE — Progress Notes (Signed)
Trena Platt Dorrell Mitcheltree,acting as a scribe for Wilhemena Durie, MD.,have documented all relevant documentation on the behalf of Wilhemena Durie, MD,as directed by  Wilhemena Durie, MD while in the presence of Wilhemena Durie, MD.  Established patient visit   Patient: Joel Grant   DOB: 26-Dec-1926   86 y.o. Male  MRN: [id number] Visit Date: 12/15/2019  Today's healthcare provider: Wilhemena Durie, MD   Chief Complaint  Patient presents with  . Urinary Frequency   Subjective    Dysuria  This is a new problem. The quality of the pain is described as burning. There has been no fever. Associated symptoms include frequency. Pertinent negatives include no chills, flank pain, nausea or vomiting. He has tried nothing for the symptoms.    Dementia is stable His wife is ill with lymphoma so son is taking care of both of them.  Past Medical History:  Diagnosis Date  . 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 Dementia ([place])   . Diabetes mellitus without complication ([place])   . Dyspnea   . Pneumonia   . Psoriasis   . Tuberculosis    per wife as a child       Medications: Outpatient Medications Prior to Visit  Medication Sig  . CVS LANCETS ULTRA-THIN 30G MISC USE 1 LANCET TO CHECK SUGAR TWICE A DAY  . glucose blood (ACCU-CHEK AVIVA) test strip 1 each by Other route daily. Use as instructed  . metFORMIN (GLUCOPHAGE) 1000 MG tablet TAKE 1 TABLET (1,000 MG TOTAL) BY MOUTH DAILY AFTER SUPPER.   No facility-administered medications prior to visit.    Review of Systems  Constitutional: Negative for appetite change, chills and fever.  Respiratory: Negative for chest tightness, shortness of breath and wheezing.   Cardiovascular: Negative for chest pain and palpitations.  Gastrointestinal: Negative for abdominal pain, nausea and vomiting.  Genitourinary: Positive for dysuria and frequency. Negative for flank pain.    Last hemoglobin  A1c Lab Results  Component Value Date   HGBA1C 8.2 (A) 12/15/2019      Objective    BP 110/67 (BP Location: Right Arm, Patient Position: Sitting, Cuff Size: Large)   Pulse 92   Temp 98.1 F (36.7 C) (Oral)   Wt 193 lb 9.6 oz (87.8 kg)   BMI 30.32 kg/m  Wt Readings from Last 3 Encounters:  12/15/19 193 lb 9.6 oz (87.8 kg)  11/18/19 192 lb (87.1 kg)  07/16/19 193 lb (87.5 kg)      Physical Exam Vitals reviewed.  Constitutional:      Appearance: He is well-developed.  HENT:     Head: Normocephalic and atraumatic.     Right Ear: External ear normal.     Left Ear: External ear normal.     Nose: Nose normal.  Eyes:     General: No scleral icterus. Neck:     Thyroid: No thyromegaly.  Cardiovascular:     Rate and Rhythm: Normal rate and regular rhythm.     Heart sounds: Normal heart sounds.  Pulmonary:     Effort: Pulmonary effort is normal.     Breath sounds: Normal breath sounds.  Abdominal:     Palpations: Abdomen is soft.  Skin:    General: Skin is warm and dry.  Neurological:     General: No focal deficit present.     Mental Status: He is alert.  Psychiatric:  Mood and Affect: Mood normal.       Results for orders placed or performed in visit on 12/15/19  POCT urinalysis dipstick  Result Value Ref Range   Color, UA     Clarity, UA     Glucose, UA Negative Negative   Bilirubin, UA negative    Ketones, UA negative    Spec Grav, UA 1.015 1.010 - 1.025   Blood, UA negative    pH, UA 6.5 5.0 - 8.0   Protein, UA Positive (A) Negative   Urobilinogen, UA 0.2 0.2 or 1.0 E.U./dL   Nitrite, UA negative    Leukocytes, UA Negative Negative   Appearance     Odor    POCT HgB A1C  Result Value Ref Range   Hemoglobin A1C 8.2 (A) 4.0 - 5.6 %   Estimated Average Glucose 189     Assessment & Plan     1. Urinary frequency UA WNL. - POCT urinalysis dipstick  2. Type 2 diabetes mellitus without complication, without long-term current use of insulin  (HCC) No change in 86 yo with AD - POCT HgB A1C--8.2  today  3. Need for influenza vaccination  - Flu Vaccine QUAD High Dose(Fluad)  4. Late onset Alzheimer's disease with behavioral disturbance [facility]) May need placement.  5. Mass of upper lobe of right lung F/u with oncology   Return in about 5 months (around 05/16/2020).      I, Wilhemena Durie, MD, have reviewed all documentation for this visit. The documentation on 12/19/19 for the exam, diagnosis, procedures, and orders are all accurate and complete.    Richard Cranford Mon, MD  [facility] [phone] (phone) [phone] (fax)  [place]

## 2019-12-15 ENCOUNTER — Encounter: Payer: Self-pay | Admitting: Family Medicine

## 2019-12-15 ENCOUNTER — Ambulatory Visit (INDEPENDENT_AMBULATORY_CARE_PROVIDER_SITE_OTHER): Payer: Medicare HMO | Admitting: Family Medicine

## 2019-12-15 ENCOUNTER — Other Ambulatory Visit: Payer: Self-pay

## 2019-12-15 VITALS — BP 110/67 | HR 92 | Temp 98.1°F | Wt 193.6 lb

## 2019-12-15 DIAGNOSIS — E119 Type 2 diabetes mellitus without complications: Secondary | ICD-10-CM | POA: Diagnosis not present

## 2019-12-15 DIAGNOSIS — R350 Frequency of micturition: Secondary | ICD-10-CM

## 2019-12-15 DIAGNOSIS — G301 Alzheimer's disease with late onset: Secondary | ICD-10-CM | POA: Diagnosis not present

## 2019-12-15 DIAGNOSIS — F0281 Dementia in other diseases classified elsewhere with behavioral disturbance: Secondary | ICD-10-CM

## 2019-12-15 DIAGNOSIS — R918 Other nonspecific abnormal finding of lung field: Secondary | ICD-10-CM | POA: Diagnosis not present

## 2019-12-15 DIAGNOSIS — Z23 Encounter for immunization: Secondary | ICD-10-CM

## 2019-12-15 DIAGNOSIS — F02818 Dementia in other diseases classified elsewhere, unspecified severity, with other behavioral disturbance: Secondary | ICD-10-CM

## 2019-12-15 LAB — POCT URINALYSIS DIPSTICK
Bilirubin, UA: NEGATIVE
Blood, UA: NEGATIVE
Glucose, UA: NEGATIVE
Ketones, UA: NEGATIVE
Leukocytes, UA: NEGATIVE
Nitrite, UA: NEGATIVE
Protein, UA: POSITIVE — AB
Spec Grav, UA: 1.015 (ref 1.010–1.025)
Urobilinogen, UA: 0.2 E.U./dL
pH, UA: 6.5 (ref 5.0–8.0)

## 2019-12-15 LAB — POCT GLYCOSYLATED HEMOGLOBIN (HGB A1C)
Estimated Average Glucose: 189
Hemoglobin A1C: 8.2 % — AB (ref 4.0–5.6)

## 2019-12-16 ENCOUNTER — Other Ambulatory Visit: Payer: Self-pay

## 2019-12-16 ENCOUNTER — Inpatient Hospital Stay: Payer: Medicare HMO | Attending: Oncology

## 2019-12-16 DIAGNOSIS — E119 Type 2 diabetes mellitus without complications: Secondary | ICD-10-CM | POA: Insufficient documentation

## 2019-12-16 DIAGNOSIS — F0390 Unspecified dementia without behavioral disturbance: Secondary | ICD-10-CM | POA: Diagnosis not present

## 2019-12-16 DIAGNOSIS — C3490 Malignant neoplasm of unspecified part of unspecified bronchus or lung: Secondary | ICD-10-CM | POA: Insufficient documentation

## 2019-12-16 DIAGNOSIS — R918 Other nonspecific abnormal finding of lung field: Secondary | ICD-10-CM

## 2019-12-16 LAB — CBC WITH DIFFERENTIAL/PLATELET
Abs Immature Granulocytes: 0.03 10*3/uL (ref 0.00–0.07)
Basophils Absolute: 0 10*3/uL (ref 0.0–0.1)
Basophils Relative: 0 %
Eosinophils Absolute: 0.3 10*3/uL (ref 0.0–0.5)
Eosinophils Relative: 4 %
HCT: 33.4 % — ABNORMAL LOW (ref 39.0–52.0)
Hemoglobin: 11.8 g/dL — ABNORMAL LOW (ref 13.0–17.0)
Immature Granulocytes: 0 %
Lymphocytes Relative: 19 %
Lymphs Abs: 1.8 10*3/uL (ref 0.7–4.0)
MCH: 30 pg (ref 26.0–34.0)
MCHC: 35.3 g/dL (ref 30.0–36.0)
MCV: 85 fL (ref 80.0–100.0)
Monocytes Absolute: 0.8 10*3/uL (ref 0.1–1.0)
Monocytes Relative: 8 %
Neutro Abs: 6.5 10*3/uL (ref 1.7–7.7)
Neutrophils Relative %: 69 %
Platelets: 259 10*3/uL (ref 150–400)
RBC: 3.93 MIL/uL — ABNORMAL LOW (ref 4.22–5.81)
RDW: 13.7 % (ref 11.5–15.5)
WBC: 9.5 10*3/uL (ref 4.0–10.5)
nRBC: 0 % (ref 0.0–0.2)

## 2019-12-16 LAB — BASIC METABOLIC PANEL
Anion gap: 9 (ref 5–15)
BUN: 26 mg/dL — ABNORMAL HIGH (ref 8–23)
CO2: 26 mmol/L (ref 22–32)
Calcium: 8.8 mg/dL — ABNORMAL LOW (ref 8.9–10.3)
Chloride: 100 mmol/L (ref 98–111)
Creatinine, Ser: 1.56 mg/dL — ABNORMAL HIGH (ref 0.61–1.24)
GFR calc Af Amer: 45 mL/min — ABNORMAL LOW (ref >60)
GFR calc non Af Amer: 39 mL/min — ABNORMAL LOW (ref >60)
Glucose, Bld: 204 mg/dL — ABNORMAL HIGH (ref 70–99)
Potassium: 4.5 mmol/L (ref 3.5–5.1)
Sodium: 135 mmol/L (ref 135–145)

## 2019-12-18 ENCOUNTER — Ambulatory Visit
Admission: RE | Admit: 2019-12-18 | Discharge: 2019-12-18 | Disposition: A | Discharge status: Home / Self Care | Payer: Medicare HMO | Source: Ambulatory Visit | Attending: Oncology | Admitting: Oncology

## 2019-12-18 ENCOUNTER — Ambulatory Visit: Payer: Medicare HMO

## 2019-12-18 ENCOUNTER — Other Ambulatory Visit: Payer: Self-pay

## 2019-12-18 ENCOUNTER — Telehealth: Payer: Self-pay

## 2019-12-18 DIAGNOSIS — I700 Atherosclerosis of aorta: Secondary | ICD-10-CM | POA: Diagnosis not present

## 2019-12-18 DIAGNOSIS — I2510 Atherosclerotic heart disease of native coronary artery without angina pectoris: Secondary | ICD-10-CM | POA: Diagnosis not present

## 2019-12-18 DIAGNOSIS — R918 Other nonspecific abnormal finding of lung field: Secondary | ICD-10-CM | POA: Diagnosis not present

## 2019-12-18 DIAGNOSIS — C3490 Malignant neoplasm of unspecified part of unspecified bronchus or lung: Secondary | ICD-10-CM | POA: Diagnosis not present

## 2019-12-18 DIAGNOSIS — J90 Pleural effusion, not elsewhere classified: Secondary | ICD-10-CM | POA: Diagnosis not present

## 2019-12-18 LAB — POCT I-STAT CREATININE: Creatinine, Ser: 1.6 mg/dL — ABNORMAL HIGH (ref 0.61–1.24)

## 2019-12-18 MED ORDER — IOHEXOL 300 MG/ML  SOLN
75.0000 mL | Freq: Once | INTRAMUSCULAR | Status: AC | PRN
  Administered 2019-12-18: 60 mL via INTRAVENOUS

## 2019-12-18 NOTE — Telephone Encounter (Signed)
Palliative care support call from volunteer made, no answer

## 2019-12-18 NOTE — Progress Notes (Signed)
[place]  [phone] [phone]  ID: Joel Grant OB: [birth year]  MR#: [id number]  SAY#:[id number]  Patient Care Team: Jerrol Banana., MD as PCP - General (Family Medicine) Lloyd Huger, MD as Consulting Physician (Oncology) Noreene Filbert, MD as Referring Physician (Radiation Oncology) Gardiner Barefoot, DPM as Consulting Physician (Podiatry)  I connected with Joel Grant on 12/21/19 at  3:30 PM EDT by video enabled telemedicine visit and verified that I am speaking with the correct person using two identifiers.   I discussed the limitations, risks, security and privacy concerns of performing an evaluation and management service by telemedicine and the availability of in-person appointments. I also discussed with the patient that there may be a patient responsible charge related to this service. The patient expressed understanding and agreed to proceed.   Other persons participating in the visit and their role in the encounter: Patient, patient's son, MD.  Patient's location: Home. Provider's location: Clinic.  CHIEF COMPLAINT: Right upper lobe lung mass.  INTERVAL HISTORY: Patient son and wife agreed to video enabled telemedicine visit for further evaluation and discussion of patient's imaging results.  While patient was on camera, he did not respond to questions and the entire history is given by his son.  He reports his dementia is approximately baseline, but he spends much of the day sleeping in the chair. He has no neurologic complaints.  There is no report of any recent fever or illness.  He has a good appetite and denies weight loss. He denies any chest pain, shortness of breath, cough, or hemoptysis.  He has no nausea, vomiting, constipation, or diarrhea. He has no urinary complaints.  No further complaints are reported.  REVIEW OF SYSTEMS:   Review of Systems  Unable to perform ROS: Dementia   PAST MEDICAL  HISTORY: Past Medical History:  Diagnosis Date  . 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 Dementia ([place])   . Diabetes mellitus without complication ([place])   . Dyspnea   . Pneumonia   . Psoriasis   . Tuberculosis    per wife as a child    PAST SURGICAL HISTORY: Past Surgical History:  Procedure Laterality Date  . CATARACT EXTRACTION      FAMILY HISTORY: Family History  Problem Relation Age of Onset  . Heart disease Mother   . Dementia Mother   . Diabetes Father   . Heart disease Father   . Peripheral vascular disease Brother   . Diabetes Brother     ADVANCED DIRECTIVES (Y/N):  N  HEALTH MAINTENANCE: Social History   Tobacco Use  . Smoking status: Former Smoker    Quit date: 02/20/1976    Years since quitting: 43.8  . Smokeless tobacco: Never Used  Vaping Use  . Vaping Use: Never used  Substance Use Topics  . Alcohol use: No  . Drug use: No     Colonoscopy:  PAP:  Bone density:  Lipid panel:  Allergies  Allergen Reactions  . Ciprofloxacin Anaphylaxis  . Hydrocodone-Acetaminophen Nausea And Vomiting  . Penicillins     rash  . Sulfa Antibiotics     Current Outpatient Medications  Medication Sig Dispense Refill  . CVS LANCETS ULTRA-THIN 30G MISC USE 1 LANCET TO CHECK SUGAR TWICE A DAY 200 each 12  . glucose blood (ACCU-CHEK AVIVA) test strip 1 each by Other route daily. Use as instructed 100 each 12  . metFORMIN (GLUCOPHAGE)  1000 MG tablet TAKE 1 TABLET (1,000 MG TOTAL) BY MOUTH DAILY AFTER SUPPER. 90 tablet 1   No current facility-administered medications for this visit.    OBJECTIVE: There were no vitals filed for this visit.   There is no height or weight on file to calculate BMI.    ECOG FS:2 - Symptomatic, <50% confined to bed   General: Well-developed, well-nourished, no acute distress. HEENT: Normocephalic. Neuro: Confused. Cranial nerves grossly intact. Psych: Flat affect.  LAB RESULTS:  Lab  Results  Component Value Date   NA 135 12/16/2019   K 4.5 12/16/2019   CL 100 12/16/2019   CO2 26 12/16/2019   GLUCOSE 204 (H) 12/16/2019   BUN 26 (H) 12/16/2019   CREATININE 1.60 (H) 12/18/2019   CALCIUM 8.8 (L) 12/16/2019   PROT 6.4 07/16/2019   ALBUMIN 4.0 07/16/2019   AST 17 07/16/2019   ALT 14 07/16/2019   ALKPHOS 66 07/16/2019   BILITOT 0.3 07/16/2019   GFRNONAA 39 (L) 12/16/2019   GFRAA 45 (L) 12/16/2019    Lab Results  Component Value Date   WBC 9.5 12/16/2019   NEUTROABS 6.5 12/16/2019   HGB 11.8 (L) 12/16/2019   HCT 33.4 (L) 12/16/2019   MCV 85.0 12/16/2019   PLT 259 12/16/2019     STUDIES: CT Chest W Contrast  Result Date: 12/18/2019 CLINICAL DATA:  Restaging of lung cancer, follow-up for mass in the RIGHT chest EXAM: CT CHEST WITH CONTRAST TECHNIQUE: Multidetector CT imaging of the chest was performed during intravenous contrast administration. CONTRAST:  41mL OMNIPAQUE IOHEXOL 300 MG/ML  SOLN COMPARISON:  12/10/2018 and June 16, 2019 FINDINGS: Cardiovascular: Calcified and noncalcified atheromatous plaque of the thoracic aorta. No aneurysmal dilation. No change in appearance since previous imaging. Central pulmonary arteries are unremarkable on venous phase assessment. No pericardial effusion or nodularity. Heart size is stable with signs of coronary artery disease. Mediastinum/Nodes: Bulky necrotic appearing subcarinal adenopathy 2 cm short axis previously 1.1 cm. Scattered hyperenhancing lymph nodes less than a cm including in the AP window on the LEFT where a 9 mm lymph node shows increased enhancement and rounded morphology No gross LEFT hilar adenopathy. The esophagus mildly thickened. No axillary or supraclavicular adenopathy. Lungs/Pleura: Large mass amidst post treatment changes in the RIGHT upper lobe extends from the RIGHT hilum into the RIGHT upper lobe showing heterogeneous enhancement and abutting the pleura along the medial chest and extending towards the  pleural surface of the RIGHT apex. Thickening of the pleura in this area does not allow for exclusion of parietal pleural involvement and early chest wall extension though there are changes of radiation which confound assessment. There is frank extension of soft tissue and extrapleural fat with lobulated borders seen on image 40 of series 2. Mass measures 8.4 x 5.9 x 7.7 cm, at best on prior exam masslike area omits treatment changes 5.8 x 3.6 cm. Interval development of a small RIGHT-sided pleural effusion since the prior study. Mass and post treatment changes cross the major fissure in the RIGHT chest just above the hilum. Subtle ground-glass nodule seen in the LEFT lung base 9 mm (image 105, series 3) Subtle ground-glass nodule in the RIGHT upper lobe (image 35, series 3) 3 mm. Signs of pulmonary granulomatous disease. Grouped nodules in the LEFT upper lobe, in total measuring approximately 15 mm more confluent than on the prior study. Airways are occluded to RIGHT upper lobe anteriorly. Patent elsewhere in the chest. Upper Abdomen: Hepatic steatosis, marked hepatic steatosis. No  focal liver lesion within imaged portions of the liver. The imaged portions of upper abdominal viscera otherwise unremarkable. Musculoskeletal: Spinal degenerative changes. No acute or destructive bone finding. IMPRESSION: 1. Heterogeneous mass amidst post treatment changes extends from the RIGHT hilum into the peripheral RIGHT upper chest with areas suspicious for involvement of extrapleural fat. Post treatment changes confound subtle chest wall assessment. Mass and post treatment changes cross the major fissure near the RIGHT hilum. 2. Mediastinal adenopathy as discussed with bulky subcarinal adenopathy showing necrosis and hyperenhancing AP window and LEFT paratracheal adenopathy. 3. Interval development of a small RIGHT-sided pleural effusion. 4. Grouped nodules in the LEFT upper lobe, in total measuring approximately 15 mm more  confluent than on the prior study. Findings along with areas of ground-glass nodules raising the question of multifocal bronchogenic carcinoma. 5. Hepatic steatosis. 6. Aortic atherosclerosis. Aortic Atherosclerosis (ICD10-I70.0). Electronically Signed   By: Zetta Bills M.D.   On: 12/18/2019 15:44    ASSESSMENT: Right upper lobe lung mass.  PLAN:    1. Right upper lobe lung mass: Although no biopsy was able to be performed, given the continued growth, this is highly suspicious for underlying malignancy and patient underwent palliative XRT completing in June 2019.  Patient's most recent CT scan on December 18, 2019 reviewed independently and reported above appear to report continued growth of mass, but posttreatment changes confound accurate assessment.  Patient likely has continued progression of disease, but given his advanced age and worsening dementia it was agreed upon that no further imaging or follow-up is necessary.    I provided 20 minutes of face-to-face video visit time during this encounter which included chart review, counseling, and coordination of care as documented above.   Patient expressed understanding and was in agreement with this plan. He also understands that He can call clinic at any time with any questions, concerns, or complaints.    Lloyd Huger, MD   12/21/2019 4:10 PM

## 2019-12-21 ENCOUNTER — Encounter: Payer: Self-pay | Admitting: Oncology

## 2019-12-21 ENCOUNTER — Inpatient Hospital Stay (HOSPITAL_BASED_OUTPATIENT_CLINIC_OR_DEPARTMENT_OTHER): Payer: Medicare HMO | Admitting: Oncology

## 2019-12-21 DIAGNOSIS — R918 Other nonspecific abnormal finding of lung field: Secondary | ICD-10-CM | POA: Diagnosis not present

## 2019-12-21 NOTE — Progress Notes (Signed)
Patient's son denies patient is having any pain or concerns at this time.

## 2020-01-11 ENCOUNTER — Ambulatory Visit: Payer: Medicare HMO | Attending: Internal Medicine

## 2020-01-11 DIAGNOSIS — Z23 Encounter for immunization: Secondary | ICD-10-CM

## 2020-01-11 NOTE — Progress Notes (Signed)
   Covid-19 Vaccination Clinic  Name:  Joel Grant    MRN: [id number] DOB: 10-Feb-1927  01/11/2020  Joel Grant was observed post Covid-19 immunization for 15 minutes without incident. He was provided with Vaccine Information Sheet and instruction to access the V-Safe system.   Joel Grant was instructed to call 911 with any severe reactions post vaccine: Marland Kitchen Difficulty breathing  . Swelling of face and throat  . A fast heartbeat  . A bad rash all over body  . Dizziness and weakness

## 2020-01-18 ENCOUNTER — Encounter: Payer: Self-pay | Admitting: Podiatry

## 2020-01-18 ENCOUNTER — Ambulatory Visit: Payer: Medicare HMO | Admitting: Podiatry

## 2020-01-18 ENCOUNTER — Other Ambulatory Visit: Payer: Self-pay

## 2020-01-18 DIAGNOSIS — M79674 Pain in right toe(s): Secondary | ICD-10-CM

## 2020-01-18 DIAGNOSIS — M79675 Pain in left toe(s): Secondary | ICD-10-CM | POA: Diagnosis not present

## 2020-01-18 DIAGNOSIS — B351 Tinea unguium: Secondary | ICD-10-CM | POA: Diagnosis not present

## 2020-01-18 DIAGNOSIS — E1140 Type 2 diabetes mellitus with diabetic neuropathy, unspecified: Secondary | ICD-10-CM | POA: Diagnosis not present

## 2020-01-18 DIAGNOSIS — E119 Type 2 diabetes mellitus without complications: Secondary | ICD-10-CM | POA: Diagnosis not present

## 2020-01-18 NOTE — Progress Notes (Signed)
This patient returns to my office for at risk foot care.  This patient requires this care by a professional since this patient will be at risk due to having diabetic neuropathy.  He presents to the office with his son..   Patient was referred to this office for nail care.  This patient is unable to cut nails himself since the patient cannot reach his nails.These nails are painful walking and wearing shoes.  This patient presents for at risk foot care today.  General Appearance  Alert, conversant and in no acute stress.  Vascular  Dorsalis pedis and posterior tibial  pulses are palpable  bilaterally.  Capillary return is within normal limits  bilaterally. Temperature is within normal limits  bilaterally.  Neurologic  Deferred due to Alxheimer disease  Nails Thick disfigured discolored nails with subungual debris  from hallux to fifth toes bilaterally. No evidence of bacterial infection or drainage bilaterally.  Orthopedic  No limitations of motion  feet .  No crepitus or effusions noted.  No bony pathology or digital deformities noted.  Skin  normotropic skin with no porokeratosis noted bilaterally.  No signs of infections or ulcers noted.   Necrotic tissue 4th interspace right foot.  Onychomycosis  Pain in right toes  Pain in left toes  Consent was obtained for treatment procedures.   Mechanical debridement of nails 1-5  bilaterally performed with a nail nipper.  Filed with dremel without incident.    Return office visit   4 months                   Told patient to return for periodic foot care and evaluation due to potential at risk complications.   Gardiner Barefoot DPM

## 2020-03-18 ENCOUNTER — Telehealth: Payer: Self-pay

## 2020-03-18 NOTE — Telephone Encounter (Signed)
Volunteer called patient on behalf of Palliative Care and did not get a answer from patient/family. ° °

## 2020-03-18 NOTE — Progress Notes (Signed)
Established patient visit   Patient: Joel Grant   DOB: 1926-07-21   86 y.o. Male  MRN: [id number] Visit Date: 03/21/2020  Today's healthcare provider: Wilhemena Durie, MD   Chief Complaint  Patient presents with  . Diabetes   Subjective    HPI  Patient with progressive dementia still living at home. His wife is developed lymphoma in the past years so the son is caring for both of them. They are actually doing okay. He has had no behavioral issues. Eating well. He does not follow any type of diet. He is having some stronger smelling urine and some urinary and fecal incontinence that is mildly progressive. Diabetes Mellitus Type II, Follow-up  Lab Results  Component Value Date   HGBA1C 9.0 (A) 03/21/2020   HGBA1C 8.2 (A) 12/15/2019   HGBA1C 8.3 (A) 11/18/2019   Wt Readings from Last 3 Encounters:  12/15/19 193 lb 9.6 oz (87.8 kg)  11/18/19 192 lb (87.1 kg)  07/16/19 193 lb (87.5 kg)   Last seen for diabetes 3 months ago.  Management since then includes continue same medications. He reports good compliance with treatment. He is not having side effects.  Symptoms: No fatigue No foot ulcerations  No appetite changes No nausea  No paresthesia of the feet  No polydipsia  No polyuria No visual disturbances   No vomiting     Home blood sugar records: trend: fluctuating a bit  Episodes of hypoglycemia? No    Current insulin regiment: none Most Recent Eye Exam: not UTD Current exercise: none Current diet habits: well balanced  Pertinent Labs: Lab Results  Component Value Date   CHOL 159 05/14/2017   HDL 32 (L) 05/14/2017   LDLCALC 77 05/14/2017   TRIG 248 (H) 05/14/2017   Lab Results  Component Value Date   NA 135 12/16/2019   K 4.5 12/16/2019   CREATININE 1.60 (H) 12/18/2019   GFRNONAA 39 (L) 12/16/2019   GFRAA 45 (L) 12/16/2019   GLUCOSE 204 (H) 12/16/2019         Medications: Outpatient Medications Prior to Visit  Medication Sig  . CVS  LANCETS ULTRA-THIN 30G MISC USE 1 LANCET TO CHECK SUGAR TWICE A DAY  . glucose blood (ACCU-CHEK AVIVA) test strip 1 each by Other route daily. Use as instructed  . metFORMIN (GLUCOPHAGE) 1000 MG tablet TAKE 1 TABLET (1,000 MG TOTAL) BY MOUTH DAILY AFTER SUPPER.   No facility-administered medications prior to visit.    Review of Systems  Constitutional: Negative for appetite change, chills and fever.  Respiratory: Negative for chest tightness, shortness of breath and wheezing.   Cardiovascular: Negative for chest pain and palpitations.  Gastrointestinal: Negative for abdominal pain, nausea and vomiting.    Last hemoglobin A1c Lab Results  Component Value Date   HGBA1C 9.0 (A) 03/21/2020      Objective    There were no vitals taken for this visit. BP Readings from Last 3 Encounters:  03/21/20 (!) 124/58  12/15/19 110/67  11/18/19 115/68   Wt Readings from Last 3 Encounters:  03/21/20 191 lb (86.6 kg)  12/15/19 193 lb 9.6 oz (87.8 kg)  11/18/19 192 lb (87.1 kg)      Physical Exam Vitals reviewed.  Constitutional:      Appearance: He is well-developed.  HENT:     Head: Normocephalic and atraumatic.     Right Ear: External ear normal.     Left Ear: External ear normal.  Nose: Nose normal.  Eyes:     General: No scleral icterus. Neck:     Thyroid: No thyromegaly.  Cardiovascular:     Rate and Rhythm: Normal rate and regular rhythm.     Heart sounds: Normal heart sounds.  Pulmonary:     Effort: Pulmonary effort is normal.     Breath sounds: Normal breath sounds.  Abdominal:     Palpations: Abdomen is soft.     Hernia: A hernia is present.  Skin:    General: Skin is warm and dry.  Neurological:     General: No focal deficit present.     Mental Status: He is alert.  Psychiatric:        Mood and Affect: Mood normal.       Results for orders placed or performed in visit on 03/21/20  POCT HgB A1C  Result Value Ref Range   Hemoglobin A1C 9.0 (A) 4.0 - 5.6  %   HbA1c POC (<> result, manual entry)     HbA1c, POC (prediabetic range)     HbA1c, POC (controlled diabetic range)      Assessment & Plan     1. Type 2 diabetes mellitus without complication, without long-term current use of insulin (HCC) A1c of 9.66 in this 86 year old with Alzheimer's disease.  It is to be hard for family to check his sugar he does have some behavioral disturbance with lack of poor preparation.  No hypoglycemia so we will not treat presently. - POCT HgB A1C  2. Urinary incontinence, unspecified type This is infection but more likely related to his dementia. - Urine Culture - Urinalysis, Routine w reflex microscopic  3. Late onset Alzheimer's dementia with behavioral disturbance ([place]) MSE on next visit.  4. Mass of upper lobe of right lung By oncology.  I do not think work-up or treatment is appropriate unless it is palliative.  5. Adult hypothyroidism    No follow-ups on file.         Heylee Tant Cranford Mon, MD  [facility] [phone] (phone) [phone] (fax)  [place]

## 2020-03-21 ENCOUNTER — Other Ambulatory Visit: Payer: Self-pay

## 2020-03-21 ENCOUNTER — Encounter: Payer: Self-pay | Admitting: Family Medicine

## 2020-03-21 ENCOUNTER — Ambulatory Visit (INDEPENDENT_AMBULATORY_CARE_PROVIDER_SITE_OTHER): Payer: Medicare HMO | Admitting: Family Medicine

## 2020-03-21 VITALS — BP 124/58 | HR 67 | Temp 98.0°F | Resp 16 | Wt 191.0 lb

## 2020-03-21 DIAGNOSIS — R32 Unspecified urinary incontinence: Secondary | ICD-10-CM | POA: Diagnosis not present

## 2020-03-21 DIAGNOSIS — E119 Type 2 diabetes mellitus without complications: Secondary | ICD-10-CM

## 2020-03-21 DIAGNOSIS — F0281 Dementia in other diseases classified elsewhere with behavioral disturbance: Secondary | ICD-10-CM

## 2020-03-21 DIAGNOSIS — G301 Alzheimer's disease with late onset: Secondary | ICD-10-CM

## 2020-03-21 DIAGNOSIS — E039 Hypothyroidism, unspecified: Secondary | ICD-10-CM | POA: Diagnosis not present

## 2020-03-21 DIAGNOSIS — R918 Other nonspecific abnormal finding of lung field: Secondary | ICD-10-CM | POA: Diagnosis not present

## 2020-03-21 LAB — POCT GLYCOSYLATED HEMOGLOBIN (HGB A1C): Hemoglobin A1C: 9 % — AB (ref 4.0–5.6)

## 2020-03-22 LAB — URINALYSIS, ROUTINE W REFLEX MICROSCOPIC
Bilirubin, UA: NEGATIVE
Ketones, UA: NEGATIVE
Leukocytes,UA: NEGATIVE
Nitrite, UA: NEGATIVE
RBC, UA: NEGATIVE
Specific Gravity, UA: 1.023 (ref 1.005–1.030)
Urobilinogen, Ur: 0.2 mg/dL (ref 0.2–1.0)
pH, UA: 5.5 (ref 5.0–7.5)

## 2020-03-22 LAB — MICROSCOPIC EXAMINATION
Bacteria, UA: NONE SEEN
Casts: NONE SEEN /lpf
Epithelial Cells (non renal): NONE SEEN /hpf (ref 0–10)
RBC, Urine: NONE SEEN /hpf (ref 0–2)
WBC, UA: NONE SEEN /hpf (ref 0–5)

## 2020-03-23 LAB — URINE CULTURE

## 2020-04-01 ENCOUNTER — Other Ambulatory Visit: Payer: Self-pay | Admitting: Family Medicine

## 2020-04-01 DIAGNOSIS — E119 Type 2 diabetes mellitus without complications: Secondary | ICD-10-CM

## 2020-05-04 ENCOUNTER — Ambulatory Visit (INDEPENDENT_AMBULATORY_CARE_PROVIDER_SITE_OTHER): Payer: Medicare HMO | Admitting: Family Medicine

## 2020-05-04 ENCOUNTER — Other Ambulatory Visit: Payer: Self-pay

## 2020-05-04 ENCOUNTER — Ambulatory Visit
Admission: RE | Admit: 2020-05-04 | Discharge: 2020-05-04 | Disposition: A | Discharge status: Home / Self Care | Payer: Medicare HMO | Source: Ambulatory Visit | Attending: Family Medicine | Admitting: Family Medicine

## 2020-05-04 ENCOUNTER — Encounter: Payer: Self-pay | Admitting: Family Medicine

## 2020-05-04 ENCOUNTER — Ambulatory Visit
Admission: RE | Admit: 2020-05-04 | Discharge: 2020-05-04 | Disposition: A | Discharge status: Home / Self Care | Payer: Medicare HMO | Attending: Family Medicine | Admitting: Family Medicine

## 2020-05-04 VITALS — BP 106/69 | HR 97 | Temp 98.2°F | Resp 16 | Wt 194.0 lb

## 2020-05-04 DIAGNOSIS — R062 Wheezing: Secondary | ICD-10-CM | POA: Diagnosis not present

## 2020-05-04 DIAGNOSIS — J189 Pneumonia, unspecified organism: Secondary | ICD-10-CM | POA: Insufficient documentation

## 2020-05-04 DIAGNOSIS — R918 Other nonspecific abnormal finding of lung field: Secondary | ICD-10-CM | POA: Insufficient documentation

## 2020-05-04 DIAGNOSIS — F0281 Dementia in other diseases classified elsewhere with behavioral disturbance: Secondary | ICD-10-CM

## 2020-05-04 DIAGNOSIS — G301 Alzheimer's disease with late onset: Secondary | ICD-10-CM

## 2020-05-04 MED ORDER — AZITHROMYCIN 250 MG PO TABS: ORAL_TABLET | ORAL | 0 refills | Status: AC

## 2020-05-04 NOTE — Progress Notes (Signed)
Established patient visit   Patient: Joel Grant   DOB: 12/24/1926   87 y.o. Male  MRN: [id number] Visit Date: 05/04/2020  Today's healthcare provider: Wilhemena Durie, MD   Chief Complaint  Patient presents with  . Wheezing   Subjective    HPI  Patient here today with his son and C/O wheezing especially when he is straning. Patient denies any cough, fever or chills.  His dementia continues to progress.  He is still able to eat.  He has had no recent falls.  Patient Active Problem List   Diagnosis Date Noted  . Pain due to onychomycosis of toenails of both feet 07/13/2019  . Rash and nonspecific skin eruption 01/14/2017  . Mass of upper lobe of right lung 02/05/2016  . Sepsis ([place]) 01/28/2016  . Adenomatous colon polyp 12/09/2014  . Alzheimer's dementia ([place]) 12/09/2014  . 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 Diabetes ([place]) 12/09/2014  . Glaucoma 12/09/2014  . Adult hypothyroidism 12/09/2014  . Adiposity 12/09/2014  . Arthritis, degenerative 12/09/2014  . Hypercholesterolemia without hypertriglyceridemia 12/09/2014  . Avitaminosis D 12/09/2014   Social History   Tobacco Use  . Smoking status: Former Smoker    Quit date: 02/20/1976    Years since quitting: 44.2  . Smokeless tobacco: Never Used  Vaping Use  . Vaping Use: Never used  Substance Use Topics  . Alcohol use: No  . Drug use: No   Allergies  Allergen Reactions  . Ciprofloxacin Anaphylaxis  . Hydrocodone-Acetaminophen Nausea And Vomiting  . Penicillins     rash  . Sulfa Antibiotics        Medications: Outpatient Medications Prior to Visit  Medication Sig  . CVS LANCETS ULTRA-THIN 30G MISC USE 1 LANCET TO CHECK SUGAR TWICE A DAY  . glucose blood (ACCU-CHEK AVIVA) test strip 1 each by Other route daily. Use as instructed  . metFORMIN (GLUCOPHAGE) 1000 MG tablet TAKE 1 TABLET (1,000 MG TOTAL) BY MOUTH DAILY AFTER SUPPER.   No facility-administered medications prior to visit.     Review of Systems  Last CBC Lab Results  Component Value Date   WBC 9.5 12/16/2019   HGB 11.8 (L) 12/16/2019   HCT 33.4 (L) 12/16/2019   MCV 85.0 12/16/2019   MCH 30.0 12/16/2019   RDW 13.7 12/16/2019   PLT 259 01/60/1093   Last metabolic panel Lab Results  Component Value Date   GLUCOSE 204 (H) 12/16/2019   NA 135 12/16/2019   K 4.5 12/16/2019   CL 100 12/16/2019   CO2 26 12/16/2019   BUN 26 (H) 12/16/2019   CREATININE 1.60 (H) 12/18/2019   GFRNONAA 39 (L) 12/16/2019   GFRAA 45 (L) 12/16/2019   CALCIUM 8.8 (L) 12/16/2019   PHOS 3.5 02/06/2016   PROT 6.4 07/16/2019   ALBUMIN 4.0 07/16/2019   LABGLOB 2.4 07/16/2019   AGRATIO 1.7 07/16/2019   BILITOT 0.3 07/16/2019   ALKPHOS 66 07/16/2019   AST 17 07/16/2019   ALT 14 07/16/2019   ANIONGAP 9 12/16/2019   Last lipids Lab Results  Component Value Date   CHOL 159 05/14/2017   HDL 32 (L) 05/14/2017   LDLCALC 77 05/14/2017   TRIG 248 (H) 05/14/2017        Objective    BP 106/69 (BP Location: Right Arm, Patient Position: Sitting, Cuff Size: Normal)   Pulse 97   Temp 98.2 F (36.8 C) (Oral)   Resp 16   Wt 194 lb (88 kg)  SpO2 97%   BMI 30.38 kg/m  BP Readings from Last 3 Encounters:  05/04/20 106/69  03/21/20 (!) 124/58  12/15/19 110/67   Wt Readings from Last 3 Encounters:  05/04/20 194 lb (88 kg)  03/21/20 191 lb (86.6 kg)  12/15/19 193 lb 9.6 oz (87.8 kg)      Physical Exam Vitals reviewed.  Constitutional:      Appearance: He is well-developed.  HENT:     Head: Normocephalic and atraumatic.     Right Ear: External ear normal.     Left Ear: External ear normal.     Nose: Nose normal.  Eyes:     General: No scleral icterus. Neck:     Thyroid: No thyromegaly.  Cardiovascular:     Rate and Rhythm: Normal rate and regular rhythm.     Heart sounds: Normal heart sounds.  Pulmonary:     Effort: Pulmonary effort is normal.     Breath sounds: Wheezing present.     Comments: Some mild  wheezes and crackles in the right upper lobe. Abdominal:     Palpations: Abdomen is soft.  Skin:    General: Skin is warm and dry.  Neurological:     General: No focal deficit present.     Mental Status: He is alert.  Psychiatric:        Mood and Affect: Mood normal.       No results found for any visits on 05/04/20.  Assessment & Plan     1. Late onset Alzheimer's dementia with behavioral disturbance [facility]) This is progressive and drives some of the decision making with this including is 87 years of age. He is unable to understand what was going on so we will obtain chest x-ray treat as pneumonia the son is in agreement that aggressive work-up is not appropriate.  2. Mass of upper lobe of right lung Known mass.  Have contacted Dr. Grayland Ormond from oncology and he agrees with our approach with this. Chest x-ray just to know extent of changes and other possible etiologies for the wheezing. More than 50% 25 minute visit spent in counseling or coordination of care  - DG Chest 2 View  3. Wheezing O2 sat 97%.  No inhalers at this point as I am not sure patient could use them.  He is in no acute distress. - DG Chest 2 View  4. Pneumonia of right upper lobe due to infectious organism Z-Pak given  - DG Chest 2 View   No follow-ups on file.      I, Wilhemena Durie, MD, have reviewed all documentation for this visit. The documentation on 05/11/20 for the exam, diagnosis, procedures, and orders are all accurate and complete.    Cassadee Vanzandt Cranford Mon, MD  [facility] [phone] (phone) [phone] (fax)  [place]

## 2020-05-06 ENCOUNTER — Telehealth: Payer: Self-pay

## 2020-05-06 NOTE — Telephone Encounter (Signed)
Pt's wife given results per notes of Dr. Rosanna Randy on 05/05/20. Pt verbalized understanding.   Richard Maceo Pro., MD  05/05/2020 11:31 AM EST      Known lung masses present on chest x-ray. No new findings today. Plan as we discussed in office yesterday.

## 2020-05-18 ENCOUNTER — Ambulatory Visit: Payer: Self-pay | Admitting: Family Medicine

## 2020-05-23 ENCOUNTER — Ambulatory Visit: Payer: Medicare HMO | Admitting: Podiatry

## 2020-05-23 ENCOUNTER — Other Ambulatory Visit: Payer: Self-pay

## 2020-05-23 ENCOUNTER — Encounter: Payer: Self-pay | Admitting: Podiatry

## 2020-05-23 DIAGNOSIS — M79674 Pain in right toe(s): Secondary | ICD-10-CM

## 2020-05-23 DIAGNOSIS — B351 Tinea unguium: Secondary | ICD-10-CM | POA: Diagnosis not present

## 2020-05-23 DIAGNOSIS — M79675 Pain in left toe(s): Secondary | ICD-10-CM | POA: Diagnosis not present

## 2020-05-23 DIAGNOSIS — E119 Type 2 diabetes mellitus without complications: Secondary | ICD-10-CM | POA: Diagnosis not present

## 2020-05-23 NOTE — Progress Notes (Signed)
This patient returns to my office for at risk foot care.  This patient requires this care by a professional since this patient will be at risk due to having diabetic neuropathy.  He presents to the office with his son..   Patient was referred to this office for nail care.  This patient is unable to cut nails himself since the patient cannot reach his nails.These nails are painful walking and wearing shoes.  This patient presents for at risk foot care today.  General Appearance  Alert, conversant and in no acute stress.  Vascular  Dorsalis pedis and posterior tibial  pulses are weakly  palpable  bilaterally.  Capillary return is within normal limits  bilaterally. Temperature is within normal limits  bilaterally.  Neurologic  Deferred due to Alxheimer disease  Nails Thick disfigured discolored nails with subungual debris  from hallux to fifth toes bilaterally. No evidence of bacterial infection or drainage bilaterally.  Orthopedic  No limitations of motion  feet .  No crepitus or effusions noted.  No bony pathology or digital deformities noted.  Skin  normotropic skin with no porokeratosis noted bilaterally.  No signs of infections or ulcers noted.   Necrotic tissue 4th interspace right foot.  Onychomycosis  Pain in right toes  Pain in left toes  Consent was obtained for treatment procedures.   Mechanical debridement of nails 1-5  bilaterally performed with a nail nipper.  Filed with dremel without incident.    Return office visit   3 months                   Told patient to return for periodic foot care and evaluation due to potential at risk complications.   Gardiner Barefoot DPM

## 2020-05-24 ENCOUNTER — Telehealth: Payer: Self-pay

## 2020-05-24 NOTE — Telephone Encounter (Signed)
Volunteer called patient on behalf of Palliative Care and did not get a answer from patient/family. ° °

## 2020-05-25 ENCOUNTER — Ambulatory Visit (INDEPENDENT_AMBULATORY_CARE_PROVIDER_SITE_OTHER): Payer: Medicare HMO | Admitting: Family Medicine

## 2020-05-25 ENCOUNTER — Encounter: Payer: Self-pay | Admitting: Family Medicine

## 2020-05-25 ENCOUNTER — Other Ambulatory Visit: Payer: Self-pay

## 2020-05-25 VITALS — BP 105/72 | HR 53 | Temp 98.6°F | Resp 20 | Wt 192.6 lb

## 2020-05-25 DIAGNOSIS — F0281 Dementia in other diseases classified elsewhere with behavioral disturbance: Secondary | ICD-10-CM | POA: Diagnosis not present

## 2020-05-25 DIAGNOSIS — E119 Type 2 diabetes mellitus without complications: Secondary | ICD-10-CM | POA: Diagnosis not present

## 2020-05-25 DIAGNOSIS — R918 Other nonspecific abnormal finding of lung field: Secondary | ICD-10-CM | POA: Diagnosis not present

## 2020-05-25 DIAGNOSIS — G301 Alzheimer's disease with late onset: Secondary | ICD-10-CM | POA: Diagnosis not present

## 2020-05-25 NOTE — Progress Notes (Signed)
Established patient visit   Patient: Joel Grant   DOB: April 29, 1926   87 y.o. Male  MRN: [id number] Visit Date: 05/25/2020  Today's healthcare provider: Wilhemena Durie, MD   Chief Complaint  Patient presents with   Follow-up   Subjective    HPI  Patient continues to have the sounds that his son and wife can hear.  He has no breathing difficulties and no obvious shortness of breath.  No weight loss or hemoptysis.  No fevers. Mass of upper lobe of right lung From 05/04/2020- Known mass.  Have contacted Dr. Grayland Ormond from oncology and he agrees with our approach with this. Chest x-ray just to know extent of changes and other possible etiologies for the wheezing. CXR was stable.   Pneumonia of right upper lobe due to infectious organism From 05/04/2020- Z-Pak given        Medications: Outpatient Medications Prior to Visit  Medication Sig   [employer] LANCETS ULTRA-THIN 30G MISC USE 1 LANCET TO CHECK SUGAR TWICE A DAY   glucose blood (ACCU-CHEK AVIVA) test strip 1 each by Other route daily. Use as instructed   metFORMIN (GLUCOPHAGE) 1000 MG tablet TAKE 1 TABLET (1,000 MG TOTAL) BY MOUTH DAILY AFTER SUPPER.   azithromycin (ZITHROMAX) 250 MG tablet UAD   No facility-administered medications prior to visit.    Review of Systems      Objective    BP 105/72    Pulse (!) 53    Temp 98.6 F (37 C)    Resp 20    Wt 192 lb 9.6 oz (87.4 kg)    BMI 30.17 kg/m  SpO2 Readings from Last 3 Encounters:  05/04/20 97%  11/18/19 97%  01/15/19 98%       Physical Exam Vitals reviewed.  Constitutional:      Appearance: He is well-developed.  HENT:     Head: Normocephalic and atraumatic.     Right Ear: External ear normal.     Left Ear: External ear normal.     Nose: Nose normal.  Eyes:     General: No scleral icterus. Neck:     Thyroid: No thyromegaly.  Cardiovascular:     Rate and Rhythm: Normal rate and regular rhythm.     Heart sounds: Normal heart sounds.  Pulmonary:      Effort: Pulmonary effort is normal.     Breath sounds: Wheezing present.     Comments: Some mild wheezes and crackles in the right upper lobe. Abdominal:     Palpations: Abdomen is soft.  Skin:    General: Skin is warm and dry.  Neurological:     General: No focal deficit present.     Mental Status: He is alert.  Psychiatric:        Mood and Affect: Mood normal.       No results found for any visits on 05/25/20.  Assessment & Plan     1. Late onset Alzheimer's dementia with behavioral disturbance ([place]) Family is functioning with patient at home.  2. Mass of upper lobe of right lung Certainly progressive.  Again discussed with patient and family that they wished no intervention. Keep patient comfortable.  Probably have to involve hospice later on in the year.  Follow-up 2 months. Patient given prescription for ipratropium/albuterol nebulizer 3. Type 2 diabetes mellitus without complication, without long-term current use of insulin ([place]) Medically stable   No follow-ups on file.      I, Miguel Aschoff  Brooke Bonito, MD, have reviewed all documentation for this visit. The documentation on 05/29/20 for the exam, diagnosis, procedures, and orders are all accurate and complete.    Ohana Birdwell Cranford Mon, MD  [facility] [phone] (phone) [phone] (fax)  [place]

## 2020-06-28 NOTE — Progress Notes (Signed)
This encounter was created in error - please disregard.

## 2020-07-20 ENCOUNTER — Ambulatory Visit: Payer: Self-pay | Admitting: Family Medicine

## 2020-07-31 ENCOUNTER — Emergency Department: Payer: Medicare HMO

## 2020-07-31 ENCOUNTER — Inpatient Hospital Stay
Admission: EM | Admit: 2020-07-31 | Discharge: 2020-08-31 | DRG: 871 | Disposition: E | Payer: Medicare HMO | Attending: Internal Medicine | Admitting: Internal Medicine

## 2020-07-31 ENCOUNTER — Inpatient Hospital Stay: Payer: Medicare HMO

## 2020-07-31 ENCOUNTER — Other Ambulatory Visit: Payer: Self-pay

## 2020-07-31 DIAGNOSIS — E870 Hyperosmolality and hypernatremia: Secondary | ICD-10-CM | POA: Diagnosis not present

## 2020-07-31 DIAGNOSIS — J690 Pneumonitis due to inhalation of food and vomit: Secondary | ICD-10-CM | POA: Diagnosis present

## 2020-07-31 DIAGNOSIS — Z20822 Contact with and (suspected) exposure to covid-19: Secondary | ICD-10-CM | POA: Diagnosis not present

## 2020-07-31 DIAGNOSIS — E860 Dehydration: Secondary | ICD-10-CM | POA: Diagnosis not present

## 2020-07-31 DIAGNOSIS — E039 Hypothyroidism, unspecified: Secondary | ICD-10-CM | POA: Diagnosis not present

## 2020-07-31 DIAGNOSIS — F0390 Unspecified dementia without behavioral disturbance: Secondary | ICD-10-CM | POA: Diagnosis present

## 2020-07-31 DIAGNOSIS — I452 Bifascicular block: Secondary | ICD-10-CM | POA: Diagnosis present

## 2020-07-31 DIAGNOSIS — Z515 Encounter for palliative care: Secondary | ICD-10-CM | POA: Diagnosis not present

## 2020-07-31 DIAGNOSIS — C3411 Malignant neoplasm of upper lobe, right bronchus or lung: Secondary | ICD-10-CM | POA: Diagnosis present

## 2020-07-31 DIAGNOSIS — J189 Pneumonia, unspecified organism: Secondary | ICD-10-CM | POA: Diagnosis not present

## 2020-07-31 DIAGNOSIS — R4182 Altered mental status, unspecified: Secondary | ICD-10-CM

## 2020-07-31 DIAGNOSIS — R000 Tachycardia, unspecified: Secondary | ICD-10-CM | POA: Diagnosis not present

## 2020-07-31 DIAGNOSIS — Z7984 Long term (current) use of oral hypoglycemic drugs: Secondary | ICD-10-CM

## 2020-07-31 DIAGNOSIS — G9341 Metabolic encephalopathy: Secondary | ICD-10-CM | POA: Diagnosis not present

## 2020-07-31 DIAGNOSIS — Z7189 Other specified counseling: Secondary | ICD-10-CM | POA: Diagnosis not present

## 2020-07-31 DIAGNOSIS — N179 Acute kidney failure, unspecified: Secondary | ICD-10-CM | POA: Diagnosis present

## 2020-07-31 DIAGNOSIS — N1832 Chronic kidney disease, stage 3b: Secondary | ICD-10-CM | POA: Diagnosis present

## 2020-07-31 DIAGNOSIS — R918 Other nonspecific abnormal finding of lung field: Secondary | ICD-10-CM | POA: Diagnosis not present

## 2020-07-31 DIAGNOSIS — R6520 Severe sepsis without septic shock: Secondary | ICD-10-CM | POA: Diagnosis present

## 2020-07-31 DIAGNOSIS — Z66 Do not resuscitate: Secondary | ICD-10-CM | POA: Diagnosis not present

## 2020-07-31 DIAGNOSIS — E872 Acidosis: Secondary | ICD-10-CM | POA: Diagnosis present

## 2020-07-31 DIAGNOSIS — R404 Transient alteration of awareness: Secondary | ICD-10-CM | POA: Diagnosis not present

## 2020-07-31 DIAGNOSIS — A419 Sepsis, unspecified organism: Principal | ICD-10-CM | POA: Diagnosis present

## 2020-07-31 DIAGNOSIS — Z923 Personal history of irradiation: Secondary | ICD-10-CM

## 2020-07-31 DIAGNOSIS — R7401 Elevation of levels of liver transaminase levels: Secondary | ICD-10-CM | POA: Diagnosis not present

## 2020-07-31 DIAGNOSIS — I509 Heart failure, unspecified: Secondary | ICD-10-CM

## 2020-07-31 DIAGNOSIS — Z87891 Personal history of nicotine dependence: Secondary | ICD-10-CM

## 2020-07-31 DIAGNOSIS — E1165 Type 2 diabetes mellitus with hyperglycemia: Secondary | ICD-10-CM | POA: Diagnosis not present

## 2020-07-31 DIAGNOSIS — R739 Hyperglycemia, unspecified: Secondary | ICD-10-CM

## 2020-07-31 DIAGNOSIS — Z882 Allergy status to sulfonamides status: Secondary | ICD-10-CM

## 2020-07-31 DIAGNOSIS — Z833 Family history of diabetes mellitus: Secondary | ICD-10-CM

## 2020-07-31 DIAGNOSIS — G9340 Encephalopathy, unspecified: Secondary | ICD-10-CM | POA: Diagnosis not present

## 2020-07-31 DIAGNOSIS — Z885 Allergy status to narcotic agent status: Secondary | ICD-10-CM | POA: Diagnosis not present

## 2020-07-31 DIAGNOSIS — R0902 Hypoxemia: Secondary | ICD-10-CM | POA: Diagnosis not present

## 2020-07-31 DIAGNOSIS — E1122 Type 2 diabetes mellitus with diabetic chronic kidney disease: Secondary | ICD-10-CM | POA: Diagnosis present

## 2020-07-31 DIAGNOSIS — Z880 Allergy status to penicillin: Secondary | ICD-10-CM

## 2020-07-31 DIAGNOSIS — Z8249 Family history of ischemic heart disease and other diseases of the circulatory system: Secondary | ICD-10-CM

## 2020-07-31 DIAGNOSIS — R0689 Other abnormalities of breathing: Secondary | ICD-10-CM | POA: Diagnosis not present

## 2020-07-31 DIAGNOSIS — J9811 Atelectasis: Secondary | ICD-10-CM | POA: Diagnosis not present

## 2020-07-31 DIAGNOSIS — K760 Fatty (change of) liver, not elsewhere classified: Secondary | ICD-10-CM | POA: Diagnosis not present

## 2020-07-31 LAB — COMPREHENSIVE METABOLIC PANEL
ALT: 93 U/L — ABNORMAL HIGH (ref 0–44)
AST: 141 U/L — ABNORMAL HIGH (ref 15–41)
Albumin: 3.6 g/dL (ref 3.5–5.0)
Alkaline Phosphatase: 73 U/L (ref 38–126)
Anion gap: 13 (ref 5–15)
BUN: 57 mg/dL — ABNORMAL HIGH (ref 8–23)
CO2: 23 mmol/L (ref 22–32)
Calcium: 9.5 mg/dL (ref 8.9–10.3)
Chloride: 117 mmol/L — ABNORMAL HIGH (ref 98–111)
Creatinine, Ser: 2.69 mg/dL — ABNORMAL HIGH (ref 0.61–1.24)
GFR, Estimated: 22 mL/min — ABNORMAL LOW (ref >60)
Glucose, Bld: 682 mg/dL (ref 70–99)
Potassium: 4.2 mmol/L (ref 3.5–5.1)
Sodium: 153 mmol/L — ABNORMAL HIGH (ref 135–145)
Total Bilirubin: 0.8 mg/dL (ref 0.3–1.2)
Total Protein: 7.7 g/dL (ref 6.5–8.1)

## 2020-07-31 LAB — BLOOD GAS, VENOUS
Acid-Base Excess: 1 mmol/L (ref 0.0–2.0)
Bicarbonate: 26.6 mmol/L (ref 20.0–28.0)
O2 Saturation: 64 %
Patient temperature: 37
pCO2, Ven: 45 mmHg (ref 44.0–60.0)
pH, Ven: 7.38 (ref 7.250–7.430)
pO2, Ven: 34 mmHg (ref 32.0–45.0)

## 2020-07-31 LAB — URINALYSIS, COMPLETE (UACMP) WITH MICROSCOPIC
Bacteria, UA: NONE SEEN
Bilirubin Urine: NEGATIVE
Glucose, UA: >=500 mg/dL — AB
Ketones, ur: NEGATIVE mg/dL
Leukocytes,Ua: NEGATIVE
Nitrite: NEGATIVE
Protein, ur: 100 mg/dL — AB
Specific Gravity, Urine: 1.021 (ref 1.005–1.030)
pH: 5 (ref 5.0–8.0)

## 2020-07-31 LAB — HEMOGLOBIN A1C
Hgb A1c MFr Bld: 10.5 % — ABNORMAL HIGH (ref 4.8–5.6)
Mean Plasma Glucose: 254.65 mg/dL

## 2020-07-31 LAB — CBC WITH DIFFERENTIAL/PLATELET
Abs Immature Granulocytes: 0.17 10*3/uL — ABNORMAL HIGH (ref 0.00–0.07)
Basophils Absolute: 0.1 10*3/uL (ref 0.0–0.1)
Basophils Relative: 0 %
Eosinophils Absolute: 0 10*3/uL (ref 0.0–0.5)
Eosinophils Relative: 0 %
HCT: 44 % (ref 39.0–52.0)
Hemoglobin: 13.6 g/dL (ref 13.0–17.0)
Immature Granulocytes: 1 %
Lymphocytes Relative: 9 %
Lymphs Abs: 1.7 10*3/uL (ref 0.7–4.0)
MCH: 27.4 pg (ref 26.0–34.0)
MCHC: 30.9 g/dL (ref 30.0–36.0)
MCV: 88.7 fL (ref 80.0–100.0)
Monocytes Absolute: 1.4 10*3/uL — ABNORMAL HIGH (ref 0.1–1.0)
Monocytes Relative: 7 %
Neutro Abs: 16.3 10*3/uL — ABNORMAL HIGH (ref 1.7–7.7)
Neutrophils Relative %: 83 %
Platelets: 324 10*3/uL (ref 150–400)
RBC: 4.96 MIL/uL (ref 4.22–5.81)
RDW: 15.4 % (ref 11.5–15.5)
WBC: 19.6 10*3/uL — ABNORMAL HIGH (ref 4.0–10.5)
nRBC: 0 % (ref 0.0–0.2)

## 2020-07-31 LAB — RESP PANEL BY RT-PCR (FLU A&B, COVID) ARPGX2
Influenza A by PCR: NEGATIVE
Influenza B by PCR: NEGATIVE
SARS Coronavirus 2 by RT PCR: NEGATIVE

## 2020-07-31 LAB — GLUCOSE, CAPILLARY
Glucose-Capillary: 517 mg/dL (ref 70–99)
Glucose-Capillary: 468 mg/dL — ABNORMAL HIGH (ref 70–99)
Glucose-Capillary: 506 mg/dL (ref 70–99)
Glucose-Capillary: 396 mg/dL — ABNORMAL HIGH (ref 70–99)

## 2020-07-31 LAB — LACTIC ACID, PLASMA
Lactic Acid, Venous: 3.6 mmol/L (ref 0.5–1.9)
Lactic Acid, Venous: 2.9 mmol/L (ref 0.5–1.9)

## 2020-07-31 LAB — PROTIME-INR
INR: 1.3 — ABNORMAL HIGH (ref 0.8–1.2)
Prothrombin Time: 16.4 seconds — ABNORMAL HIGH (ref 11.4–15.2)

## 2020-07-31 LAB — APTT: aPTT: 25 seconds (ref 24–36)

## 2020-07-31 MED ORDER — INSULIN ASPART 100 UNIT/ML IJ SOLN
0.0000 [IU] | INTRAMUSCULAR | Status: DC
  Administered 2020-07-31: 15 [IU] via SUBCUTANEOUS
  Filled 2020-07-31: qty 1

## 2020-07-31 MED ORDER — METRONIDAZOLE 500 MG/100ML IV SOLN
500.0000 mg | Freq: Once | INTRAVENOUS | Status: AC
  Administered 2020-07-31: 500 mg via INTRAVENOUS
  Filled 2020-07-31: qty 100

## 2020-07-31 MED ORDER — ACETAMINOPHEN 650 MG RE SUPP: 650.0000 mg | Freq: Four times a day (QID) | RECTAL | Status: DC | PRN

## 2020-07-31 MED ORDER — SODIUM CHLORIDE 0.9 % IV SOLN
2.0000 g | Freq: Once | INTRAVENOUS | Status: AC
  Administered 2020-07-31: 2 g via INTRAVENOUS
  Filled 2020-07-31: qty 2

## 2020-07-31 MED ORDER — VANCOMYCIN HCL IN DEXTROSE 1-5 GM/200ML-% IV SOLN
1000.0000 mg | Freq: Once | INTRAVENOUS | Status: AC
  Administered 2020-07-31: 1000 mg via INTRAVENOUS
  Filled 2020-07-31: qty 200

## 2020-07-31 MED ORDER — SODIUM CHLORIDE 0.9 % IV SOLN: INTRAVENOUS | Status: DC

## 2020-07-31 MED ORDER — SODIUM CHLORIDE 0.9 % IV SOLN: 500.0000 mg | INTRAVENOUS | Status: DC

## 2020-07-31 MED ORDER — ONDANSETRON HCL 4 MG/2ML IJ SOLN: 4.0000 mg | Freq: Four times a day (QID) | INTRAMUSCULAR | Status: DC | PRN

## 2020-07-31 MED ORDER — SODIUM CHLORIDE 0.9 % IV SOLN
2.0000 g | INTRAVENOUS | Status: DC
  Administered 2020-08-01 – 2020-08-04 (×4): 2 g via INTRAVENOUS
  Filled 2020-07-31 (×2): qty 20
  Filled 2020-07-31 (×2): qty 2
  Filled 2020-07-31 (×2): qty 20

## 2020-07-31 MED ORDER — ENOXAPARIN SODIUM 30 MG/0.3ML IJ SOSY
30.0000 mg | PREFILLED_SYRINGE | INTRAMUSCULAR | Status: DC
  Administered 2020-07-31 – 2020-08-02 (×3): 30 mg via SUBCUTANEOUS
  Filled 2020-07-31 (×4): qty 0.3

## 2020-07-31 MED ORDER — SODIUM CHLORIDE 0.9 % IV SOLN
1000.0000 mL | Freq: Once | INTRAVENOUS | Status: AC
  Administered 2020-07-31: 1000 mL via INTRAVENOUS

## 2020-07-31 MED ORDER — SODIUM CHLORIDE 0.9 % IV SOLN: 2.0000 g | INTRAVENOUS | Status: DC

## 2020-07-31 MED ORDER — ONDANSETRON HCL 4 MG PO TABS: 4.0000 mg | ORAL_TABLET | Freq: Four times a day (QID) | ORAL | Status: DC | PRN

## 2020-07-31 MED ORDER — INSULIN ASPART 100 UNIT/ML IJ SOLN
20.0000 [IU] | Freq: Once | INTRAMUSCULAR | Status: AC
  Administered 2020-07-31: 20 [IU] via SUBCUTANEOUS
  Filled 2020-07-31: qty 1

## 2020-07-31 MED ORDER — LACTATED RINGERS IV SOLN: INTRAVENOUS | Status: DC

## 2020-07-31 MED ORDER — ACETAMINOPHEN 325 MG PO TABS: 650.0000 mg | ORAL_TABLET | Freq: Four times a day (QID) | ORAL | Status: DC | PRN

## 2020-07-31 MED ORDER — SODIUM CHLORIDE 0.9 % IV BOLUS (SEPSIS)
1000.0000 mL | Freq: Once | INTRAVENOUS | Status: AC
  Administered 2020-07-31: 1000 mL via INTRAVENOUS

## 2020-07-31 NOTE — ED Notes (Signed)
Staff at bedside with patient.

## 2020-07-31 NOTE — H&P (Signed)
History and Physical    Joel Grant:[id number] DOB: 1926/12/25 DOA: 08/15/2020  PCP: Jerrol Banana., MD   Patient coming from: Home  I have personally briefly reviewed patient's old medical records in [place]  Chief Complaint: Change in mental status  Most of the history was obtained from patient's son over the phone.  HPI: Joel Grant is a 87 y.o. male with medical history significant for dementia, diabetes mellitus, lung cancer who was brought into the ER by EMS for evaluation of mental status changes.  Patient's son states that he was in his usual state of health until Friday when they noticed that he was less verbal and responsive.  He states the patient has a history of dementia but is usually able to answer simple questions and carry on conversation.  He has not been able to do that in the last 2 days.  They also noted that he had become increasingly weak and started needing a walker for ambulation given that at baseline he ambulates without an assist device.  On the day of admission they noticed that he was nonverbal and unable to follow any commands and so they called EMS. When EMS arrived patient was found to have a blood sugar of 596 g/dl. He was transported to the ER for further evaluation. I am unable to do a review of systems on this patient. Labs show sodium 153, potassium 4.2, chloride 117, bicarb 23, serum glucose 682, BUN 57, creatinine 2.69 above a baseline of 1.6, calcium 9.5, alkaline phosphatase 73, albumin 3.6, AST 141, ALT 93, total protein 7.7, lactic acid 3.6, white count 19.6, hemoglobin 13.6, hematocrit 44.0, MCV 88.7, RDW 15.4, platelet count 324, PT 16.4, INR 1.3, Chest x-ray reviewed by me shows increased right upper lobe airspace disease likely reflecting combination of posttreatment changes and malignancy. Superimposed pneumonia cannot be excluded. CT scan of the head without contrast no acute intracranial abnormality. Chronic/nonemergent  findings detailed above. Twelve-lead EKG reviewed by me shows sinus tachycardia with PVCs, left anterior fascicular block and right bundle branch block    ED Course: Patient is a 87 year old Caucasian male with a history of dementia, lung cancer and diabetes mellitus who presents to the ER via EMS for evaluation of mental status changes.  Patient was noted to have hyperglycemia in the field.  He is tachycardic, tachypneic, has marked leukocytosis and lactate level of 3.6.  He also has an infiltrate in the right upper lobe suggestive of possible pneumonia.  Patient is altered and unable to participate in this history and physical which is a change from his baseline.  He also has hypernatremia as well as worsening of his renal function from baseline, 1.60 >>2.69.  He received broad-spectrum antibiotic therapy with cefepime, vancomycin and Flagyl and will be admitted to the hospital for further evaluation.    Review of Systems: As per HPI otherwise all other systems on review of systems negative.    Past Medical History:  Diagnosis Date  . 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 Dementia ([place])   . Diabetes mellitus without complication ([place])   . Dyspnea   . Pneumonia   . Psoriasis   . Tuberculosis    per wife as a child    Past Surgical History:  Procedure Laterality Date  . CATARACT EXTRACTION       reports that he quit smoking about 44 years ago. He has never used  smokeless tobacco. He reports that he does not drink alcohol and does not use drugs.  Allergies  Allergen Reactions  . Ciprofloxacin Anaphylaxis  . Hydrocodone-Acetaminophen Nausea And Vomiting  . Penicillins     rash  . Sulfa Antibiotics     Family History  Problem Relation Age of Onset  . Heart disease Mother   . Dementia Mother   . Diabetes Father   . Heart disease Father   . Peripheral vascular disease Brother   . Diabetes Brother       Prior to Admission  medications   Medication Sig Start Date End Date Taking? Authorizing Provider  azithromycin (ZITHROMAX) 250 MG tablet UAD 05/04/20   Jerrol Banana., MD  [employer]S LANCETS ULTRA-THIN 30G MISC USE 1 LANCET TO CHECK SUGAR TWICE A DAY 06/04/16   Jerrol Banana., MD  glucose blood (ACCU-CHEK AVIVA) test strip 1 each by Other route daily. Use as instructed 03/20/18   Jerrol Banana., MD  metFORMIN (GLUCOPHAGE) 1000 MG tablet TAKE 1 TABLET (1,000 MG TOTAL) BY MOUTH DAILY AFTER SUPPER. 04/01/20   Jerrol Banana., MD    Physical Exam: Vitals:   08/07/2020 1240 08/17/2020 1252 08/10/2020 1416 08/19/2020 1430  BP:  (!) 146/72 (!) 127/95   Pulse:   98   Resp:   (!) 22   Temp:    (!) 100.5 F (38.1 C)  TempSrc:    Axillary  SpO2:   93%   Weight: 99.8 kg     Height: 5\' 7"  (1.702 m)        Vitals:   08/29/2020 1240 08/25/2020 1252 08/27/2020 1416 08/06/2020 1430  BP:  (!) 146/72 (!) 127/95   Pulse:   98   Resp:   (!) 22   Temp:    (!) 100.5 F (38.1 C)  TempSrc:    Axillary  SpO2:   93%   Weight: 99.8 kg     Height: 5\' 7"  (1.702 m)         Constitutional:  Lethargic.  Unable to follow any commands.  Moving his extremities spontaneously. HEENT:      Head: Normocephalic and atraumatic.         Eyes: PERLA, EOMI, Conjunctivae are normal. Sclera is non-icteric.       Mouth/Throat: Mucous membranes are dry.       Neck: Supple with no signs of meningismus. Cardiovascular:  Tachycardic. No murmurs, gallops, or rubs. 2+ symmetrical distal pulses are present . No JVD. No  LE edema Respiratory:  Tachypneic.bilateral air entry with rhonchi in the right upper lung fields. No wheezes or crackles Gastrointestinal: Soft, non tender, and non distended with positive bowel sounds.  Genitourinary: No CVA tenderness. Musculoskeletal: Nontender with normal range of motion in all extremities. No cyanosis, or erythema of extremities. Neurologic:  Face is symmetric. Moving all extremities  spontaneously. Skin: Skin is warm, dry.  No rash or ulcers Psychiatric: Unable to assess   Labs on Admission: I have personally reviewed following labs and imaging studies  CBC: Recent Labs  Lab 08/24/2020 1245  WBC 19.6*  NEUTROABS 16.3*  HGB 13.6  HCT 44.0  MCV 88.7  PLT 161   Basic Metabolic Panel: Recent Labs  Lab 08/06/2020 1245  NA 153*  K 4.2  CL 117*  CO2 23  GLUCOSE 682*  BUN 57*  CREATININE 2.69*  CALCIUM 9.5   GFR: Estimated Creatinine Clearance: 20.1 mL/min (A) (by C-G formula based on SCr of  2.69 mg/dL (H)). Liver Function Tests: Recent Labs  Lab 08/07/2020 1245  AST 141*  ALT 93*  ALKPHOS 73  BILITOT 0.8  PROT 7.7  ALBUMIN 3.6   No results for input(s): LIPASE, AMYLASE in the last 168 hours. No results for input(s): AMMONIA in the last 168 hours. Coagulation Profile: Recent Labs  Lab 08/20/2020 1245  INR 1.3*   Cardiac Enzymes: No results for input(s): CKTOTAL, CKMB, CKMBINDEX, TROPONINI in the last 168 hours. BNP (last 3 results) No results for input(s): PROBNP in the last 8760 hours. HbA1C: No results for input(s): HGBA1C in the last 72 hours. CBG: No results for input(s): GLUCAP in the last 168 hours. Lipid Profile: No results for input(s): CHOL, HDL, LDLCALC, TRIG, CHOLHDL, LDLDIRECT in the last 72 hours. Thyroid Function Tests: No results for input(s): TSH, T4TOTAL, FREET4, T3FREE, THYROIDAB in the last 72 hours. Anemia Panel: No results for input(s): VITAMINB12, FOLATE, FERRITIN, TIBC, IRON, RETICCTPCT in the last 72 hours. Urine analysis:    Component Value Date/Time   COLORURINE YELLOW (A) 03/17/2018 1659   APPEARANCEUR Clear 03/21/2020 1200   LABSPEC 1.020 03/17/2018 1659   PHURINE 5.0 03/17/2018 1659   GLUCOSEU 2+ (A) 03/21/2020 1200   HGBUR NEGATIVE 03/17/2018 1659   BILIRUBINUR Negative 03/21/2020 1200   KETONESUR NEGATIVE 03/17/2018 1659   PROTEINUR 1+ (A) 03/21/2020 1200   PROTEINUR 30 (A) 03/17/2018 1659    UROBILINOGEN 0.2 12/15/2019 1410   NITRITE Negative 03/21/2020 1200   NITRITE NEGATIVE 03/17/2018 1659   LEUKOCYTESUR Negative 03/21/2020 1200    Radiological Exams on Admission: CT Head Wo Contrast  Result Date: 08/28/2020 CLINICAL DATA:  Mental status change EXAM: CT HEAD WITHOUT CONTRAST TECHNIQUE: Contiguous axial images were obtained from the base of the skull through the vertex without intravenous contrast. COMPARISON:  December 2019 FINDINGS: Brain: There is no acute intracranial hemorrhage, mass effect, or edema. Gray-white differentiation is preserved. There is no extra-axial fluid collection. Prominence of the ventricles and sulci reflects advanced generalized parenchymal volume loss. Patchy hypoattenuation in the supratentorial white matter is nonspecific but probably reflects chronic microvascular ischemic changes. 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 Small chronic right cerebellar infarct. Vascular: There is atherosclerotic calcification at the skull base. Skull: Calvarium is unremarkable. Sinuses/Orbits: No acute finding. Other: None. IMPRESSION: No acute intracranial abnormality. Chronic/nonemergent findings detailed above. Electronically Signed   By: Macy Mis M.D.   On: 08/19/2020 13:34   DG Chest Port 1 View  Result Date: 08/29/2020 CLINICAL DATA:  Questionable sepsis. EXAM: PORTABLE CHEST 1 VIEW COMPARISON:  05/04/2020 FINDINGS: Increased right upper lobe airspace disease likely reflecting a combination of posttreatment changes and malignancy. Stable perihilar airspace disease. No definite pleural effusion. No pneumothorax. No other areas of consolidation. Stable heart size. No acute osseous abnormality peer IMPRESSION: Increased right upper lobe airspace disease likely reflecting combination of posttreatment changes and malignancy. Superimposed pneumonia cannot be excluded. Electronically Signed   By: Kathreen Devoid   On: 08/22/2020 13:54      Assessment/Plan Principal Problem:   Sepsis ([place]) Active Problems:   Adult hypothyroidism   Mass of upper lobe of right lung   Aspiration pneumonia (HCC)   Acute metabolic encephalopathy   Hypernatremia   Dehydration   Diabetes mellitus with hyperglycemia (HCC)   Transaminitis     Sepsis from possible aspiration pneumonia (POA) As evidenced by tachycardia, tachypnea, marked leukocytosis of 19K, lactic acidosis of 3.6 and infiltrate in the right upper lobe. Patient  has a qSOFA score of 2 Aggressive IV fluid resuscitation We will place patient empirically on IV antibiotic therapy with Rocephin Strict aspiration precautions Follow-up results of blood cultures    Acute metabolic encephalopathy Most likely secondary to dehydration, hyperglycemia, hyponatremia and sepsis from aspiration pneumonia. Patient has a history of dementia but at baseline is awake and alert. He is currently lethargic and unable to follow any commands Improvement in patient's mental status once his acute illness/issues resolve     Diabetes mellitus with hyperglycemia Hold metformin Aggressive IV fluid resuscitation Sliding scale coverage with insulin Accu-Cheks every 6 hours    Dehydration and hypernatremia Most likely secondary to poor oral intake Patient noted to have very dry mucous membranes At baseline he has a serum creatinine of 1.6 but on admission today his serum creatinine is 2.69 Aggressive IV fluid hydration Repeat electrolytes in a.m.    Transaminitis Unclear etiology Obtain right upper quadrant ultrasound    Mass of right upper lobe No biopsy was able to be performed but given the continued growth, this is highly suspicious for underlying malignancy and patient underwent palliative XRT completing in June 2019.   Patient had a CT scan of the chest done on 12/18/19 which showed continued growth of the mass but given patient's advanced age and worsening dementia, it was  agreed that no further imaging or follow-up was necessary.  DVT prophylaxis: Lovenox Code Status: DO NOT RESUSCITATE Family Communication: Greater than 50% of time was spent discussing patient's condition and plan of care with his son 9[name] Grandfield)  and wife over the phone.  All questions and concerns have been addressed.  They verbalized understanding and agree with the treatment plan.  CODE STATUS was discussed and patient is a DO NOT RESUSCITATE Disposition Plan: Back to previous home environment Consults called: none Status: At the time of admission, it appears that the appropriate admission status for this patient is inpatient. This is judged to be reasonable and necessary in order to provide the required intensity of service to ensure the patient's safety given the presenting symptoms, physical exam findings and initial radiographic and laboratory data in the context of their comorbid conditions. Patient requires inpatient status due to high intensity of service, high risk for further deterioration and high frequency of surveillance required.    Collier Bullock MD [facility]     08/15/2020, 2:50 PM

## 2020-07-31 NOTE — ED Notes (Signed)
Admitting provider at bedside, pt appears restless and is moving around in the bed and pulling at IV lines. Both IVs secured with coban. RN remains at bedside, admitting provider will order for a sitter.

## 2020-07-31 NOTE — Sepsis Progress Note (Signed)
elink is following code sepsis

## 2020-07-31 NOTE — ED Notes (Signed)
Pt taken for scans 

## 2020-07-31 NOTE — ED Triage Notes (Signed)
Pt arrives via EMS form home after being increasingly altered since Friday- pt is a DNR with lung cancer on palliative care- family is trying to get him switched to hospice- pt had CBG of 596 for EMS- pt normally a&O x4 bu tis nonverbal now

## 2020-07-31 NOTE — ED Notes (Signed)
Pt appears comfortable, resting quietly. Mumbles in response to verbal stimuli. Stretcher locked in low position with side rails up x2, mittens released to assess circulation, bilateral capillary refill less than 3 seconds in fingers. Will continue to monitor.

## 2020-07-31 NOTE — ED Provider Notes (Signed)
[facility] Emergency Department Provider Note   ____________________________________________    I have reviewed the triage vital signs and the nursing notes.   HISTORY  Chief Complaint Altered Mental Status  History is severely limited   HPI Joel Grant is a 87 y.o. male with a history of dementia brought in for evaluation due to decreased mental status.  Apparently at baseline patient is able to hold conversations.  Reportedly sent here for decreased responsiveness  Past Medical History:  Diagnosis Date  . 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 Dementia ([place])   . Diabetes mellitus without complication ([place])   . Dyspnea   . Pneumonia   . Psoriasis   . Tuberculosis    per wife as a child    Patient Active Problem List   Diagnosis Date Noted  . Pain due to onychomycosis of toenails of both feet 07/13/2019  . Rash and nonspecific skin eruption 01/14/2017  . Mass of upper lobe of right lung 02/05/2016  . Sepsis ([place]) 01/28/2016  . Adenomatous colon polyp 12/09/2014  . Alzheimer's dementia ([place]) 12/09/2014  . 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 Diabetes ([place]) 12/09/2014  . Glaucoma 12/09/2014  . Adult hypothyroidism 12/09/2014  . Adiposity 12/09/2014  . Arthritis, degenerative 12/09/2014  . Hypercholesterolemia without hypertriglyceridemia 12/09/2014  . Avitaminosis D 12/09/2014    Past Surgical History:  Procedure Laterality Date  . CATARACT EXTRACTION      Prior to Admission medications   Medication Sig Start Date End Date Taking? Authorizing Provider  azithromycin (ZITHROMAX) 250 MG tablet UAD 05/04/20   Jerrol Banana., MD  [employer] LANCETS ULTRA-THIN 30G MISC USE 1 LANCET TO CHECK SUGAR TWICE A DAY 06/04/16   Jerrol Banana., MD  glucose blood (ACCU-CHEK AVIVA) test strip 1 each by Other route daily. Use as instructed 03/20/18   Jerrol Banana., MD  metFORMIN  (GLUCOPHAGE) 1000 MG tablet TAKE 1 TABLET (1,000 MG TOTAL) BY MOUTH DAILY AFTER SUPPER. 04/01/20   Jerrol Banana., MD     Allergies Ciprofloxacin, Hydrocodone-acetaminophen, Penicillins, and Sulfa antibiotics  Family History  Problem Relation Age of Onset  . Heart disease Mother   . Dementia Mother   . Diabetes Father   . Heart disease Father   . Peripheral vascular disease Brother   . Diabetes Brother     Social History Social History   Tobacco Use  . Smoking status: Former Smoker    Quit date: 02/20/1976    Years since quitting: 44.4  . Smokeless tobacco: Never Used  Vaping Use  . Vaping Use: Never used  Substance Use Topics  . Alcohol use: No  . Drug use: No    Unable to obtain review of Systems     ____________________________________________   PHYSICAL EXAM:  VITAL SIGNS: ED Triage Vitals  Enc Vitals Group     BP 08/19/2020 1252 (!) 146/72     Pulse Rate 08/23/2020 1239 (!) 109     Resp 08/28/2020 1239 (!) 28     Temp --      Temp src --      SpO2 08/22/2020 1239 94 %     Weight 08/16/2020 1240 99.8 kg (220 lb)     Height 08/05/2020 1240 1.702 m (5\' 7" )     Head Circumference --      Peak Flow --      Pain  Score --      Pain Loc --      Pain Edu? --      Excl. in [place]? --     Constitutional: Alert, ill-appearing Eyes: Conjunctivae are normal.  Head: Atraumatic. Nose: No congestion/rhinnorhea. Mouth/Throat: Mucous membranes are dry Neck:  Painless ROM Cardiovascular: Tachycardia, regular rhythm. Grossly normal heart sounds.  Good peripheral circulation. Respiratory: Normal respiratory effort.  No retractions.  Bibasilar rales Gastrointestinal: Soft and nontender. No distention.  No CVA tenderness.  Musculoskeletal: No lower extremity tenderness nor edema.  Warm and well perfused Neurologic: Appears to move all extremities equally, no facial droop Skin:  Skin is warm, dry and intact. No rash  noted.   ____________________________________________   LABS (all labs ordered are listed, but only abnormal results are displayed)  Labs Reviewed  LACTIC ACID, PLASMA - Abnormal; Notable for the following components:      Result Value   Lactic Acid, Venous 3.6 (*)    All other components within normal limits  COMPREHENSIVE METABOLIC PANEL - Abnormal; Notable for the following components:   Sodium 153 (*)    Chloride 117 (*)    Glucose, Bld 682 (*)    BUN 57 (*)    Creatinine, Ser 2.69 (*)    AST 141 (*)    ALT 93 (*)    GFR, Estimated 22 (*)    All other components within normal limits  CBC WITH DIFFERENTIAL/PLATELET - Abnormal; Notable for the following components:   WBC 19.6 (*)    Neutro Abs 16.3 (*)    Monocytes Absolute 1.4 (*)    Abs Immature Granulocytes 0.17 (*)    All other components within normal limits  PROTIME-INR - Abnormal; Notable for the following components:   Prothrombin Time 16.4 (*)    INR 1.3 (*)    All other components within normal limits  URINE CULTURE  CULTURE, BLOOD (ROUTINE X 2)  CULTURE, BLOOD (ROUTINE X 2)  RESP PANEL BY RT-PCR (FLU A&B, COVID) ARPGX2  APTT  BLOOD GAS, VENOUS  LACTIC ACID, PLASMA  URINALYSIS, COMPLETE (UACMP) WITH MICROSCOPIC  HEMOGLOBIN A1C   ____________________________________________  EKG  ED ECG REPORT I, Lavonia Drafts, the attending physician, personally viewed and interpreted this ECG.  Date: 08/19/2020  Rhythm: Sinus tachycardia QRS Axis: normal Intervals: normal ST/T Wave abnormalities: normal Narrative Interpretation: no evidence of acute ischemia  ____________________________________________  RADIOLOGY  Chest x-ray viewed by me, no clear pneumonia ____________________________________________   PROCEDURES  Procedure(s) performed: No  Procedures   Critical Care performed: yes  CRITICAL CARE Performed by: Lavonia Drafts   Total critical care time: 30 minutes  Critical care time was  exclusive of separately billable procedures and treating other patients.  Critical care was necessary to treat or prevent imminent or life-threatening deterioration.  Critical care was time spent personally by me on the following activities: development of treatment plan with patient and/or surrogate as well as nursing, discussions with consultants, evaluation of patient's response to treatment, examination of patient, obtaining history from patient or surrogate, ordering and performing treatments and interventions, ordering and review of laboratory studies, ordering and review of radiographic studies, pulse oximetry and re-evaluation of patient's condition.  ____________________________________________   INITIAL IMPRESSION / ASSESSMENT AND PLAN / ED COURSE  Pertinent labs & imaging results that were available during my care of the patient were reviewed by me and considered in my medical decision making (see chart for details).  Patient presents with altered mental status.  Found  to be tachycardic here, oxygen saturations 93% on 2 L nasal cannula.  Suspicious for possible pneumonia, appears to be moving all extremities equally.  Will obtain labs, including lactic, blood cultures, urine  Lab work notable for elevated white blood cell count of 19,000, lactic acid is elevated, 2 L IV bolus given, broad-spectrum IV antibiotics ordered, cefepime and Vanco  Patient significantly hyperglycemic and hypernatremic as well, fluids infusing  I discussed with the hospitalist for admission    ____________________________________________   FINAL CLINICAL IMPRESSION(S) / ED DIAGNOSES  Final diagnoses:  Altered mental status, unspecified altered mental status type  Dehydration  Hyperglycemia  Sepsis, due to unspecified organism, unspecified whether acute organ dysfunction present [facility])        Note:  This document was prepared using Dragon voice recognition software and may include unintentional  dictation errors.   Lavonia Drafts, MD 08/30/2020 1435

## 2020-07-31 NOTE — ED Notes (Signed)
Dr Francine Graven, attending physician, notified of fingerstick blood glucose of 517. Order entered for lab draw glucose. Awaiting further orders.

## 2020-07-31 NOTE — Consult Note (Signed)
PHARMACY -  BRIEF ANTIBIOTIC NOTE   Pharmacy has received consult(s) for vancomycin and cefepime from an ED provider. Patient is also ordered metronidazole. The patient's profile has been reviewed for ht/wt/allergies/indication/available labs.    One time order(s) placed for  --Cefepime 2 g IV --Vancomycin 1 g IV  Further antibiotics/pharmacy consults should be ordered by admitting physician if indicated.                       Thank you, Benita Gutter 08/11/2020  1:33 PM

## 2020-07-31 NOTE — ED Notes (Signed)
Pt appears comfortable, no nonverbal indicators of pain present. Resting, respirations unlabored, tachypnic with RR of 28. Stretcher is in low position with wheels locked, side rails up x2. Mittens in use to prevent patient from pulling out IV lines. Will continue to monitor.

## 2020-07-31 NOTE — Progress Notes (Signed)
Patient admitted to room 241. Son and wife at bedside. VSS on 2L. CBG check upon admission 468, Dr.Agbata notified. Dr. Kerney Elbe would like to continue with Q4 CBG. Patient remains NPO.

## 2020-08-01 ENCOUNTER — Encounter: Payer: Self-pay | Admitting: Internal Medicine

## 2020-08-01 DIAGNOSIS — G9341 Metabolic encephalopathy: Secondary | ICD-10-CM | POA: Diagnosis not present

## 2020-08-01 DIAGNOSIS — R7401 Elevation of levels of liver transaminase levels: Secondary | ICD-10-CM

## 2020-08-01 DIAGNOSIS — A419 Sepsis, unspecified organism: Secondary | ICD-10-CM | POA: Diagnosis not present

## 2020-08-01 DIAGNOSIS — J690 Pneumonitis due to inhalation of food and vomit: Secondary | ICD-10-CM | POA: Diagnosis not present

## 2020-08-01 DIAGNOSIS — E860 Dehydration: Secondary | ICD-10-CM | POA: Diagnosis not present

## 2020-08-01 DIAGNOSIS — E870 Hyperosmolality and hypernatremia: Secondary | ICD-10-CM

## 2020-08-01 LAB — PROTIME-INR
INR: 1.3 — ABNORMAL HIGH (ref 0.8–1.2)
Prothrombin Time: 16.6 seconds — ABNORMAL HIGH (ref 11.4–15.2)

## 2020-08-01 LAB — COMPREHENSIVE METABOLIC PANEL
ALT: 97 U/L — ABNORMAL HIGH (ref 0–44)
AST: 140 U/L — ABNORMAL HIGH (ref 15–41)
Albumin: 2.9 g/dL — ABNORMAL LOW (ref 3.5–5.0)
Alkaline Phosphatase: 54 U/L (ref 38–126)
Anion gap: 9 (ref 5–15)
BUN: 54 mg/dL — ABNORMAL HIGH (ref 8–23)
CO2: 22 mmol/L (ref 22–32)
Calcium: 8.6 mg/dL — ABNORMAL LOW (ref 8.9–10.3)
Chloride: 129 mmol/L — ABNORMAL HIGH (ref 98–111)
Creatinine, Ser: 2.48 mg/dL — ABNORMAL HIGH (ref 0.61–1.24)
GFR, Estimated: 24 mL/min — ABNORMAL LOW (ref >60)
Glucose, Bld: 177 mg/dL — ABNORMAL HIGH (ref 70–99)
Potassium: 3.7 mmol/L (ref 3.5–5.1)
Sodium: 160 mmol/L — ABNORMAL HIGH (ref 135–145)
Total Bilirubin: 0.7 mg/dL (ref 0.3–1.2)
Total Protein: 6.4 g/dL — ABNORMAL LOW (ref 6.5–8.1)

## 2020-08-01 LAB — PROCALCITONIN: Procalcitonin: 0.43 ng/mL

## 2020-08-01 LAB — GLUCOSE, CAPILLARY
Glucose-Capillary: 150 mg/dL — ABNORMAL HIGH (ref 70–99)
Glucose-Capillary: 206 mg/dL — ABNORMAL HIGH (ref 70–99)
Glucose-Capillary: 250 mg/dL — ABNORMAL HIGH (ref 70–99)
Glucose-Capillary: 251 mg/dL — ABNORMAL HIGH (ref 70–99)
Glucose-Capillary: 259 mg/dL — ABNORMAL HIGH (ref 70–99)
Glucose-Capillary: 302 mg/dL — ABNORMAL HIGH (ref 70–99)

## 2020-08-01 LAB — CORTISOL-AM, BLOOD: Cortisol - AM: 25.5 ug/dL — ABNORMAL HIGH (ref 6.7–22.6)

## 2020-08-01 MED ORDER — SODIUM CHLORIDE 0.45 % IV SOLN: INTRAVENOUS | Status: DC

## 2020-08-01 NOTE — Progress Notes (Addendum)
Progress Note    Joel Grant  YPP:[id number] DOB: 1926/04/10  DOA: 08/07/2020 PCP: Jerrol Banana., MD      Brief Narrative:    Medical records reviewed and are as summarized below:  Joel Grant is a 87 y.o. male       Assessment/Plan:   Principal Problem:   Severe sepsis ([place]) Active Problems:   Adult hypothyroidism   Mass of upper lobe of right lung   Aspiration pneumonia ([place])   Acute metabolic encephalopathy   Hypernatremia   Dehydration   Diabetes mellitus with hyperglycemia ([place])   Transaminitis   Body mass index is 28.42 kg/m.    Severe sepsis secondary to pneumonia: Continue empiric IV antibiotics and IV fluids.  Follow-up blood cultures.  AKI on CKD stage IIIb, severe hypernatremia, dehydration: Change IV normal saline to IV half-normal saline.  Monitor BMP closely.  Acute metabolic encephalopathy with underlying dementia: Continue supportive care  Type II DM with severe hyperglycemia: Hemoglobin A1c is 10.5.  Glucose levels are trending down.  Avoiding insulin for now because of n.p.o. status.  Insulin will be considered if glucose levels show a significant upward trend.  Continue IV fluids for hyperglycemia.    Elevated liver enzymes: This is probably due to sepsis.  Right upper lobe mass: Suspicious for malignancy and underwent palliative radiation therapy in June 2019.  However, CT scan done in September 2021 showed continued growth of the mass.  No further work-up was pursued because of advanced age and dementia.  Plan of care discussed with patient's wife and son.  They are contemplating comfort measures with hospice but they want to see how he does by tomorrow.  Diet Order            Diet NPO time specified  Diet effective now                    Consultants:  None  Procedures:  None    Medications:   . enoxaparin (LOVENOX) injection  30 mg Subcutaneous Q24H   Continuous Infusions: . sodium chloride 125  mL/hr at 08/01/20 0821  . cefTRIAXone (ROCEPHIN)  IV 2 g (08/01/20 1130)     Anti-infectives (From admission, onward)   Start     Dose/Rate Route Frequency Ordered Stop   08/01/20 1000  cefTRIAXone (ROCEPHIN) 2 g in sodium chloride 0.9 % 100 mL IVPB        2 g 200 mL/hr over 30 Minutes Intravenous Every 24 hours 08/26/2020 1437     08/11/2020 1430  cefTRIAXone (ROCEPHIN) 2 g in sodium chloride 0.9 % 100 mL IVPB  Status:  Discontinued        2 g 200 mL/hr over 30 Minutes Intravenous Every 24 hours 08/12/2020 1418 08/19/2020 1420   08/30/2020 1430  azithromycin (ZITHROMAX) 500 mg in sodium chloride 0.9 % 250 mL IVPB  Status:  Discontinued        500 mg 250 mL/hr over 60 Minutes Intravenous Every 24 hours 08/15/2020 1418 08/30/2020 1420   08/02/2020 1330  ceFEPIme (MAXIPIME) 2 g in sodium chloride 0.9 % 100 mL IVPB        2 g 200 mL/hr over 30 Minutes Intravenous  Once 08/26/2020 1319 08/25/2020 1432   08/09/2020 1330  metroNIDAZOLE (FLAGYL) IVPB 500 mg        500 mg 100 mL/hr over 60 Minutes Intravenous  Once 08/12/2020 1319 08/14/2020 1542   08/25/2020 1330  vancomycin (VANCOCIN)  IVPB 1000 mg/200 mL premix        1,000 mg 200 mL/hr over 60 Minutes Intravenous  Once 08/16/2020 1319 08/17/2020 1504             Family Communication/Anticipated D/C date and plan/Code Status   DVT prophylaxis: enoxaparin (LOVENOX) injection 30 mg Start: 08/29/2020 2200     Code Status: DNR  Family Communication: His wife and son, [name] Disposition Plan:    Status is: Inpatient  Remains inpatient appropriate because:IV treatments appropriate due to intensity of illness or inability to take PO and Inpatient level of care appropriate due to severity of illness   Dispo: The patient is from: Home              Anticipated d/c is to: Hospice house              Patient currently is not medically stable to d/c.   Difficult to place patient No           Subjective:   Patient is lethargic and unable to provide any  history.  His wife and son were at the bedside.  Objective:    Vitals:   08/21/2020 2010 08/01/20 0352 08/01/20 0713 08/01/20 1143  BP: 122/70 121/60 (!) 129/54 (!) 150/65  Pulse: 74 87 81 77  Resp: 20 20 19 19   Temp: 98 F (36.7 C) 98.2 F (36.8 C) 100.1 F (37.8 C) 98.5 F (36.9 C)  TempSrc:  Axillary Oral Oral  SpO2: 99% 96% 100% 95%  Weight:  82.3 kg    Height:       No data found.   Intake/Output Summary (Last 24 hours) at 08/01/2020 1159 Last data filed at 08/01/2020 0300 Gross per 24 hour  Intake 3008.95 ml  Output --  Net 3008.95 ml   Filed Weights   08/05/2020 1240 08/28/2020 1749 08/01/20 0352  Weight: 99.8 kg 81.5 kg 82.3 kg    Exam:  GEN: NAD SKIN: Warm and dry EYES: No pallor or icterus ENT: Dry mucous membranes CV: RRR PULM: CTA B ABD: soft, ND, +BS CNS: Lethargic EXT: No edema or tenderness        Data Reviewed:   I have personally reviewed following labs and imaging studies:  Labs: Labs show the following:   Basic Metabolic Panel: Recent Labs  Lab 08/22/2020 1245 08/01/20 0513  NA 153* 160*  K 4.2 3.7  CL 117* 129*  CO2 23 22  GLUCOSE 682* 177*  BUN 57* 54*  CREATININE 2.69* 2.48*  CALCIUM 9.5 8.6*   GFR Estimated Creatinine Clearance: 19.9 mL/min (A) (by C-G formula based on SCr of 2.48 mg/dL (H)). Liver Function Tests: Recent Labs  Lab 08/17/2020 1245 08/01/20 0513  AST 141* 140*  ALT 93* 97*  ALKPHOS 73 54  BILITOT 0.8 0.7  PROT 7.7 6.4*  ALBUMIN 3.6 2.9*   No results for input(s): LIPASE, AMYLASE in the last 168 hours. No results for input(s): AMMONIA in the last 168 hours. Coagulation profile Recent Labs  Lab 08/19/2020 1245 08/01/20 0513  INR 1.3* 1.3*    CBC: Recent Labs  Lab 08/09/2020 1245  WBC 19.6*  NEUTROABS 16.3*  HGB 13.6  HCT 44.0  MCV 88.7  PLT 324   Cardiac Enzymes: No results for input(s): CKTOTAL, CKMB, CKMBINDEX, TROPONINI in the last 168 hours. BNP (last 3 results) No results for  input(s): PROBNP in the last 8760 hours. CBG: Recent Labs  Lab 08/11/2020 2039 08/09/2020 2221 08/01/20 0015 08/01/20  0418 08/01/20 0717  GLUCAP 506* 396* 250* 150* 206*   D-Dimer: No results for input(s): DDIMER in the last 72 hours. Hgb A1c: Recent Labs    08/29/2020 1809  HGBA1C 10.5*   Lipid Profile: No results for input(s): CHOL, HDL, LDLCALC, TRIG, CHOLHDL, LDLDIRECT in the last 72 hours. Thyroid function studies: No results for input(s): TSH, T4TOTAL, T3FREE, THYROIDAB in the last 72 hours.  Invalid input(s): FREET3 Anemia work up: No results for input(s): VITAMINB12, FOLATE, FERRITIN, TIBC, IRON, RETICCTPCT in the last 72 hours. Sepsis Labs: Recent Labs  Lab 08/24/2020 1245 08/22/2020 1427 08/01/20 0513  PROCALCITON  --   --  0.43  WBC 19.6*  --   --   LATICACIDVEN 3.6* 2.9*  --     Microbiology Recent Results (from the past 240 hour(s))  Blood Culture (routine x 2)     Status: None (Preliminary result)   Collection Time: 08/30/2020 12:45 PM   Specimen: BLOOD  Result Value Ref Range Status   Specimen Description BLOOD BLOOD LEFT FOREARM  Final   Special Requests   Final    BOTTLES DRAWN AEROBIC AND ANAEROBIC Blood Culture adequate volume   Culture   Final    NO GROWTH < 24 HOURS Performed at [facility], [address] [place], [place] [address]    Report Status PENDING  Incomplete  Blood Culture (routine x 2)     Status: None (Preliminary result)   Collection Time: 08/01/2020 12:45 PM   Specimen: BLOOD  Result Value Ref Range Status   Specimen Description BLOOD BLOOD RIGHT HAND  Final   Special Requests   Final    BOTTLES DRAWN AEROBIC AND ANAEROBIC Blood Culture results may not be optimal due to an inadequate volume of blood received in culture bottles   Culture   Final    NO GROWTH < 24 HOURS Performed at [facility], [address], [place], [place] [address]    Report Status PENDING  Incomplete  Resp Panel by RT-PCR (Flu A&B,  Covid) Nasopharyngeal Swab     Status: None   Collection Time: 08/16/2020  2:27 PM   Specimen: Nasopharyngeal Swab; Nasopharyngeal(NP) swabs in vial transport medium  Result Value Ref Range Status   SARS Coronavirus 2 by RT PCR NEGATIVE NEGATIVE Final    Comment: (NOTE) SARS-CoV-2 target nucleic acids are NOT DETECTED.  The SARS-CoV-2 RNA is generally detectable in upper respiratory specimens during the acute phase of infection. The lowest concentration of SARS-CoV-2 viral copies this assay can detect is 138 copies/mL. A negative result does not preclude SARS-Cov-2 infection and should not be used as the sole basis for treatment or other patient management decisions. A negative result may occur with  improper specimen collection/handling, submission of specimen other than nasopharyngeal swab, presence of viral mutation(s) within the areas targeted by this assay, and inadequate number of viral copies(<138 copies/mL). A negative result must be combined with clinical observations, patient history, and epidemiological information. The expected result is Negative.  Fact Sheet for Patients:  EntrepreneurPulse.com.au  Fact Sheet for Healthcare Providers:  IncredibleEmployment.be  This test is no t yet approved or cleared by the [place] FDA and  has been authorized for detection and/or diagnosis of SARS-CoV-2 by FDA under an Emergency Use Authorization (EUA). This EUA will remain  in effect (meaning this test can be used) for the duration of the COVID-19 declaration under Section 564(b)(1) of the Act, 21 U.S.C.section 360bbb-3(b)(1), unless the authorization is terminated  or revoked sooner.       Influenza A by PCR NEGATIVE NEGATIVE Final   Influenza B by PCR NEGATIVE NEGATIVE Final    Comment: (NOTE) The Xpert Xpress SARS-CoV-2/FLU/RSV plus assay is intended as an aid in the diagnosis of influenza from Nasopharyngeal swab specimens and should  not be used as a sole basis for treatment. Nasal washings and aspirates are unacceptable for Xpert Xpress SARS-CoV-2/FLU/RSV testing.  Fact Sheet for Patients: EntrepreneurPulse.com.au  Fact Sheet for Healthcare Providers: IncredibleEmployment.be  This test is not yet approved or cleared by the [place] FDA and has been authorized for detection and/or diagnosis of SARS-CoV-2 by FDA under an Emergency Use Authorization (EUA). This EUA will remain in effect (meaning this test can be used) for the duration of the COVID-19 declaration under Section 564(b)(1) of the Act, 21 U.S.C. section 360bbb-3(b)(1), unless the authorization is terminated or revoked.  Performed at [facility], [address] [place], [place] [address]     Procedures and diagnostic studies:  CT Head Wo Contrast  Result Date: 08/07/2020 CLINICAL DATA:  Mental status change EXAM: CT HEAD WITHOUT CONTRAST TECHNIQUE: Contiguous axial images were obtained from the base of the skull through the vertex without intravenous contrast. COMPARISON:  December 2019 FINDINGS: Brain: There is no acute intracranial hemorrhage, mass effect, or edema. Gray-white differentiation is preserved. There is no extra-axial fluid collection. Prominence of the ventricles and sulci reflects advanced generalized parenchymal volume loss. Patchy hypoattenuation in the supratentorial white matter is nonspecific but probably reflects chronic microvascular ischemic changes. 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 Small chronic right cerebellar infarct. Vascular: There is atherosclerotic calcification at the skull base. Skull: Calvarium is unremarkable. Sinuses/Orbits: No acute finding. Other: None. IMPRESSION: No acute intracranial abnormality. Chronic/nonemergent findings detailed above. Electronically Signed   By: Macy Mis M.D.   On: 08/05/2020 13:34   DG Chest Port 1 View  Result  Date: 08/12/2020 CLINICAL DATA:  Questionable sepsis. EXAM: PORTABLE CHEST 1 VIEW COMPARISON:  05/04/2020 FINDINGS: Increased right upper lobe airspace disease likely reflecting a combination of posttreatment changes and malignancy. Stable perihilar airspace disease. No definite pleural effusion. No pneumothorax. No other areas of consolidation. Stable heart size. No acute osseous abnormality peer IMPRESSION: Increased right upper lobe airspace disease likely reflecting combination of posttreatment changes and malignancy. Superimposed pneumonia cannot be excluded. Electronically Signed   By: Kathreen Devoid   On: 08/30/2020 13:54   US Abdomen Limited RUQ (LIVER/GB)  Result Date: 08/01/2020 CLINICAL DATA:  87 year old male with elevated LFTs. EXAM: ULTRASOUND ABDOMEN LIMITED RIGHT UPPER QUADRANT COMPARISON:  None. FINDINGS: Gallbladder: The gallbladder is unremarkable except for a small amount of sludge. There is no evidence of cholelithiasis or acute cholecystitis. Common bile duct: Diameter: 3 mm.  No intrahepatic or extrahepatic biliary dilatation. Liver: Diffusely increased hepatic echogenicity noted compatible with hepatic steatosis. No focal hepatic abnormalities are identified. Portal vein is patent on color Doppler imaging with normal direction of blood flow towards the liver. Other: None. IMPRESSION: 1. No evidence of acute abnormality. No biliary dilatation. 2. Hepatic steatosis. Electronically Signed   By: Margarette Canada M.D.   On: 08/28/2020 15:35               LOS: 1 day   Theia Dezeeuw  [facility]   Pager on [email or web address]. If 7PM-7AM, please contact night-coverage at [email or web address]     08/01/2020, 11:59 AM

## 2020-08-01 NOTE — Progress Notes (Signed)
[facility]) Hospital Liaison RN note:  This is a current palliative patient with [facility]. Son, Octavia Bruckner had mentioned that family may be interested in transitioning to hospice. [place] RN spoke with son and spouse, Stanton Kidney regarding hospice services. They feel certain that they would not be able to bring him home and may want to transition him to the [place] if he was appropriate. They ask for a day to discuss this with family. [facility] Liaison will follow up with them tomorrow. Hospital care team is aware.  Zandra Abts, RN [facility] Liaison [phone]

## 2020-08-01 NOTE — Progress Notes (Signed)
Inpatient Diabetes Program Recommendations  AACE/ADA: New Consensus Statement on Inpatient Glycemic Control (2015)  Target Ranges:  Prepandial:   less than 140 mg/dL      Peak postprandial:   less than 180 mg/dL (1-2 hours)      Critically ill patients:  140 - 180 mg/dL   Lab Results  Component Value Date   GLUCAP 206 (H) 08/01/2020   HGBA1C 10.5 (H) 08/11/2020    Review of Glycemic Control Results for Joel Grant, Joel Grant (MRN [id number]) as of 08/01/2020 09:37  Ref. Range 08/20/2020 20:39 08/24/2020 22:21 08/01/2020 00:15 08/01/2020 04:18 08/01/2020 07:17  Glucose-Capillary Latest Ref Range: 70 - 99 mg/dL 506 (HH) 396 (H) 250 (H) 150 (H) 206 (H)   Diabetes history: DM 2 Outpatient Diabetes medications: Metformin 1000 mg daily Current orders for Inpatient glycemic control:  None  Inpatient Diabetes Program Recommendations:    Please consider adding Novolog sensitive q 4 hours.  ? Need for low dose basal insulin at d/c just to prevent blood sugar excursions.  Will follow.   Thanks,  Adah Perl, RN, BC-ADM Inpatient Diabetes Coordinator Pager [phone] (8a-5p)

## 2020-08-01 NOTE — Progress Notes (Signed)
[place] [facility]) Hospital Liaison note:  This patient is a currently enrolled in [facility] outpatient-based Palliative Care. Will continue to follow for disposition.  Please call with any outpatient palliative questions or concerns.  Thank you, Lorelee Market, LPN [facility] Liaison [phone]

## 2020-08-02 ENCOUNTER — Inpatient Hospital Stay: Payer: Medicare HMO

## 2020-08-02 ENCOUNTER — Ambulatory Visit: Payer: Self-pay | Admitting: Family Medicine

## 2020-08-02 DIAGNOSIS — E860 Dehydration: Secondary | ICD-10-CM | POA: Diagnosis not present

## 2020-08-02 DIAGNOSIS — A419 Sepsis, unspecified organism: Secondary | ICD-10-CM | POA: Diagnosis not present

## 2020-08-02 DIAGNOSIS — J690 Pneumonitis due to inhalation of food and vomit: Secondary | ICD-10-CM | POA: Diagnosis not present

## 2020-08-02 DIAGNOSIS — G9341 Metabolic encephalopathy: Secondary | ICD-10-CM | POA: Diagnosis not present

## 2020-08-02 LAB — CBC WITH DIFFERENTIAL/PLATELET
Abs Immature Granulocytes: 0.2 10*3/uL — ABNORMAL HIGH (ref 0.00–0.07)
Basophils Absolute: 0.1 10*3/uL (ref 0.0–0.1)
Basophils Relative: 1 %
Eosinophils Absolute: 0.3 10*3/uL (ref 0.0–0.5)
Eosinophils Relative: 2 %
HCT: 41.2 % (ref 39.0–52.0)
Hemoglobin: 12.4 g/dL — ABNORMAL LOW (ref 13.0–17.0)
Immature Granulocytes: 1 %
Lymphocytes Relative: 10 %
Lymphs Abs: 1.6 10*3/uL (ref 0.7–4.0)
MCH: 28.3 pg (ref 26.0–34.0)
MCHC: 30.1 g/dL (ref 30.0–36.0)
MCV: 94.1 fL (ref 80.0–100.0)
Monocytes Absolute: 1 10*3/uL (ref 0.1–1.0)
Monocytes Relative: 6 %
Neutro Abs: 12.9 10*3/uL — ABNORMAL HIGH (ref 1.7–7.7)
Neutrophils Relative %: 80 %
Platelets: 176 10*3/uL (ref 150–400)
RBC: 4.38 MIL/uL (ref 4.22–5.81)
RDW: 15.3 % (ref 11.5–15.5)
WBC: 16.1 10*3/uL — ABNORMAL HIGH (ref 4.0–10.5)
nRBC: 0 % (ref 0.0–0.2)

## 2020-08-02 LAB — COMPREHENSIVE METABOLIC PANEL
ALT: 112 U/L — ABNORMAL HIGH (ref 0–44)
AST: 102 U/L — ABNORMAL HIGH (ref 15–41)
Albumin: 2.7 g/dL — ABNORMAL LOW (ref 3.5–5.0)
Alkaline Phosphatase: 65 U/L (ref 38–126)
Anion gap: 11 (ref 5–15)
BUN: 63 mg/dL — ABNORMAL HIGH (ref 8–23)
CO2: 19 mmol/L — ABNORMAL LOW (ref 22–32)
Calcium: 8.2 mg/dL — ABNORMAL LOW (ref 8.9–10.3)
Chloride: 127 mmol/L — ABNORMAL HIGH (ref 98–111)
Creatinine, Ser: 2.64 mg/dL — ABNORMAL HIGH (ref 0.61–1.24)
GFR, Estimated: 22 mL/min — ABNORMAL LOW (ref >60)
Glucose, Bld: 352 mg/dL — ABNORMAL HIGH (ref 70–99)
Potassium: 4.6 mmol/L (ref 3.5–5.1)
Sodium: 157 mmol/L — ABNORMAL HIGH (ref 135–145)
Total Bilirubin: 1.1 mg/dL (ref 0.3–1.2)
Total Protein: 6.2 g/dL — ABNORMAL LOW (ref 6.5–8.1)

## 2020-08-02 LAB — GLUCOSE, CAPILLARY
Glucose-Capillary: 300 mg/dL — ABNORMAL HIGH (ref 70–99)
Glucose-Capillary: 307 mg/dL — ABNORMAL HIGH (ref 70–99)
Glucose-Capillary: 309 mg/dL — ABNORMAL HIGH (ref 70–99)
Glucose-Capillary: 311 mg/dL — ABNORMAL HIGH (ref 70–99)
Glucose-Capillary: 315 mg/dL — ABNORMAL HIGH (ref 70–99)
Glucose-Capillary: 327 mg/dL — ABNORMAL HIGH (ref 70–99)
Glucose-Capillary: 377 mg/dL — ABNORMAL HIGH (ref 70–99)

## 2020-08-02 LAB — BASIC METABOLIC PANEL WITH GFR
Anion gap: 13 (ref 5–15)
BUN: 61 mg/dL — ABNORMAL HIGH (ref 8–23)
CO2: 17 mmol/L — ABNORMAL LOW (ref 22–32)
Calcium: 8.2 mg/dL — ABNORMAL LOW (ref 8.9–10.3)
Chloride: 126 mmol/L — ABNORMAL HIGH (ref 98–111)
Creatinine, Ser: 2.56 mg/dL — ABNORMAL HIGH (ref 0.61–1.24)
GFR, Estimated: 23 mL/min — ABNORMAL LOW
Glucose, Bld: 363 mg/dL — ABNORMAL HIGH (ref 70–99)
Potassium: 5.1 mmol/L (ref 3.5–5.1)
Sodium: 156 mmol/L — ABNORMAL HIGH (ref 135–145)

## 2020-08-02 LAB — PROTIME-INR
INR: 1.4 — ABNORMAL HIGH (ref 0.8–1.2)
Prothrombin Time: 17.6 seconds — ABNORMAL HIGH (ref 11.4–15.2)

## 2020-08-02 LAB — URINE CULTURE

## 2020-08-02 MED ORDER — INSULIN ASPART 100 UNIT/ML IJ SOLN
0.0000 [IU] | Freq: Three times a day (TID) | INTRAMUSCULAR | Status: DC
  Administered 2020-08-02: 5 [IU] via SUBCUTANEOUS
  Administered 2020-08-02 – 2020-08-03 (×2): 7 [IU] via SUBCUTANEOUS
  Administered 2020-08-03: 3 [IU] via SUBCUTANEOUS
  Filled 2020-08-02 (×4): qty 1

## 2020-08-02 MED ORDER — INSULIN GLARGINE 100 UNIT/ML ~~LOC~~ SOLN
10.0000 [IU] | Freq: Once | SUBCUTANEOUS | Status: AC
  Administered 2020-08-02: 10 [IU] via SUBCUTANEOUS
  Filled 2020-08-02: qty 0.1

## 2020-08-02 NOTE — Progress Notes (Signed)
[place] [facility]) Hospital Liaison RN note:  Spoke with spouse, Stanton Kidney and son, Octavia Bruckner regarding hospice referral. At this time, they do not want hospice and would like to continue with treatments for patient, including fluids and antibiotics. Hospital care team is aware. [place] Liaison will continue to follow as patient is enrolled in our palliative services in the community.   Thank you for the opportunity to participate in this patient's care.  Zandra Abts, RN [facility] Liaison [phone]

## 2020-08-02 NOTE — Progress Notes (Addendum)
Progress Note    Joel Grant  DJM:[id number] DOB: [birth year]  DOA: 08/16/2020 PCP: Jerrol Banana., MD      Brief Narrative:    Medical records reviewed and are as summarized below:  Joel Grant is a 87 y.o. male with medical history significant for dementia, CKD stage IIIb, type 2 diabetes mellitus, hypothyroidism, right upper lung mass that was concerning for cancer (biopsy could not be obtained, s/p radiation therapy), who was brought to the hospital because of generalized weakness altered mental status) rattling sounds in the lungs.   He was found to have severe sepsis secondary to pneumonia complicated by AKI and acute metabolic encephalopathy.  He also had severe hyperglycemia (glucose 682), AKI (creatinine 2.69), severe hypernatremia (sodium 157).  He was treated with IV fluids, insulin, empiric IV antibiotics.      Assessment/Plan:   Principal Problem:   Severe sepsis (HCC) Active Problems:   Adult hypothyroidism   Mass of upper lobe of right lung   Aspiration pneumonia (HCC)   Acute metabolic encephalopathy   Hypernatremia   Dehydration   Diabetes mellitus with hyperglycemia (HCC)   Transaminitis   Body mass index is 28.45 kg/m.    Severe sepsis secondary to pneumonia: Continue empiric IV antibiotics and IV fluids for now.  No growth on blood cultures thus far.  Repeat chest x-ray.  AKI on CKD stage IIIb, severe hypernatremia, dehydration: Sodium level is slowly trending down but creatinine is getting worse.  Repeat BMP today.  Continue IV fluids for now.   Acute metabolic encephalopathy with underlying dementia: Continue supportive care  Type II DM with severe hyperglycemia: Hemoglobin A1c is 10.5.  Glucose levels have been high.  Start Lantus and sliding scale insulin.  Monitor glucose levels closely.  Consult speech therapist to assess swallowing before feeding patient.  Elevated liver enzymes: This is probably due to sepsis.  Right upper  lobe mass: Suspicious for malignancy and underwent palliative radiation therapy in June 2019.  However, CT scan done in September 2021 showed continued growth of the mass.  No further work-up was pursued because of advanced age and dementia.  Plan of care discussed with patient's wife and son.  They want to continue treatment for now.  They still want to give the patient a chance and they are not ready for hospice.  Prognosis is guarded and they understand that he may not get any better.   Diet Order            Diet NPO time specified  Diet effective now                    Consultants:  None  Procedures:  None    Medications:   . enoxaparin (LOVENOX) injection  30 mg Subcutaneous Q24H  . insulin aspart  0-9 Units Subcutaneous TID WC   Continuous Infusions: . sodium chloride 125 mL/hr at 08/02/20 1124  . cefTRIAXone (ROCEPHIN)  IV 2 g (08/02/20 1123)     Anti-infectives (From admission, onward)   Start     Dose/Rate Route Frequency Ordered Stop   08/01/20 1000  cefTRIAXone (ROCEPHIN) 2 g in sodium chloride 0.9 % 100 mL IVPB        2 g 200 mL/hr over 30 Minutes Intravenous Every 24 hours 08/28/2020 1437     08/08/2020 1430  cefTRIAXone (ROCEPHIN) 2 g in sodium chloride 0.9 % 100 mL IVPB  Status:  Discontinued  2 g 200 mL/hr over 30 Minutes Intravenous Every 24 hours 08/12/2020 1418 08/18/2020 1420   08/17/2020 1430  azithromycin (ZITHROMAX) 500 mg in sodium chloride 0.9 % 250 mL IVPB  Status:  Discontinued        500 mg 250 mL/hr over 60 Minutes Intravenous Every 24 hours 08/13/2020 1418 08/03/2020 1420   08/13/2020 1330  ceFEPIme (MAXIPIME) 2 g in sodium chloride 0.9 % 100 mL IVPB        2 g 200 mL/hr over 30 Minutes Intravenous  Once 08/28/2020 1319 08/01/2020 1432   08/24/2020 1330  metroNIDAZOLE (FLAGYL) IVPB 500 mg        500 mg 100 mL/hr over 60 Minutes Intravenous  Once 08/09/2020 1319 08/23/2020 1542   08/15/2020 1330  vancomycin (VANCOCIN) IVPB 1000 mg/200 mL premix         1,000 mg 200 mL/hr over 60 Minutes Intravenous  Once 08/18/2020 1319 08/03/2020 1504             Family Communication/Anticipated D/C date and plan/Code Status   DVT prophylaxis: enoxaparin (LOVENOX) injection 30 mg Start: 08/22/2020 2200     Code Status: DNR  Family Communication: His wife and son, [name] Disposition Plan:    Status is: Inpatient  Remains inpatient appropriate because:IV treatments appropriate due to intensity of illness or inability to take PO and Inpatient level of care appropriate due to severity of illness   Dispo: The patient is from: Home              Anticipated d/c is to: Hospice house              Patient currently is not medically stable to d/c.   Difficult to place patient No           Subjective:   Interval events noted.  He is confused and unable to provide any history.  Objective:    Vitals:   08/01/20 2047 08/02/20 0449 08/02/20 0709 08/02/20 1119  BP: (!) 149/85 (!) 141/82 (!) 148/74 (!) 141/98  Pulse: 93 86 82 92  Resp: 18 18 17 17   Temp: 99.5 F (37.5 C) 99.1 F (37.3 C) 98.8 F (37.1 C) 98.6 F (37 C)  TempSrc: Oral  Axillary Axillary  SpO2: 99% 98% 99% 98%  Weight:  82.4 kg    Height:       No data found.   Intake/Output Summary (Last 24 hours) at 08/02/2020 1504 Last data filed at 08/02/2020 1135 Gross per 24 hour  Intake 2000 ml  Output 1000 ml  Net 1000 ml   Filed Weights   08/01/20 0352 08/01/20 1354 08/02/20 0449  Weight: 82.3 kg 82.3 kg 82.4 kg    Exam:  GEN: NAD SKIN: Warm and dry EYES: EOMI ENT: MMM CV: RRR PULM: Bibasilar rales.  No wheezing heard. ABD: soft, ND, NT, +BS CNS: Awake and alert but disoriented, non focal EXT: No edema or tenderness         Data Reviewed:   I have personally reviewed following labs and imaging studies:  Labs: Labs show the following:   Basic Metabolic Panel: Recent Labs  Lab 08/11/2020 1245 08/01/20 0513 08/02/20 0456  NA 153* 160* 157*  K 4.2  3.7 4.6  CL 117* 129* 127*  CO2 23 22 19*  GLUCOSE 682* 177* 352*  BUN 57* 54* 63*  CREATININE 2.69* 2.48* 2.64*  CALCIUM 9.5 8.6* 8.2*   GFR Estimated Creatinine Clearance: 18.7 mL/min (A) (by C-G formula based  on SCr of 2.64 mg/dL (H)). Liver Function Tests: Recent Labs  Lab 08/27/2020 1245 08/01/20 0513 08/02/20 0456  AST 141* 140* 102*  ALT 93* 97* 112*  ALKPHOS 73 54 65  BILITOT 0.8 0.7 1.1  PROT 7.7 6.4* 6.2*  ALBUMIN 3.6 2.9* 2.7*   No results for input(s): LIPASE, AMYLASE in the last 168 hours. No results for input(s): AMMONIA in the last 168 hours. Coagulation profile Recent Labs  Lab 08/13/2020 1245 08/01/20 0513 08/02/20 0456  INR 1.3* 1.3* 1.4*    CBC: Recent Labs  Lab 08/13/2020 1245 08/02/20 0456  WBC 19.6* 16.1*  NEUTROABS 16.3* 12.9*  HGB 13.6 12.4*  HCT 44.0 41.2  MCV 88.7 94.1  PLT 324 176   Cardiac Enzymes: No results for input(s): CKTOTAL, CKMB, CKMBINDEX, TROPONINI in the last 168 hours. BNP (last 3 results) No results for input(s): PROBNP in the last 8760 hours. CBG: Recent Labs  Lab 08/01/20 2102 08/02/20 0009 08/02/20 0447 08/02/20 0710 08/02/20 1126  GLUCAP 302* 315* 309* 377* 300*   D-Dimer: No results for input(s): DDIMER in the last 72 hours. Hgb A1c: Recent Labs    08/26/2020 1809  HGBA1C 10.5*   Lipid Profile: No results for input(s): CHOL, HDL, LDLCALC, TRIG, CHOLHDL, LDLDIRECT in the last 72 hours. Thyroid function studies: No results for input(s): TSH, T4TOTAL, T3FREE, THYROIDAB in the last 72 hours.  Invalid input(s): FREET3 Anemia work up: No results for input(s): VITAMINB12, FOLATE, FERRITIN, TIBC, IRON, RETICCTPCT in the last 72 hours. Sepsis Labs: Recent Labs  Lab 08/01/2020 1245 08/30/2020 1427 08/01/20 0513 08/02/20 0456  PROCALCITON  --   --  0.43  --   WBC 19.6*  --   --  16.1*  LATICACIDVEN 3.6* 2.9*  --   --     Microbiology Recent Results (from the past 240 hour(s))  Blood Culture (routine x 2)      Status: None (Preliminary result)   Collection Time: 08/01/2020 12:45 PM   Specimen: BLOOD  Result Value Ref Range Status   Specimen Description BLOOD BLOOD LEFT FOREARM  Final   Special Requests   Final    BOTTLES DRAWN AEROBIC AND ANAEROBIC Blood Culture adequate volume   Culture   Final    NO GROWTH 2 DAYS Performed at [facility], [address], [place], [place] [address]    Report Status PENDING  Incomplete  Blood Culture (routine x 2)     Status: None (Preliminary result)   Collection Time: 08/15/2020 12:45 PM   Specimen: BLOOD  Result Value Ref Range Status   Specimen Description BLOOD BLOOD RIGHT HAND  Final   Special Requests   Final    BOTTLES DRAWN AEROBIC AND ANAEROBIC Blood Culture results may not be optimal due to an inadequate volume of blood received in culture bottles   Culture   Final    NO GROWTH 2 DAYS Performed at [facility], [address], [place], [place] [address]    Report Status PENDING  Incomplete  Urine culture     Status: Abnormal   Collection Time: 08/01/2020  2:27 PM   Specimen: Urine, Random  Result Value Ref Range Status   Specimen Description   Final    URINE, RANDOM Performed at [facility], [address], [place], [place] [address]    Special Requests   Final    NONE Performed at [facility], [address] [place],  [address]    Culture MULTIPLE SPECIES  PRESENT, SUGGEST RECOLLECTION (A)  Final   Report Status 08/02/2020 FINAL  Final  Resp Panel by RT-PCR (Flu A&B, Covid) Nasopharyngeal Swab     Status: None   Collection Time: 08/12/2020  2:27 PM   Specimen: Nasopharyngeal Swab; Nasopharyngeal(NP) swabs in vial transport medium  Result Value Ref Range Status   SARS Coronavirus 2 by RT PCR NEGATIVE NEGATIVE Final    Comment: (NOTE) SARS-CoV-2 target nucleic acids are NOT DETECTED.  The SARS-CoV-2 RNA is generally detectable in upper respiratory specimens during the acute phase of  infection. The lowest concentration of SARS-CoV-2 viral copies this assay can detect is 138 copies/mL. A negative result does not preclude SARS-Cov-2 infection and should not be used as the sole basis for treatment or other patient management decisions. A negative result may occur with  improper specimen collection/handling, submission of specimen other than nasopharyngeal swab, presence of viral mutation(s) within the areas targeted by this assay, and inadequate number of viral copies(<138 copies/mL). A negative result must be combined with clinical observations, patient history, and epidemiological information. The expected result is Negative.  Fact Sheet for Patients:  EntrepreneurPulse.com.au  Fact Sheet for Healthcare Providers:  IncredibleEmployment.be  This test is no t yet approved or cleared by the [place] FDA and  has been authorized for detection and/or diagnosis of SARS-CoV-2 by FDA under an Emergency Use Authorization (EUA). This EUA will remain  in effect (meaning this test can be used) for the duration of the COVID-19 declaration under Section 564(b)(1) of the Act, 21 U.S.C.section 360bbb-3(b)(1), unless the authorization is terminated  or revoked sooner.       Influenza A by PCR NEGATIVE NEGATIVE Final   Influenza B by PCR NEGATIVE NEGATIVE Final    Comment: (NOTE) The Xpert Xpress SARS-CoV-2/FLU/RSV plus assay is intended as an aid in the diagnosis of influenza from Nasopharyngeal swab specimens and should not be used as a sole basis for treatment. Nasal washings and aspirates are unacceptable for Xpert Xpress SARS-CoV-2/FLU/RSV testing.  Fact Sheet for Patients: EntrepreneurPulse.com.au  Fact Sheet for Healthcare Providers: IncredibleEmployment.be  This test is not yet approved or cleared by the [place] FDA and has been authorized for detection and/or diagnosis of SARS-CoV-2  by FDA under an Emergency Use Authorization (EUA). This EUA will remain in effect (meaning this test can be used) for the duration of the COVID-19 declaration under Section 564(b)(1) of the Act, 21 U.S.C. section 360bbb-3(b)(1), unless the authorization is terminated or revoked.  Performed at [facility], [address] [place], [place] [address]     Procedures and diagnostic studies:  US Abdomen Limited RUQ (LIVER/GB)  Result Date: 08/24/2020 CLINICAL DATA:  87 year old male with elevated LFTs. EXAM: ULTRASOUND ABDOMEN LIMITED RIGHT UPPER QUADRANT COMPARISON:  None. FINDINGS: Gallbladder: The gallbladder is unremarkable except for a small amount of sludge. There is no evidence of cholelithiasis or acute cholecystitis. Common bile duct: Diameter: 3 mm.  No intrahepatic or extrahepatic biliary dilatation. Liver: Diffusely increased hepatic echogenicity noted compatible with hepatic steatosis. No focal hepatic abnormalities are identified. Portal vein is patent on color Doppler imaging with normal direction of blood flow towards the liver. Other: None. IMPRESSION: 1. No evidence of acute abnormality. No biliary dilatation. 2. Hepatic steatosis. Electronically Signed   By: Margarette Canada M.D.   On: 08/27/2020 15:35               LOS: 2 days   Libra Gatz  [facility]   Pager on  [email or web address] If 7PM-7AM, please contact night-coverage at [email or web address]     08/02/2020, 3:04 PM

## 2020-08-03 DIAGNOSIS — G9341 Metabolic encephalopathy: Secondary | ICD-10-CM | POA: Diagnosis not present

## 2020-08-03 DIAGNOSIS — E039 Hypothyroidism, unspecified: Secondary | ICD-10-CM | POA: Diagnosis not present

## 2020-08-03 DIAGNOSIS — A419 Sepsis, unspecified organism: Secondary | ICD-10-CM | POA: Diagnosis not present

## 2020-08-03 DIAGNOSIS — R4182 Altered mental status, unspecified: Secondary | ICD-10-CM | POA: Diagnosis not present

## 2020-08-03 LAB — COMPREHENSIVE METABOLIC PANEL
ALT: 96 U/L — ABNORMAL HIGH (ref 0–44)
AST: 53 U/L — ABNORMAL HIGH (ref 15–41)
Albumin: 2.6 g/dL — ABNORMAL LOW (ref 3.5–5.0)
Alkaline Phosphatase: 70 U/L (ref 38–126)
Anion gap: 9 (ref 5–15)
BUN: 59 mg/dL — ABNORMAL HIGH (ref 8–23)
CO2: 20 mmol/L — ABNORMAL LOW (ref 22–32)
Calcium: 8.4 mg/dL — ABNORMAL LOW (ref 8.9–10.3)
Chloride: 128 mmol/L — ABNORMAL HIGH (ref 98–111)
Creatinine, Ser: 2.18 mg/dL — ABNORMAL HIGH (ref 0.61–1.24)
GFR, Estimated: 28 mL/min — ABNORMAL LOW (ref >60)
Glucose, Bld: 324 mg/dL — ABNORMAL HIGH (ref 70–99)
Potassium: 4.5 mmol/L (ref 3.5–5.1)
Sodium: 157 mmol/L — ABNORMAL HIGH (ref 135–145)
Total Bilirubin: 0.9 mg/dL (ref 0.3–1.2)
Total Protein: 6.3 g/dL — ABNORMAL LOW (ref 6.5–8.1)

## 2020-08-03 LAB — CBC WITH DIFFERENTIAL/PLATELET
Abs Immature Granulocytes: 0.14 10*3/uL — ABNORMAL HIGH (ref 0.00–0.07)
Basophils Absolute: 0.1 10*3/uL (ref 0.0–0.1)
Basophils Relative: 1 %
Eosinophils Absolute: 0.4 10*3/uL (ref 0.0–0.5)
Eosinophils Relative: 3 %
HCT: 43.6 % (ref 39.0–52.0)
Hemoglobin: 13.1 g/dL (ref 13.0–17.0)
Immature Granulocytes: 1 %
Lymphocytes Relative: 10 %
Lymphs Abs: 1.6 10*3/uL (ref 0.7–4.0)
MCH: 27.9 pg (ref 26.0–34.0)
MCHC: 30 g/dL (ref 30.0–36.0)
MCV: 93 fL (ref 80.0–100.0)
Monocytes Absolute: 0.8 10*3/uL (ref 0.1–1.0)
Monocytes Relative: 5 %
Neutro Abs: 12.5 10*3/uL — ABNORMAL HIGH (ref 1.7–7.7)
Neutrophils Relative %: 80 %
Platelets: 165 10*3/uL (ref 150–400)
RBC: 4.69 MIL/uL (ref 4.22–5.81)
RDW: 15.2 % (ref 11.5–15.5)
WBC: 15.5 10*3/uL — ABNORMAL HIGH (ref 4.0–10.5)
nRBC: 0.1 % (ref 0.0–0.2)

## 2020-08-03 LAB — GLUCOSE, CAPILLARY
Glucose-Capillary: 296 mg/dL — ABNORMAL HIGH (ref 70–99)
Glucose-Capillary: 311 mg/dL — ABNORMAL HIGH (ref 70–99)
Glucose-Capillary: 281 mg/dL — ABNORMAL HIGH (ref 70–99)

## 2020-08-03 MED ORDER — INSULIN ASPART 100 UNIT/ML IJ SOLN
0.0000 [IU] | INTRAMUSCULAR | Status: DC
  Administered 2020-08-03: 5 [IU] via SUBCUTANEOUS
  Filled 2020-08-03: qty 1

## 2020-08-03 MED ORDER — INSULIN DETEMIR 100 UNIT/ML ~~LOC~~ SOLN
7.0000 [IU] | Freq: Every day | SUBCUTANEOUS | Status: DC
  Filled 2020-08-03: qty 0.07

## 2020-08-03 MED ORDER — GLYCOPYRROLATE 0.2 MG/ML IJ SOLN
0.1000 mg | INTRAMUSCULAR | Status: DC | PRN
  Administered 2020-08-04: 08:00:00 0.1 mg via INTRAVENOUS
  Filled 2020-08-03 (×2): qty 1
  Filled 2020-08-03: qty 0.5

## 2020-08-03 MED ORDER — LORAZEPAM 2 MG/ML IJ SOLN: 0.5000 mg | Freq: Four times a day (QID) | INTRAMUSCULAR | Status: DC | PRN

## 2020-08-03 MED ORDER — MORPHINE SULFATE (PF) 2 MG/ML IV SOLN: 1.0000 mg | INTRAVENOUS | Status: DC | PRN

## 2020-08-03 NOTE — Evaluation (Signed)
Clinical/Bedside Swallow Evaluation Patient Details  Name: Joel Grant MRN: [id number] Date of Birth: 1926/04/15  Today's Date: 08/03/2020 Time: SLP Start Time (ACUTE ONLY): 1025 SLP Stop Time (ACUTE ONLY): 1055 SLP Time Calculation (min) (ACUTE ONLY): 30 min  Past Medical History:  Past Medical History:  Diagnosis Date  . 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 Dementia ([place])   . Diabetes mellitus without complication ([place])   . Dyspnea   . Pneumonia   . Psoriasis   . Tuberculosis    per wife as a child   Past Surgical History:  Past Surgical History:  Procedure Laterality Date  . CATARACT EXTRACTION     HPI:  Pt is a 87 year old male with advanced dementia who presented to the ED with severe sepsis d/t pneumonia. Pt with right upper lobe mass: Suspicious for malignancy with CT scan in September 2021 that showed continued growth of mass. Pt currently receiving IV antibiotic with no improvement in functional ability.   Assessment / Plan / Recommendation Clinical Impression  Pt's oropharyngeal abilities are severely impaired as a direct result of current cognitive state. As such, he is at an extremely high risk of aspiration, malnutrition and dehydration. Pt's wife and son were present during evaluation. They state that has not been able to carry on a conversation for > 1 year but did answer yes/no questions. For ~ 4 weeks, he has required increased help from his son to dress and to self-feed (pt appeared very fatigued). 4 days ago, pt lost ability to speak. During this evaluation, pt briefly responded to his name and open his eyes. He was not able to produce any speech despite an apparent effort. Audible rattle was heard with every breath, possibly indicated difficulty with secretion management.  One trial of ice chip was administered to assess for any oropharyngeal response. Pt didn't have any visible response and indicated "NO" when asked if he  was thirsty or wanted a second trial of ice chip. Pt's prognosis if very poor. Education provided to pt's wife and his son on realistic prognosis for a safe return to PO intake for nutrition/hydration. They further provided that pt had started coughing when eating the week prior to admission. At this time, recommend a Palliative Care consult be made for face to face meeting with pt, his wife and his son to establish [place]. Recommend NPO. SLP Visit Diagnosis: Dysphagia, unspecified (R13.10);Cognitive communication deficit (R41.841)    Aspiration Risk  Severe aspiration risk;Risk for inadequate nutrition/hydration    Diet Recommendation NPO   Medication Administration: Via alternative means    Other  Recommendations Oral Care Recommendations: Oral care QID   Follow up Recommendations  (Palliative Care/Hospice)      Frequency and Duration min 1 x/week  2 weeks       Prognosis Prognosis for Safe Diet Advancement:  (Poor) Barriers to Reach Goals: Severity of deficits      Swallow Study   General Date of Onset: 08/02/2020 HPI: Pt is a 87 year old male with advanced dementia who presented to the ED with severe sepsis d/t pneumonia. Pt with right upper lobe mass: Suspicious for malignancy with CT scan in September 2021 that showed continued growth of mass. Pt currently receiving IV antibiotic with no improvement in functional ability. Type of Study: Bedside Swallow Evaluation Previous Swallow Assessment: none in chart Diet Prior to this Study: NPO Temperature Spikes Noted: No Respiratory Status: Room air  History of Recent Intubation: No Behavior/Cognition: Lethargic/Drowsy Oral Cavity Assessment: Dried secretions Oral Care Completed by SLP: Recent completion by staff Self-Feeding Abilities: Total assist Patient Positioning: Upright in bed Baseline Vocal Quality:  (nonverbal) Volitional Cough: Cognitively unable to elicit Volitional Swallow: Unable to elicit    Oral/Motor/Sensory  Function Overall Oral Motor/Sensory Function: Severe impairment (unable to fully assess d/t pt's cognitive impairments)   Ice Chips Ice chips: Impaired Presentation: Spoon Oral Phase Impairments: Reduced labial seal;Reduced lingual movement/coordination;Poor awareness of bolus Oral Phase Functional Implications: Prolonged oral transit;Oral holding   Thin Liquid Thin Liquid: Not tested    Nectar Thick Nectar Thick Liquid: Not tested   Honey Thick Honey Thick Liquid: Not tested   Puree Puree: Not tested   Solid     Solid: Not tested     Joel Grant B. Rutherford Nail M.S., [place], [place] Pathologist Rehabilitation Services Office [place] 08/03/2020,1:36 PM

## 2020-08-03 NOTE — Progress Notes (Addendum)
[facility]                                                                              Patient Demographics  Joel Grant, is a 87 y.o. male, DOB - 09/28/1926, GEZ:[id number]  Admit date - 08/10/2020   Admitting Physician Collier Bullock, MD  Outpatient Primary MD for the patient is Jerrol Banana., MD  Outpatient specialists:   LOS - 3  days   Medical records reviewed and are as summarized below:    Chief Complaint  Patient presents with  . Altered Mental Status       Brief summary    Joel Grant is a 87 y.o. male with medical history significant for dementia, CKD stage IIIb, type 2 diabetes mellitus, hypothyroidism, right upper lung mass that was concerning for cancer (biopsy could not be obtained, s/p radiation therapy), who was brought to the hospital because of generalized weakness altered mental status) rattling sounds in the lungs.  He was found to have severe sepsis secondary to pneumonia complicated by AKI and acute metabolic encephalopathy.  He also had severe hyperglycemia (glucose 682), AKI (creatinine 2.69), severe hypernatremia (sodium 157).  He was treated with IV fluids, insulin, empiric IV antibiotics.  Overall poor prognosis, palliative medicine consulted.   Assessment & Plan    Principal Problem:   Severe sepsis ([place]) secondary to pneumonia, likely aspiration -Coarse breath sounds throughout, continue empiric IV antibiotics -Blood cultures negative till date -SLP evaluation today 5/4 with severe aspiration risk, continue n.p.o.  Active Problems:  Acute kidney injury on CKD stage IIIb, severe hypernatremia, dehydration -Baseline  creatinine 1.5-1.6 in 2021, presented with creatinine of 2.69 on admission -Creatinine improved to 2.1 with IV fluid hydration now with coarse breath sounds throughout  -Chest x-ray 5/3 showed persistent right upper lobe density with known lung carcinoma and associated consolidation   Acute  metabolic encephalopathy superimposed on dementia -Overall poor prognosis, severe aspiration risk, advancing dementia, history of lung CA, no significant improvement -Palliative medicine consulted for goals of care     Mass of upper lobe of right lung, lung CA -Suspicious for malignancy, underwent palliative radiation therapy in June 2019. -CT in 12/2019 showed continued growth of the mass.  No work-up was pursued because of advanced age and dementia     Diabetes mellitus with hyperglycemia (HCC) -Hemoglobin A1c 10.5 -Added sensitive sliding scale insulin every 4 hours, low-dose Lantus 7 units daily    Transaminitis -Likely due to sepsis  Goals of care:  Met with patient's son and wife at the bedside.  Discussed all the above, no significant improvement since admission, high aspiration risk, n.p.o., overall poor prognosis. Patient's wife and son requested [facility] inpatient [facility], comfort care. DC telemetry, placed on comfort measures, IV Robinul, Ativan, morphine as needed for comfort, respiratory distress or agitation.  Code Status: DNR DVT Prophylaxis:  enoxaparin (LOVENOX) injection 30 mg Start: 08/05/2020 2200   Level of Care: Level of care: Med-Surg Family Communication: Discussed with patient's son and wife at the bedside   Disposition Plan:     Status is: Inpatient  Remains inpatient appropriate because:Inpatient  level of care appropriate due to severity of illness   Dispo: The patient is from: Home              Anticipated d/c is to: TBD              Patient currently is not medically stable to d/c. Need goals of care    Difficult to place patient No      Time Spent in minutes   20mns   Procedures:    Consultants:   Palliative   Antimicrobials:   Anti-infectives (From admission, onward)   Start     Dose/Rate Route Frequency Ordered Stop   08/01/20 1000  cefTRIAXone (ROCEPHIN) 2 g in sodium chloride 0.9 % 100 mL IVPB        2 g 200  mL/hr over 30 Minutes Intravenous Every 24 hours 08/03/2020 1437     08/05/2020 1430  cefTRIAXone (ROCEPHIN) 2 g in sodium chloride 0.9 % 100 mL IVPB  Status:  Discontinued        2 g 200 mL/hr over 30 Minutes Intravenous Every 24 hours 08/23/2020 1418 08/07/2020 1420   08/20/2020 1430  azithromycin (ZITHROMAX) 500 mg in sodium chloride 0.9 % 250 mL IVPB  Status:  Discontinued        500 mg 250 mL/hr over 60 Minutes Intravenous Every 24 hours 08/03/2020 1418 08/07/2020 1420   08/12/2020 1330  ceFEPIme (MAXIPIME) 2 g in sodium chloride 0.9 % 100 mL IVPB        2 g 200 mL/hr over 30 Minutes Intravenous  Once 08/11/2020 1319 08/07/2020 1432   08/22/2020 1330  metroNIDAZOLE (FLAGYL) IVPB 500 mg        500 mg 100 mL/hr over 60 Minutes Intravenous  Once 08/10/2020 1319 08/29/2020 1542   08/24/2020 1330  vancomycin (VANCOCIN) IVPB 1000 mg/200 mL premix        1,000 mg 200 mL/hr over 60 Minutes Intravenous  Once 08/29/2020 1319 08/15/2020 1504          Medications  Scheduled Meds: . enoxaparin (LOVENOX) injection  30 mg Subcutaneous Q24H  . insulin aspart  0-9 Units Subcutaneous TID WC   Continuous Infusions: . sodium chloride 125 mL/hr at 08/03/20 0628  . cefTRIAXone (ROCEPHIN)  IV 2 g (08/03/20 0910)   PRN Meds:.acetaminophen **OR** acetaminophen, ondansetron **OR** ondansetron (ZOFRAN) IV      Subjective:   Joel Kreiderwas seen and examined today.  Still confused, unable to provide ROS.  Ill-appearing.  Coarse breath sounds throughout.   Objective:   Vitals:   08/03/20 0300 08/03/20 0751 08/03/20 1135 08/03/20 1441  BP:  (!) 148/74 112/82 (!) 141/79  Pulse:  88 90 94  Resp:  _0 Temp:  99.1 F (37.3 C) 98.6 F (37 C) 98.6 F (37 C)  TempSrc:  Oral    SpO2:  (!) 87% (!) 88% (!) 87%  Weight: 81.8 kg     Height:        Intake/Output Summary (Last 24 hours) at 08/03/2020 1441 Last data filed at 08/03/2020 0400 Gross per 24 hour  Intake 1325 ml  Output 650 ml  Net 675 ml     Wt Readings  from Last 3 Encounters:  08/03/20 81.8 kg  05/25/20 87.4 kg  05/04/20 88 kg     Exam  General: awake, ill appearing    Cardiovascular: S1 S2 auscultated,  RRR  Respiratory: coarse breath sounds b/l    Gastrointestinal: Soft, nontender, nondistended, +  bowel sounds  Ext: no pedal edema bilaterally  Neuro: does not follow commands   Musculoskeletal: No digital cyanosis, clubbing  Skin: No rashes  Psych: confused    Data Reviewed:  I have personally reviewed following labs and imaging studies  Micro Results Recent Results (from the past 240 hour(s))  Blood Culture (routine x 2)     Status: None (Preliminary result)   Collection Time: 08/11/2020 12:45 PM   Specimen: BLOOD  Result Value Ref Range Status   Specimen Description BLOOD BLOOD LEFT FOREARM  Final   Special Requests   Final    BOTTLES DRAWN AEROBIC AND ANAEROBIC Blood Culture adequate volume   Culture   Final    NO GROWTH 3 DAYS Performed at [facility], [address], [place], [place] [address]    Report Status PENDING  Incomplete  Blood Culture (routine x 2)     Status: None (Preliminary result)   Collection Time: 08/26/2020 12:45 PM   Specimen: BLOOD  Result Value Ref Range Status   Specimen Description BLOOD BLOOD RIGHT HAND  Final   Special Requests   Final    BOTTLES DRAWN AEROBIC AND ANAEROBIC Blood Culture results may not be optimal due to an inadequate volume of blood received in culture bottles   Culture   Final    NO GROWTH 3 DAYS Performed at [facility], [address], [place], [place] [address]    Report Status PENDING  Incomplete  Urine culture     Status: Abnormal   Collection Time: 08/09/2020  2:27 PM   Specimen: Urine, Random  Result Value Ref Range Status   Specimen Description   Final    URINE, RANDOM Performed at [facility], [address], [place], [place] [address]    Special Requests   Final    NONE Performed at [facility], [address], [place], [place] [address]    Culture MULTIPLE SPECIES PRESENT, SUGGEST RECOLLECTION (A)  Final   Report Status 08/02/2020 FINAL  Final  Resp Panel by RT-PCR (Flu A&B, Covid) Nasopharyngeal Swab     Status: None   Collection Time: 08/11/2020  2:27 PM   Specimen: Nasopharyngeal Swab; Nasopharyngeal(NP) swabs in vial transport medium  Result Value Ref Range Status   SARS Coronavirus 2 by RT PCR NEGATIVE NEGATIVE Final    Comment: (NOTE) SARS-CoV-2 target nucleic acids are NOT DETECTED.  The SARS-CoV-2 RNA is generally detectable in upper respiratory specimens during the acute phase of infection. The lowest concentration of SARS-CoV-2 viral copies this assay can detect is 138 copies/mL. A negative result does not preclude SARS-Cov-2 infection and should not be used as the sole basis for treatment or other patient management decisions. A negative result may occur with  improper specimen collection/handling, submission of specimen other than nasopharyngeal swab, presence of viral mutation(s) within the areas targeted by this assay, and inadequate number of viral copies(<138 copies/mL). A negative result must be combined with clinical observations, patient history, and epidemiological information. The expected result is Negative.  Fact Sheet for Patients:  EntrepreneurPulse.com.au  Fact Sheet for Healthcare Providers:  IncredibleEmployment.be  This test is no t yet approved or cleared by the [place] FDA and  has been authorized for detection and/or diagnosis of SARS-CoV-2 by FDA under an Emergency Use Authorization (EUA). This EUA will remain  in effect (meaning this test can be used) for the duration of the COVID-19 declaration under Section 564(b)(1) of the Act, 21 U.S.C.section  360bbb-3(b)(1), unless the authorization is terminated  or revoked sooner.       Influenza A by PCR NEGATIVE NEGATIVE Final   Influenza B by PCR  NEGATIVE NEGATIVE Final    Comment: (NOTE) The Xpert Xpress SARS-CoV-2/FLU/RSV plus assay is intended as an aid in the diagnosis of influenza from Nasopharyngeal swab specimens and should not be used as a sole basis for treatment. Nasal washings and aspirates are unacceptable for Xpert Xpress SARS-CoV-2/FLU/RSV testing.  Fact Sheet for Patients: EntrepreneurPulse.com.au  Fact Sheet for Healthcare Providers: IncredibleEmployment.be  This test is not yet approved or cleared by the [place] FDA and has been authorized for detection and/or diagnosis of SARS-CoV-2 by FDA under an Emergency Use Authorization (EUA). This EUA will remain in effect (meaning this test can be used) for the duration of the COVID-19 declaration under Section 564(b)(1) of the Act, 21 U.S.C. section 360bbb-3(b)(1), unless the authorization is terminated or revoked.  Performed at [facility], [address], [place], [place] [address]     Radiology Reports CT Head Wo Contrast  Result Date: 08/08/2020 CLINICAL DATA:  Mental status change EXAM: CT HEAD WITHOUT CONTRAST TECHNIQUE: Contiguous axial images were obtained from the base of the skull through the vertex without intravenous contrast. COMPARISON:  December 2019 FINDINGS: Brain: There is no acute intracranial hemorrhage, mass effect, or edema. Gray-white differentiation is preserved. There is no extra-axial fluid collection. Prominence of the ventricles and sulci reflects advanced generalized parenchymal volume loss. Patchy hypoattenuation in the supratentorial white matter is nonspecific but probably reflects chronic microvascular ischemic changes. 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 Small chronic right cerebellar infarct. Vascular: There is atherosclerotic calcification at the skull base. Skull: Calvarium is unremarkable. Sinuses/Orbits: No acute finding. Other: None. IMPRESSION: No acute  intracranial abnormality. Chronic/nonemergent findings detailed above. Electronically Signed   By: Macy Mis M.D.   On: 08/03/2020 13:34   DG Chest Port 1 View  Result Date: 08/02/2020 CLINICAL DATA:  CHF and altered mental status, known lung carcinoma EXAM: PORTABLE CHEST 1 VIEW COMPARISON:  08/22/2020 FINDINGS: Cardiac shadow is stable. Aortic calcifications are again seen. Increased density in the right upper lobe is again noted consistent with the known right upper lobe mass and associated consolidation. Left lung remains clear. Mild right basilar atelectasis is noted. IMPRESSION: Persistent right upper lobe density consistent with underlying known lung carcinoma and associated consolidation. New mild right basilar atelectasis is noted. Electronically Signed   By: Inez Catalina M.D.   On: 08/02/2020 16:00   DG Chest Port 1 View  Result Date: 08/08/2020 CLINICAL DATA:  Questionable sepsis. EXAM: PORTABLE CHEST 1 VIEW COMPARISON:  05/04/2020 FINDINGS: Increased right upper lobe airspace disease likely reflecting a combination of posttreatment changes and malignancy. Stable perihilar airspace disease. No definite pleural effusion. No pneumothorax. No other areas of consolidation. Stable heart size. No acute osseous abnormality peer IMPRESSION: Increased right upper lobe airspace disease likely reflecting combination of posttreatment changes and malignancy. Superimposed pneumonia cannot be excluded. Electronically Signed   By: Kathreen Devoid   On: 08/17/2020 13:54   US Abdomen Limited RUQ (LIVER/GB)  Result Date: 08/17/2020 CLINICAL DATA:  87 year old male with elevated LFTs. EXAM: ULTRASOUND ABDOMEN LIMITED RIGHT UPPER QUADRANT COMPARISON:  None. FINDINGS: Gallbladder: The gallbladder is unremarkable except for a small amount of sludge. There is no evidence of cholelithiasis or acute cholecystitis. Common bile duct: Diameter: 3 mm.  No intrahepatic or extrahepatic biliary dilatation. Liver: Diffusely  increased  hepatic echogenicity noted compatible with hepatic steatosis. No focal hepatic abnormalities are identified. Portal vein is patent on color Doppler imaging with normal direction of blood flow towards the liver. Other: None. IMPRESSION: 1. No evidence of acute abnormality. No biliary dilatation. 2. Hepatic steatosis. Electronically Signed   By: Margarette Canada M.D.   On: 08/27/2020 15:35    Lab Data:  CBC: Recent Labs  Lab 08/19/2020 1245 08/02/20 0456 08/03/20 0346  WBC 19.6* 16.1* 15.5*  NEUTROABS 16.3* 12.9* 12.5*  HGB 13.6 12.4* 13.1  HCT 44.0 41.2 43.6  MCV 88.7 94.1 93.0  PLT 324 176 130   Basic Metabolic Panel: Recent Labs  Lab 08/14/2020 1245 08/01/20 0513 08/02/20 0456 08/02/20 1445 08/03/20 0346  NA 153* 160* 157* 156* 157*  K 4.2 3.7 4.6 5.1 4.5  CL 117* 129* 127* 126* 128*  CO2 23 22 19* 17* 20*  GLUCOSE 682* 177* 352* 363* 324*  BUN 57* 54* 63* 61* 59*  CREATININE 2.69* 2.48* 2.64* 2.56* 2.18*  CALCIUM 9.5 8.6* 8.2* 8.2* 8.4*   GFR: Estimated Creatinine Clearance: 22.6 mL/min (A) (by C-G formula based on SCr of 2.18 mg/dL (H)). Liver Function Tests: Recent Labs  Lab 08/24/2020 1245 08/01/20 0513 08/02/20 0456 08/03/20 0346  AST 141* 140* 102* 53*  ALT 93* 97* 112* 96*  ALKPHOS 73 54 65 70  BILITOT 0.8 0.7 1.1 0.9  PROT 7.7 6.4* 6.2* 6.3*  ALBUMIN 3.6 2.9* 2.7* 2.6*   No results for input(s): LIPASE, AMYLASE in the last 168 hours. No results for input(s): AMMONIA in the last 168 hours. Coagulation Profile: Recent Labs  Lab 08/08/2020 1245 08/01/20 0513 08/02/20 0456  INR 1.3* 1.3* 1.4*   Cardiac Enzymes: No results for input(s): CKTOTAL, CKMB, CKMBINDEX, TROPONINI in the last 168 hours. BNP (last 3 results) No results for input(s): PROBNP in the last 8760 hours. HbA1C: Recent Labs    08/07/2020 1809  HGBA1C 10.5*   CBG: Recent Labs  Lab 08/02/20 1617 08/02/20 2047 08/02/20 2311 08/03/20 0751 08/03/20 1135  GLUCAP 327* 307* 311*  296* 311*   Lipid Profile: No results for input(s): CHOL, HDL, LDLCALC, TRIG, CHOLHDL, LDLDIRECT in the last 72 hours. Thyroid Function Tests: No results for input(s): TSH, T4TOTAL, FREET4, T3FREE, THYROIDAB in the last 72 hours. Anemia Panel: No results for input(s): VITAMINB12, FOLATE, FERRITIN, TIBC, IRON, RETICCTPCT in the last 72 hours. Urine analysis:    Component Value Date/Time   COLORURINE YELLOW (A) 08/08/2020 1427   APPEARANCEUR HAZY (A) 08/14/2020 1427   APPEARANCEUR Clear 03/21/2020 1200   LABSPEC 1.021 08/02/2020 1427   PHURINE 5.0 08/24/2020 1427   GLUCOSEU >=500 (A) 08/21/2020 1427   HGBUR LARGE (A) 08/10/2020 1427   BILIRUBINUR NEGATIVE 08/06/2020 1427   BILIRUBINUR Negative 03/21/2020 1200   KETONESUR NEGATIVE 08/15/2020 1427   PROTEINUR 100 (A) 08/21/2020 1427   UROBILINOGEN 0.2 12/15/2019 1410   NITRITE NEGATIVE 08/03/2020 1427   LEUKOCYTESUR NEGATIVE 08/27/2020 1427       M.D. [facility] 08/03/2020, 2:41 PM  Available via Epic secure chat 7am-7pm After 7 pm, please refer to night coverage provider listed on amion.

## 2020-08-03 NOTE — Progress Notes (Signed)
Patient transferred at this time via facility transporation services via bed to room 128, tele box removed and IV medications discontinued. Chart with transportation personnel. RN given report on 1C. Family at bedside and aware of room change. NAD noted upon departure. BIlateral mittens remains intact.

## 2020-08-03 NOTE — Progress Notes (Signed)
Inpatient Diabetes Program Recommendations  AACE/ADA: New Consensus Statement on Inpatient Glycemic Control (2015)  Target Ranges:  Prepandial:   less than 140 mg/dL      Peak postprandial:   less than 180 mg/dL (1-2 hours)      Critically ill patients:  140 - 180 mg/dL   Results for BRENTLEE, DELAGE (MRN [id number]) as of 08/03/2020 07:39  Ref. Range 08/02/2020 07:10 08/02/2020 11:26 08/02/2020 16:17 08/02/2020 20:47 08/02/2020 23:11  Glucose-Capillary Latest Ref Range: 70 - 99 mg/dL 377 (H) 300 (H)  5 units NOVOLOG  327 (H)  7 units NOVOLOG  10 units LANTUS @3 :03pm  307 (H) 311 (H)   Results for SAKSHAM, AKKERMAN (MRN [id number]) as of 08/03/2020 09:32  Ref. Range 08/03/2020 07:51  Glucose-Capillary Latest Ref Range: 70 - 99 mg/dL 296 (H)  3 units NOVOLOG    Admit with: Severe sepsis secondary to pneumonia  Home DM Meds: Metformin 1000 mg Daily  Current Orders: Novolog Sensitive Correction Scale/ SSI (0-9 units) TID AC     MD- Note family's refusal of Hospice services at this time per notes.  CBG 296 this AM--Pt did receive 10 units Lantus X 1 dose yesterday afternoon--No Lantus ordered for today  If more aggressive CBG control desired, please consider the following:  1. Start Lantus 15 units Daily (0.2 units/kg)   2. Increase Novolog SSi to the Moderate scale (0-15 units) Q4 hours (currently NPO)     --Will follow patient during hospitalization--  Wyn Quaker RN, MSN, CDE Diabetes Coordinator Inpatient Glycemic Control Team Team Pager: [phone] (8a-5p)

## 2020-08-03 NOTE — Progress Notes (Addendum)
     Referral received for Joel Grant :goals of care discussion. Chart reviewed and updates received from SLP and attending. Patient assessed and is unable to engage appropriately in discussions. Attempted to contact patient's son, Joel Grant on both listed numbers ([phone]) . Unable to reach. Voicemail left with contact information given.   Additional attempts made to reach son @ 16. Unable to reach. Contact information has been left on voicemail.  PMT will re-attempt to contact family at a later time/date. Detailed note and recommendations to follow once GOC has been completed.   Son returned call. Spoke to him and wife. Introduced myself and my role. They have requested to meet @ 1130 on 08/15/20. Family aware I will meet them at the bedside.   Thank you for your referral and allowing PMT to assist in [name]'s care.   Alda Lea, AGPCNP-BC Palliative Medicine Team  Phone: [phone] Pager: [phone] Amion: N. [place]   NO CHARGE

## 2020-08-04 DIAGNOSIS — A419 Sepsis, unspecified organism: Secondary | ICD-10-CM | POA: Diagnosis not present

## 2020-08-04 DIAGNOSIS — G9341 Metabolic encephalopathy: Secondary | ICD-10-CM | POA: Diagnosis not present

## 2020-08-04 DIAGNOSIS — J690 Pneumonitis due to inhalation of food and vomit: Secondary | ICD-10-CM | POA: Diagnosis not present

## 2020-08-04 DIAGNOSIS — R918 Other nonspecific abnormal finding of lung field: Secondary | ICD-10-CM

## 2020-08-04 DIAGNOSIS — Z66 Do not resuscitate: Secondary | ICD-10-CM

## 2020-08-04 DIAGNOSIS — Z7189 Other specified counseling: Secondary | ICD-10-CM

## 2020-08-04 DIAGNOSIS — Z515 Encounter for palliative care: Secondary | ICD-10-CM

## 2020-08-05 LAB — CULTURE, BLOOD (ROUTINE X 2)
Culture: NO GROWTH
Culture: NO GROWTH
Special Requests: ADEQUATE

## 2020-08-22 ENCOUNTER — Ambulatory Visit: Payer: Medicare HMO | Admitting: Podiatry

## 2020-08-25 ENCOUNTER — Ambulatory Visit: Payer: Medicare HMO | Admitting: Podiatry

## 2020-08-31 NOTE — Death Summary Note (Signed)
DEATH SUMMARY   Patient Details  Name: Joel Grant MRN: [id number] DOB: 12-19-1926  Admission/Discharge Information   Admit Date:  Aug 15, 2020  Date of Death: Date of Death: Aug 19, 2020  Time of Death: Time of Death: Jun 24, 1224 PM  Length of Stay: 4  Referring Physician: Jerrol Banana., MD   Reason(s) for Hospitalization  Generalized weakness with shortness of breath  Diagnoses   Preliminary cause of death: Severe sepsis Secondary Diagnoses (including complications and co-morbidities):  Aspiration pneumonia Acute kidney injury on CKD stage IIIb Severe hypernatremia Dehydration Acute metabolic encephalopathy superimposed on dementia Known history of right lung malignancy Diabetes mellitus 2, NIDDM with hyperglycemia Transaminitis DNR, comfort care   Brief Hospital Course (including significant findings, care, treatment, and services provided and events leading to death)   Joel Grant a 1987-06-26 y.o.malewith medical history significant for dementia, CKD stage IIIb, type 2 diabetes mellitus, hypothyroidism, right upper lung mass that was concerning for cancer (biopsy could not be obtained, s/p radiation therapy), who was brought to the hospital because of generalized weakness altered mental status)rattling soundsin the lungs. He was found to have severe sepsis secondary to pneumonia complicated by AKI and acute metabolic encephalopathy. He also had severe hyperglycemia(glucose 682), AKI(creatinine 2.69), severe hypernatremia(sodium 157). He was treated with IV fluids, insulin, empiric IV antibiotics. Overall poor prognosis, palliative medicine was consulted.   Severe sepsis ([place]) secondary to pneumonia, likely aspiration -Patient was placed on IV antibiotics.  Blood cultures remained negative till date.   SLP evaluation on 5/4 showed severe aspiration risk, patient was continued n.p.o. status, IV fluids.   -Palliative medicine was consulted, also had goals of care  discussed with the family who requested comfort measures   Acute kidney injury on CKD stage IIIb, severe hypernatremia, dehydration -Baseline  creatinine 1.5-1.6 in 06-25-2019, presented with creatinine of 2.69 on admission -Creatinine improved to 2.1 with IV fluid hydration  -Chest x-ray 5/3 showed persistent right upper lobe density with known lung carcinoma and associated consolidation Patient was placed on comfort measures  Acute metabolic encephalopathy superimposed on dementia -Overall poor prognosis, severe aspiration risk, advancing dementia, history of lung CA, no significant improvement -Palliative medicine consulted for goals of care, was placed on comfort measures per family's request     Mass of upper lobe of right lung, lung CA -Suspicious for malignancy, underwent palliative radiation therapy in June 2019. -CT in 12/2019 showed continued growth of the mass.  No work-up was pursued because of advanced age and dementia     Diabetes mellitus with hyperglycemia ([place]) -Hemoglobin A1c 10.5 -Patient was placed on sensitive sliding scale insulin and Lantus.    Transaminitis -Likely due to sepsis  Patient passed on 2020/08/19 at 12:26 PM with family at the bedside.   Pertinent Labs and Studies  Significant Diagnostic Studies CT Head Wo Contrast  Result Date: Aug 15, 2020 CLINICAL DATA:  Mental status change EXAM: CT HEAD WITHOUT CONTRAST TECHNIQUE: Contiguous axial images were obtained from the base of the skull through the vertex without intravenous contrast. COMPARISON:  December 2019 FINDINGS: Brain: There is no acute intracranial hemorrhage, mass effect, or edema. Gray-white differentiation is preserved. There is no extra-axial fluid collection. Prominence of the ventricles and sulci reflects advanced generalized parenchymal volume loss. Patchy hypoattenuation in the supratentorial white matter is nonspecific but probably reflects chronic microvascular ischemic changes. 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 Small chronic right cerebellar infarct. Vascular: There is atherosclerotic calcification at the skull base. Skull:  Calvarium is unremarkable. Sinuses/Orbits: No acute finding. Other: None. IMPRESSION: No acute intracranial abnormality. Chronic/nonemergent findings detailed above. Electronically Signed   By: Macy Mis M.D.   On: 08/28/2020 13:34   DG Chest Port 1 View  Result Date: 08/02/2020 CLINICAL DATA:  CHF and altered mental status, known lung carcinoma EXAM: PORTABLE CHEST 1 VIEW COMPARISON:  08/03/2020 FINDINGS: Cardiac shadow is stable. Aortic calcifications are again seen. Increased density in the right upper lobe is again noted consistent with the known right upper lobe mass and associated consolidation. Left lung remains clear. Mild right basilar atelectasis is noted. IMPRESSION: Persistent right upper lobe density consistent with underlying known lung carcinoma and associated consolidation. New mild right basilar atelectasis is noted. Electronically Signed   By: Inez Catalina M.D.   On: 08/02/2020 16:00   DG Chest Port 1 View  Result Date: 08/03/2020 CLINICAL DATA:  Questionable sepsis. EXAM: PORTABLE CHEST 1 VIEW COMPARISON:  05/04/2020 FINDINGS: Increased right upper lobe airspace disease likely reflecting a combination of posttreatment changes and malignancy. Stable perihilar airspace disease. No definite pleural effusion. No pneumothorax. No other areas of consolidation. Stable heart size. No acute osseous abnormality peer IMPRESSION: Increased right upper lobe airspace disease likely reflecting combination of posttreatment changes and malignancy. Superimposed pneumonia cannot be excluded. Electronically Signed   By: Kathreen Devoid   On: 08/24/2020 13:54   US Abdomen Limited RUQ (LIVER/GB)  Result Date: 08/29/2020 CLINICAL DATA:  87 year old male with elevated LFTs. EXAM: ULTRASOUND ABDOMEN LIMITED RIGHT UPPER QUADRANT COMPARISON:  None.  FINDINGS: Gallbladder: The gallbladder is unremarkable except for a small amount of sludge. There is no evidence of cholelithiasis or acute cholecystitis. Common bile duct: Diameter: 3 mm.  No intrahepatic or extrahepatic biliary dilatation. Liver: Diffusely increased hepatic echogenicity noted compatible with hepatic steatosis. No focal hepatic abnormalities are identified. Portal vein is patent on color Doppler imaging with normal direction of blood flow towards the liver. Other: None. IMPRESSION: 1. No evidence of acute abnormality. No biliary dilatation. 2. Hepatic steatosis. Electronically Signed   By: Margarette Canada M.D.   On: 08/12/2020 15:35    Microbiology Recent Results (from the past 240 hour(s))  Blood Culture (routine x 2)     Status: None (Preliminary result)   Collection Time: 08/28/2020 12:45 PM   Specimen: BLOOD  Result Value Ref Range Status   Specimen Description BLOOD BLOOD LEFT FOREARM  Final   Special Requests   Final    BOTTLES DRAWN AEROBIC AND ANAEROBIC Blood Culture adequate volume   Culture   Final    NO GROWTH 4 DAYS Performed at [facility], [address], [place], [place] [address]    Report Status PENDING  Incomplete  Blood Culture (routine x 2)     Status: None (Preliminary result)   Collection Time: 08/30/2020 12:45 PM   Specimen: BLOOD  Result Value Ref Range Status   Specimen Description BLOOD BLOOD RIGHT HAND  Final   Special Requests   Final    BOTTLES DRAWN AEROBIC AND ANAEROBIC Blood Culture results may not be optimal due to an inadequate volume of blood received in culture bottles   Culture   Final    NO GROWTH 4 DAYS Performed at [facility], [address], [place],  [address]    Report Status PENDING  Incomplete  Urine culture     Status: Abnormal   Collection Time: 08/02/2020  2:27 PM   Specimen: Urine, Random  Result Value Ref Range  Status   Specimen Description   Final    URINE, RANDOM Performed at [facility], [address] [place], [place] [address]    Special Requests   Final    NONE Performed at [facility], [address] [place], [place] [address]    Culture MULTIPLE SPECIES PRESENT, SUGGEST RECOLLECTION (A)  Final   Report Status 08/02/2020 FINAL  Final  Resp Panel by RT-PCR (Flu A&B, Covid) Nasopharyngeal Swab     Status: None   Collection Time: 08/19/2020  2:27 PM   Specimen: Nasopharyngeal Swab; Nasopharyngeal(NP) swabs in vial transport medium  Result Value Ref Range Status   SARS Coronavirus 2 by RT PCR NEGATIVE NEGATIVE Final    Comment: (NOTE) SARS-CoV-2 target nucleic acids are NOT DETECTED.  The SARS-CoV-2 RNA is generally detectable in upper respiratory specimens during the acute phase of infection. The lowest concentration of SARS-CoV-2 viral copies this assay can detect is 138 copies/mL. A negative result does not preclude SARS-Cov-2 infection and should not be used as the sole basis for treatment or other patient management decisions. A negative result may occur with  improper specimen collection/handling, submission of specimen other than nasopharyngeal swab, presence of viral mutation(s) within the areas targeted by this assay, and inadequate number of viral copies(<138 copies/mL). A negative result must be combined with clinical observations, patient history, and epidemiological information. The expected result is Negative.  Fact Sheet for Patients:  EntrepreneurPulse.com.au  Fact Sheet for Healthcare Providers:  IncredibleEmployment.be  This test is no t yet approved or cleared by the [place] FDA and  has been authorized for detection and/or diagnosis of SARS-CoV-2 by FDA under an Emergency Use Authorization (EUA). This EUA will remain  in effect (meaning this test can be used) for the duration of the COVID-19 declaration under Section 564(b)(1) of the Act, 21 U.S.C.section 360bbb-3(b)(1), unless the  authorization is terminated  or revoked sooner.       Influenza A by PCR NEGATIVE NEGATIVE Final   Influenza B by PCR NEGATIVE NEGATIVE Final    Comment: (NOTE) The Xpert Xpress SARS-CoV-2/FLU/RSV plus assay is intended as an aid in the diagnosis of influenza from Nasopharyngeal swab specimens and should not be used as a sole basis for treatment. Nasal washings and aspirates are unacceptable for Xpert Xpress SARS-CoV-2/FLU/RSV testing.  Fact Sheet for Patients: EntrepreneurPulse.com.au  Fact Sheet for Healthcare Providers: IncredibleEmployment.be  This test is not yet approved or cleared by the [place] FDA and has been authorized for detection and/or diagnosis of SARS-CoV-2 by FDA under an Emergency Use Authorization (EUA). This EUA will remain in effect (meaning this test can be used) for the duration of the COVID-19 declaration under Section 564(b)(1) of the Act, 21 U.S.C. section 360bbb-3(b)(1), unless the authorization is terminated or revoked.  Performed at [facility], [address] [place], [place] [address]     Lab Basic Metabolic Panel: Recent Labs  Lab 08/20/2020 1245 08/01/20 0513 08/02/20 0456 08/02/20 1445 08/03/20 0346  NA 153* 160* 157* 156* 157*  K 4.2 3.7 4.6 5.1 4.5  CL 117* 129* 127* 126* 128*  CO2 23 22 19* 17* 20*  GLUCOSE 682* 177* 352* 363* 324*  BUN 57* 54* 63* 61* 59*  CREATININE 2.69* 2.48* 2.64* 2.56* 2.18*  CALCIUM 9.5 8.6* 8.2* 8.2* 8.4*   Liver Function Tests: Recent Labs  Lab 08/21/2020 1245 08/01/20 0513 08/02/20 0456 08/03/20 0346  AST 141* 140* 102* 53*  ALT 93* 97* 112*  96*  ALKPHOS 73 54 65 70  BILITOT 0.8 0.7 1.1 0.9  PROT 7.7 6.4* 6.2* 6.3*  ALBUMIN 3.6 2.9* 2.7* 2.6*   No results for input(s): LIPASE, AMYLASE in the last 168 hours. No results for input(s): AMMONIA in the last 168 hours. CBC: Recent Labs  Lab 08/23/2020 1245 08/02/20 0456 08/03/20 0346  WBC  19.6* 16.1* 15.5*  NEUTROABS 16.3* 12.9* 12.5*  HGB 13.6 12.4* 13.1  HCT 44.0 41.2 43.6  MCV 88.7 94.1 93.0  PLT 324 176 165   Cardiac Enzymes: No results for input(s): CKTOTAL, CKMB, CKMBINDEX, TROPONINI in the last 168 hours. Sepsis Labs: Recent Labs  Lab 08/08/2020 1245 08/05/2020 1427 08/01/20 0513 08/02/20 0456 08/03/20 0346  PROCALCITON  --   --  0.43  --   --   WBC 19.6*  --   --  16.1* 15.5*  LATICACIDVEN 3.6* 2.9*  --   --   --     Procedures/Operations     Scheryl Sanborn Aug 24, 2020, 1:28 PM

## 2020-08-31 NOTE — Progress Notes (Signed)
Patient seen and examined this morning, had agonal breathing, unresponsive.  Called by RN.  Patient examined, auscultated and no pulse or respiration. Pronounced dead at 12:26 PM. Comforted patient's wife and family members at the bedside.   Estill Cotta M.D.  [facility] 2020-08-24, 12:42 PM

## 2020-08-31 NOTE — Consult Note (Signed)
Consultation Note Date: 2020-08-09   Patient Name: Joel Grant  DOB: 12/26/1926  MRN: [id number]  Age / Sex: 87 y.o., male   PCP: Jerrol Banana., MD Referring Physician: Mendel Corning, MD   REASON FOR CONSULTATION:Establishing goals of care  Palliative Care consult requested for goals of care discussion in this 87 y.o. male with a medical history significant for diabetes, psoriasis, lung cancer, and dementia.  Brought to the ER from home with concerns of altered mental status.  Only reported on admission patient has increased weakness and has become more nonverbal x2 days.  Work-up blood sugar 596.  Chest x-ray showed increased right upper lobe airspace disease.  CT of head showed no acute abnormalities.  SLP evaluated with recommendation for n.p.o. given continued aspiration.  Clinical Assessment and Goals of Care: I have reviewed medical records including lab results, imaging, Epic notes, and MAR, received report from the bedside RN, and assessed the patient.   I met at the bedside with patient's wife, son, and cousin to discuss diagnosis prognosis, GOC, EOL wishes, disposition and options.  Patient is unresponsive.  Appears comfortable in no acute distress.  Much more comfortable compared to yesterday with no audible secretions/congestion.   I introduced Palliative Medicine as specialized medical care for people living with serious illness. It focuses on providing relief from the symptoms and stress of a serious illness. The goal is to improve quality of life for both the patient and the family.  We discussed a brief life review of the patient, along with his functional and nutritional status.  Patient and wife have been married for more than 70 years.  They have 1 son.  Patient also was the only shell.  He worked for many years in the [employer].  Months prior to admission family reports patient was able to communicate needs and recognize family members however  several days prior to admission he became more nonverbal and seemed more confused.  He required assistance with ADLs.  Appetite waxed and waned with known aspiration risk.  Wife states appetite for several days prior to admission was very minimal.  We discussed His current illness and what it means in the larger context of His on-going co-morbidities. Natural disease trajectory and expectations at EOL were discussed.  Family has made decision for care to focus on patient's comfort with understanding prognosis is poor.  A detailed discussion regarding end-of-life and expectations.  We discussed symptom management and nonverbal signs of discomfort.  Family verbalized understanding and appreciation.  Hospice outpatient were explained and offered. Family verbalized their understanding and awareness of hospice's goals and philosophy of care.  Wife is requesting hospice home referral.  Education provided on the referral process.  Family is aware there is currently no bed availability with understanding patient will remain hospitalized and continue to receive care focused on his comfort until a bed becomes available.  Education also provided on possible hospital death prior to bed available at hospice.  Questions and concerns were addressed.The family was encouraged to call with questions or concerns.  PMT will continue to support holistically as needed.   CODE STATUS: DNR  ADVANCE DIRECTIVES: Primary Decision Maker: Next of kin  SYMPTOM MANAGEMENT: See below   Palliative Prophylaxis:   Aspiration, Eye Care, Frequent Pain Assessment, Oral Care and Turn Reposition  PSYCHO-SOCIAL/SPIRITUAL:  Support System: Family  Desire for further Chaplaincy support: No  Additional Recommendations (Limitations, Scope, Preferences):  Full Comfort Care  Education  on hospice/palliative    PAST MEDICAL HISTORY: Past Medical History:  Diagnosis Date  . 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 Dementia ([place])   . Diabetes mellitus without complication ([place])   . Dyspnea   . Pneumonia   . Psoriasis   . Tuberculosis    per wife as a child    ALLERGIES:  is allergic to ciprofloxacin, hydrocodone-acetaminophen, penicillins, and sulfa antibiotics.   MEDICATIONS:  Current Facility-Administered Medications  Medication Dose Route Frequency Provider Last Rate Last Admin  . 0.45 % sodium chloride infusion   Intravenous Continuous Rai, Ripudeep K, MD 50 mL/hr at 08-31-20 0317 New Bag at 08-31-20 0317  . acetaminophen (TYLENOL) tablet 650 mg  650 mg Oral Q6H PRN Agbata, Tochukwu, MD       Or  . acetaminophen (TYLENOL) suppository 650 mg  650 mg Rectal Q6H PRN Agbata, Tochukwu, MD      . cefTRIAXone (ROCEPHIN) 2 g in sodium chloride 0.9 % 100 mL IVPB  2 g Intravenous Q24H Agbata, Tochukwu, MD 200 mL/hr at 2020/08/31 0949 2 g at 08-31-20 0949  . glycopyrrolate (ROBINUL) injection 0.1 mg  0.1 mg Intravenous Q4H PRN Rai, Ripudeep K, MD   0.1 mg at August 31, 2020 8295  . LORazepam (ATIVAN) injection 0.5 mg  0.5 mg Intravenous Q6H PRN Rai, Ripudeep K, MD      . morphine 2 MG/ML injection 1 mg  1 mg Intravenous Q4H PRN Rai, Ripudeep K, MD      . ondansetron (ZOFRAN) tablet 4 mg  4 mg Oral Q6H PRN Agbata, Tochukwu, MD       Or  . ondansetron (ZOFRAN) injection 4 mg  4 mg Intravenous Q6H PRN Agbata, Tochukwu, MD        VITAL SIGNS: BP (!) 37/29   Pulse 69   Temp (!) 102.3 F (39.1 C)   Resp (!) 6   Ht $R'5\' 7"'ih$  (1.702 m)   Wt 81.8 kg   SpO2 (!) 89%   BMI 28.24 kg/m  Filed Weights   08/02/20 0449 08/03/20 0300 Aug 31, 2020 1226  Weight: 82.4 kg 81.8 kg 81.8 kg    Estimated body mass index is 28.24 kg/m as calculated from the following:   Height as of this encounter: $RemoveBeforeD'5\' 7"'YpmfvjUleQJWWi$  (1.702 m).   Weight as of this encounter: 81.8 kg.  LABS: CBC:    Component Value Date/Time   WBC 15.5 (H) 08/03/2020 0346   HGB 13.1 08/03/2020 0346   HGB 11.9 (L) 07/16/2019 1136   HCT 43.6  08/03/2020 0346   HCT 34.6 (L) 07/16/2019 1136   PLT 165 08/03/2020 0346   PLT 276 07/16/2019 1136   Comprehensive Metabolic Panel:    Component Value Date/Time   NA 157 (H) 08/03/2020 0346   NA 136 07/16/2019 1136   K 4.5 08/03/2020 0346   BUN 59 (H) 08/03/2020 0346   BUN 25 07/16/2019 1136   CREATININE 2.18 (H) 08/03/2020 0346   ALBUMIN 2.6 (L) 08/03/2020 0346   ALBUMIN 4.0 07/16/2019 1136     Review of Systems Unless otherwise noted, a complete review of systems is negative.  Physical Exam General: NAD, frail chronically-ill appearing Cardiovascular: Irregular Pulmonary: Diminished, rhonchi Abdomen: soft, nontender, + bowel sounds Extremities: no edema, no joint deformities Skin: no rashes, warm and dry Neurological: Unresponsive   Prognosis: Hours - Days  Discharge Planning:  Hospice facility  Recommendations: . DNR/DNI-as confirmed by son and wife . All  care to focus on comfort/EOL . Continued with ordered comfort care measures, PRN medications for symptom management . Hospice home referral. Family aware that there are no beds available. (TOC referral)  . PMT will continue to support and follow as needed. Please call team line with urgent needs.   Palliative Performance Scale: PPS 10%               Family expressed understanding and was in agreement with this plan.   Thank you for allowing the Palliative Medicine Team to assist in the care of this patient. Please utilize secure chat with additional questions, if there is no response within 30 minutes please call the above phone number.   Time In: 1110 Time Out: 1205 Time Total: 55 min.   Visit consisted of counseling and education dealing with the complex and emotionally intense issues of symptom management and palliative care in the setting of serious and potentially life-threatening illness.Greater than 50%  of this time was spent counseling and coordinating care related to the above assessment and  plan.  Signed by:  Alda Lea, AGPCNP-BC Palliative Medicine Team  Phone: [phone] Pager: [phone] Amion: [facility] Team providers are available by phone from 7am to 7pm daily and can be reached through the team cell phone.  Should this patient require assistance outside of these hours, please call the patient's attending physician.

## 2020-08-31 DEATH — deceased

## 2020-09-29 ENCOUNTER — Other Ambulatory Visit: Payer: Self-pay | Admitting: Family Medicine

## 2020-09-29 DIAGNOSIS — E119 Type 2 diabetes mellitus without complications: Secondary | ICD-10-CM
